# Patient Record
Sex: Female | Born: 1955 | Race: White | Hispanic: No | Marital: Married | Smoking: Never smoker
Health system: Southern US, Community
[De-identification: ages and names within clinical notes are randomized; demographics above are authoritative.]

## PROBLEM LIST (undated history)

## (undated) DIAGNOSIS — E785 Hyperlipidemia, unspecified: Secondary | ICD-10-CM

## (undated) DIAGNOSIS — R51 Headache: Secondary | ICD-10-CM

## (undated) DIAGNOSIS — K649 Unspecified hemorrhoids: Secondary | ICD-10-CM

## (undated) DIAGNOSIS — A770 Spotted fever due to Rickettsia rickettsii: Secondary | ICD-10-CM

## (undated) DIAGNOSIS — R519 Headache, unspecified: Secondary | ICD-10-CM

## (undated) DIAGNOSIS — G4700 Insomnia, unspecified: Secondary | ICD-10-CM

## (undated) DIAGNOSIS — R7309 Other abnormal glucose: Secondary | ICD-10-CM

## (undated) DIAGNOSIS — I10 Essential (primary) hypertension: Secondary | ICD-10-CM

## (undated) DIAGNOSIS — T7840XA Allergy, unspecified, initial encounter: Secondary | ICD-10-CM

## (undated) DIAGNOSIS — I472 Ventricular tachycardia: Secondary | ICD-10-CM

## (undated) HISTORY — DX: Hyperlipidemia, unspecified: E78.5

## (undated) HISTORY — DX: Essential (primary) hypertension: I10

## (undated) HISTORY — DX: Headache, unspecified: R51.9

## (undated) HISTORY — DX: Insomnia, unspecified: G47.00

## (undated) HISTORY — DX: Allergy, unspecified, initial encounter: T78.40XA

## (undated) HISTORY — DX: Headache: R51

## (undated) HISTORY — DX: Ventricular tachycardia: I47.2

## (undated) HISTORY — DX: Other abnormal glucose: R73.09

## (undated) HISTORY — DX: Unspecified hemorrhoids: K64.9

## (undated) HISTORY — DX: Spotted fever due to Rickettsia rickettsii: A77.0

## (undated) HISTORY — PX: COLONOSCOPY: SHX174

---

## 1985-05-08 HISTORY — PX: TUBAL LIGATION: SHX77

## 2004-10-27 ENCOUNTER — Ambulatory Visit: Payer: Self-pay

## 2005-10-19 ENCOUNTER — Ambulatory Visit: Payer: Self-pay | Admitting: Family Medicine

## 2006-04-26 ENCOUNTER — Ambulatory Visit: Payer: Self-pay

## 2006-05-08 LAB — HM COLONOSCOPY: HM Colonoscopy: NORMAL

## 2006-10-29 DIAGNOSIS — J309 Allergic rhinitis, unspecified: Secondary | ICD-10-CM | POA: Insufficient documentation

## 2006-10-29 DIAGNOSIS — I10 Essential (primary) hypertension: Secondary | ICD-10-CM | POA: Insufficient documentation

## 2006-10-29 DIAGNOSIS — E785 Hyperlipidemia, unspecified: Secondary | ICD-10-CM | POA: Insufficient documentation

## 2006-11-06 ENCOUNTER — Ambulatory Visit: Payer: Self-pay | Admitting: Family Medicine

## 2006-11-27 ENCOUNTER — Ambulatory Visit: Payer: Self-pay | Admitting: Gastroenterology

## 2009-03-22 ENCOUNTER — Ambulatory Visit: Payer: Self-pay | Admitting: Family Medicine

## 2011-11-06 ENCOUNTER — Ambulatory Visit: Payer: Self-pay | Admitting: Family Medicine

## 2011-11-07 LAB — HM MAMMOGRAPHY: HM Mammogram: NORMAL

## 2011-11-15 LAB — HM PAP SMEAR: HM Pap smear: NORMAL

## 2013-07-03 LAB — LIPID PANEL
Cholesterol: 200 mg/dL (ref 0–200)
HDL: 58 mg/dL (ref 35–70)
LDL Cholesterol: 124 mg/dL
Triglycerides: 89 mg/dL (ref 40–160)

## 2013-09-24 LAB — HEMOGLOBIN A1C: Hgb A1c MFr Bld: 5.8 % (ref 4.0–6.0)

## 2014-03-16 ENCOUNTER — Encounter: Payer: Self-pay | Admitting: *Deleted

## 2014-03-24 ENCOUNTER — Encounter: Payer: Self-pay | Admitting: General Surgery

## 2014-03-24 ENCOUNTER — Ambulatory Visit (INDEPENDENT_AMBULATORY_CARE_PROVIDER_SITE_OTHER): Payer: BC Managed Care – PPO | Admitting: General Surgery

## 2014-03-24 VITALS — BP 168/86 | HR 68 | Resp 12 | Ht 62.0 in | Wt 132.0 lb

## 2014-03-24 DIAGNOSIS — R599 Enlarged lymph nodes, unspecified: Secondary | ICD-10-CM

## 2014-03-24 DIAGNOSIS — R591 Generalized enlarged lymph nodes: Secondary | ICD-10-CM

## 2014-03-24 NOTE — Patient Instructions (Signed)
Patient to return in 6 weeks for follow up. The patient is aware to call back for any questions or concerns.  

## 2014-03-24 NOTE — Progress Notes (Signed)
Patient ID: Jodi Fernandez, female   DOB: 1955/12/15, 58 y.o.   MRN: [id number]  Chief Complaint  Patient presents with  . Other    lymphadenopathy axillary     HPI Jodi KaufmannJoan W Fernandez is a 58 y.o. female who presents for an evaluation of bilateral axillary lymphadenopathy. The patient states she noticed a hard knot in the left axilla approximately the middle of October. She states she went to her PCP the following week and was put on Antibiotics (Amoxicillin). She has completed the course of antibiotics and noticed a right axillary knot. The right knot was noticed on 03/15/14. She states the area will start to hurt and that's when she would notice the area. The left knot has decreased in size some.   HPI  Past Medical History  Diagnosis Date  . Hyperlipidemia   . Hypertension   . Hemorrhoids     Past Surgical History  Procedure Laterality Date  . Tubal ligation  1987    Family History  Problem Relation Age of Onset  . Heart disease Mother   . Diabetes Mother   . Alcohol abuse Father   . Heart disease Father   . Heart disease Sister     Social History History  Substance Use Topics  . Smoking status: Never Smoker   . Smokeless tobacco: Never Used  . Alcohol Use: No    No Known Allergies  Current Outpatient Prescriptions  Medication Sig Dispense Refill  . aspirin 325 MG tablet Take 325 mg by mouth daily.    . Coenzyme Q10 (CO Q 10 PO) Take 300 mg by mouth daily.    Marland Kitchen. ibuprofen (ADVIL,MOTRIN) 200 MG tablet Take 200 mg by mouth every 6 (six) hours as needed.    . Multiple Vitamins-Minerals (AIRBORNE PO) Take by mouth.    . Nutritional Supplements (JUICE PLUS FIBRE PO) Take by mouth daily.    . rosuvastatin (CRESTOR) 20 MG tablet 1/2 tab by mouth 3 times weekly    . telmisartan-hydrochlorothiazide (MICARDIS HCT) 40-12.5 MG per tablet Take 1 tablet by mouth daily.     No current facility-administered medications for this visit.    Review of Systems Review of Systems   Constitutional: Negative.   Respiratory: Negative.   Cardiovascular: Negative.     Blood pressure 168/86, pulse 68, resp. rate 12, height 5\' 2"  (1.575 m), weight 132 lb (59.875 kg).  Physical Exam Physical Exam  Constitutional: She is oriented to person, place, and time. She appears well-developed and well-nourished.  Neck: Neck supple. No thyromegaly present.  Cardiovascular: Normal rate, regular rhythm and normal heart sounds.   No murmur heard. Pulmonary/Chest: Effort normal and breath sounds normal.  Abdominal: Soft. Normal appearance and bowel sounds are normal. There is no hepatosplenomegaly. There is no tenderness. No hernia.  Lymphadenopathy:    She has no cervical adenopathy.    She has axillary adenopathy (right axillary node 1.5 to 2 cm).  Neurological: She is alert and oriented to person, place, and time.  Skin: Skin is warm and dry.    Data Reviewed    Assessment    Likely inflammatory nodes.      Plan    Recheck in 4-6 weeks. Pt to call if there is any redness, increased swelling , more pain.       Charmin Aguiniga G 03/25/2014, 6:48 AM

## 2014-03-25 ENCOUNTER — Encounter: Payer: Self-pay | Admitting: General Surgery

## 2014-03-30 ENCOUNTER — Telehealth: Payer: Self-pay

## 2014-03-30 NOTE — Telephone Encounter (Signed)
Patient called concerned about another lymph node swelling in her left axillary region. She says that this started on Friday evening. She states that she can still feel the one in her right side although it has reduced in size. She wants to know if she needs to be seen sooner than January or if she may need some blood work prior to being seen. She is asking for a call back about this.

## 2014-03-30 NOTE — Telephone Encounter (Signed)
Need to see her. Please schedule appt for Wed 11/25

## 2014-04-01 ENCOUNTER — Encounter: Payer: Self-pay | Admitting: General Surgery

## 2014-04-01 ENCOUNTER — Ambulatory Visit (INDEPENDENT_AMBULATORY_CARE_PROVIDER_SITE_OTHER): Payer: BC Managed Care – PPO | Admitting: General Surgery

## 2014-04-01 VITALS — BP 138/84 | HR 72 | Resp 12 | Ht 62.0 in | Wt 131.0 lb

## 2014-04-01 DIAGNOSIS — L729 Follicular cyst of the skin and subcutaneous tissue, unspecified: Secondary | ICD-10-CM

## 2014-04-01 NOTE — Patient Instructions (Addendum)
Continue self breast exams. Call office for any new breast issues or concerns. The patient is aware to call back for any questions or concerns. Redness, painful or swelling of the axillary areas.

## 2014-04-01 NOTE — Progress Notes (Signed)
Patient ID: Jodi KaufmannJoan W Elting, female   DOB: February 09, 1956, 58 y.o.   MRN: [id number]  Chief Complaint  Patient presents with  . Follow-up    left axillary area swollen    HPI Jodi KaufmannJoan W Gable is a 58 y.o. female who presents for an evaluation of left axillary lymphadenopathy. Patient states she still feels another knot near the other one that she just had in that area.    HPI  Past Medical History  Diagnosis Date  . Hyperlipidemia   . Hypertension   . Hemorrhoids     Past Surgical History  Procedure Laterality Date  . Tubal ligation  1987  . Colonoscopy      Family History  Problem Relation Age of Onset  . Heart disease Mother   . Diabetes Mother   . Alcohol abuse Father   . Heart disease Father   . Heart disease Sister     Social History History  Substance Use Topics  . Smoking status: Never Smoker   . Smokeless tobacco: Never Used  . Alcohol Use: No    No Known Allergies  Current Outpatient Prescriptions  Medication Sig Dispense Refill  . aspirin 325 MG tablet Take 325 mg by mouth daily.    . Coenzyme Q10 (CO Q 10 PO) Take 300 mg by mouth daily.    Marland Kitchen. ibuprofen (ADVIL,MOTRIN) 200 MG tablet Take 200 mg by mouth every 6 (six) hours as needed.    . Multiple Vitamins-Minerals (AIRBORNE PO) Take by mouth.    . Nutritional Supplements (JUICE PLUS FIBRE PO) Take by mouth daily.    . rosuvastatin (CRESTOR) 20 MG tablet 1/2 tab by mouth 3 times weekly    . telmisartan-hydrochlorothiazide (MICARDIS HCT) 40-12.5 MG per tablet Take 1 tablet by mouth daily.     No current facility-administered medications for this visit.    Review of Systems Review of Systems  Constitutional: Negative.   Respiratory: Negative.   Cardiovascular: Negative.     Blood pressure 138/84, pulse 72, resp. rate 12, height 5\' 2"  (1.575 m), weight 131 lb (59.421 kg).  Physical Exam Physical Exam  Constitutional: She is oriented to person, place, and time. She appears well-developed and  well-nourished.  Lymphadenopathy:    She has no axillary adenopathy.  Neurological: She is alert and oriented to person, place, and time.  Skin: Skin is warm, dry and intact.  6 mm skin cyst in left axillary crease. Tiny skin cyst right axilla.   No palpable nodes in neck or axilla today Data Reviewed none  Assessment    Bilateral axillary skin cyst. Pt advised on this. Reassured that there are no palpable nodes.    Plan    Follow up in January as scheduled. Advised to call if redness, pain or swelling develops at the skin cyst area in axilla       PCP:  Charlott HollerSowles, Krichna   Shalah Estelle G 04/01/2014, 9:45 AM

## 2014-04-11 DIAGNOSIS — Z8619 Personal history of other infectious and parasitic diseases: Secondary | ICD-10-CM | POA: Insufficient documentation

## 2014-05-14 ENCOUNTER — Ambulatory Visit: Payer: BC Managed Care – PPO | Admitting: General Surgery

## 2014-05-14 ENCOUNTER — Encounter: Payer: Self-pay | Admitting: *Deleted

## 2014-11-11 ENCOUNTER — Encounter: Payer: Self-pay | Admitting: Family Medicine

## 2014-11-11 DIAGNOSIS — G4700 Insomnia, unspecified: Secondary | ICD-10-CM | POA: Insufficient documentation

## 2014-11-11 DIAGNOSIS — IMO0002 Reserved for concepts with insufficient information to code with codable children: Secondary | ICD-10-CM | POA: Insufficient documentation

## 2014-11-11 DIAGNOSIS — F411 Generalized anxiety disorder: Secondary | ICD-10-CM | POA: Insufficient documentation

## 2014-11-11 DIAGNOSIS — M7060 Trochanteric bursitis, unspecified hip: Secondary | ICD-10-CM | POA: Insufficient documentation

## 2014-11-11 DIAGNOSIS — R51 Headache: Secondary | ICD-10-CM

## 2014-11-11 DIAGNOSIS — R590 Localized enlarged lymph nodes: Secondary | ICD-10-CM | POA: Insufficient documentation

## 2014-11-11 DIAGNOSIS — R519 Headache, unspecified: Secondary | ICD-10-CM | POA: Insufficient documentation

## 2014-11-12 ENCOUNTER — Encounter: Payer: Self-pay | Admitting: Family Medicine

## 2014-11-12 ENCOUNTER — Other Ambulatory Visit: Payer: Self-pay | Admitting: Family Medicine

## 2014-11-12 ENCOUNTER — Ambulatory Visit (INDEPENDENT_AMBULATORY_CARE_PROVIDER_SITE_OTHER): Payer: BC Managed Care – PPO | Admitting: Family Medicine

## 2014-11-12 VITALS — BP 138/84 | HR 86 | Temp 98.0°F | Resp 14 | Ht 62.0 in | Wt 137.4 lb

## 2014-11-12 DIAGNOSIS — I10 Essential (primary) hypertension: Secondary | ICD-10-CM

## 2014-11-12 DIAGNOSIS — Z1239 Encounter for other screening for malignant neoplasm of breast: Secondary | ICD-10-CM

## 2014-11-12 DIAGNOSIS — Z114 Encounter for screening for human immunodeficiency virus [HIV]: Secondary | ICD-10-CM | POA: Diagnosis not present

## 2014-11-12 DIAGNOSIS — E785 Hyperlipidemia, unspecified: Secondary | ICD-10-CM

## 2014-11-12 DIAGNOSIS — F419 Anxiety disorder, unspecified: Secondary | ICD-10-CM

## 2014-11-12 DIAGNOSIS — R739 Hyperglycemia, unspecified: Secondary | ICD-10-CM

## 2014-11-12 DIAGNOSIS — G4700 Insomnia, unspecified: Secondary | ICD-10-CM | POA: Diagnosis not present

## 2014-11-12 MED ORDER — TELMISARTAN 20 MG PO TABS: 20.0000 mg | ORAL_TABLET | Freq: Every day | ORAL | Status: DC

## 2014-11-12 MED ORDER — ASPIRIN EC 81 MG PO TBEC: 81.0000 mg | DELAYED_RELEASE_TABLET | Freq: Every day | ORAL | Status: DC

## 2014-11-12 MED ORDER — ROSUVASTATIN CALCIUM 5 MG PO TABS: 5.0000 mg | ORAL_TABLET | Freq: Every day | ORAL | Status: DC

## 2014-11-12 NOTE — Progress Notes (Signed)
Name: Jodi Fernandez   MRN: [id number]    DOB: June 10, 1955   Date:11/12/2014       Progress Note  Subjective  Chief Complaint  Chief Complaint  Patient presents with  . Hypertension    headaches  . Hyperlipidemia    cramps in leg  . Tick Removal    but no syptoms    HPI  HTN: taking half dose of Micardis/HCTZ and bp has been at goal, sometimes dropping to below 100/70 and gets dizzy when walking. BP usually below 125/85. She denies chest pain, or palpitation. She did not take medication this am, she is fasting for labs.   Hyperlipidemia: she has been taking 10mg  Crestor a few days weekly, and CoQ 10 supplements. She denies myalgias. She noticed some calf cramps at night this Summer. Advised to stretch before bed and stay hydrated.  Tick exposure: she found three ticks on her on April 27th, no symptoms, feeling well.   Insomnia: she retired in January 2016, she is taking care of two grandchildren a 472 and 61 year old. She has been sleeping better, sometimes she wakes up at night, but mostly because she feels hot or because her husband asks for assistance.  Anxiety: she still struggles with anxiety, she worries about her grandchildren, afraid they will get hurt, also does not like being a passenger in a car.   Patient Active Problem List   Diagnosis Date Noted  . Anxiety 11/11/2014  . Insomnia, persistent 11/11/2014  . Dyspareunia 11/11/2014  . Generalized headache 11/11/2014  . Bursitis, trochanteric 11/11/2014  . History of Rocky [place] spotted fever 04/11/2014  . Dyslipidemia 10/29/2006  . Benign hypertension 10/29/2006  . Allergic rhinitis 10/29/2006    Past Surgical History  Procedure Laterality Date  . Tubal ligation  1987  . Colonoscopy      Family History  Problem Relation Age of Onset  . Heart disease Mother   . Diabetes Mother   . Alcohol abuse Father   . Heart disease Father   . Heart disease Sister   . Hypertension Brother   . Hypertension Brother   .  Hypertension Brother   . Hypertension Brother     History   Social History  . Marital Status: Married    Spouse Name: N/A  . Number of Children: N/A  . Years of Education: N/A   Occupational History  . Not on file.   Social History Main Topics  . Smoking status: Never Smoker   . Smokeless tobacco: Never Used  . Alcohol Use: No  . Drug Use: No  . Sexual Activity:    Partners: Female   Other Topics Concern  . Not on file   Social History Narrative     Current outpatient prescriptions:  .  Coenzyme Q10 (CO Q 10 PO), Take 300 mg by mouth daily., Disp: , Rfl:  .  ibuprofen (ADVIL,MOTRIN) 200 MG tablet, Take 200 mg by mouth every 6 (six) hours as needed., Disp: , Rfl:  .  Multiple Vitamins-Minerals (AIRBORNE PO), Take by mouth., Disp: , Rfl:  .  Nutritional Supplements (JUICE PLUS FIBRE PO), Take by mouth daily., Disp: , Rfl:  .  aspirin EC 81 MG tablet, Take 1 tablet (81 mg total) by mouth daily., Disp: 30 tablet, Rfl: 5 .  rosuvastatin (CRESTOR) 5 MG tablet, Take 1 tablet (5 mg total) by mouth daily., Disp: 30 tablet, Rfl: 5 .  telmisartan (MICARDIS) 20 MG tablet, Take 1 tablet (20 mg total)  by mouth daily., Disp: 30 tablet, Rfl: 5  No Known Allergies   ROS  Constitutional: Negative for fever or weight change.  Respiratory: Negative for cough and shortness of breath.   Cardiovascular: Negative for chest pain or palpitations.  Gastrointestinal: Negative for abdominal pain, no bowel changes.  Musculoskeletal: Negative for gait problem or joint swelling.  Skin: Negative for rash.  Neurological: Negative for dizziness or headache.  No other specific complaints in a complete review of systems (except as listed in HPI above).  Objective  Filed Vitals:   11/12/14 0814  BP: 138/84  Pulse: 86  Temp: 98 F (36.7 C)  TempSrc: Oral  Resp: 14  Height:  (1.575 m)  Weight: 137 lb 6.4 oz (62.324 kg)  SpO2: 96%    Body mass index is 25.12 kg/(m^2).  Physical  Exam  Constitutional: Patient appears well-developed and well-nourished. No distress.  Eyes:  No scleral icterus. PERL Neck: Normal range of motion. Neck supple. Cardiovascular: Normal rate, regular rhythm and normal heart sounds.  No murmur heard. No BLE edema. Pulmonary/Chest: Effort normal and breath sounds normal. No respiratory distress. Abdominal: Soft.  There is no tenderness. Psychiatric: Patient has a normal mood and affect. behavior is normal. Judgment and thought content normal.    PHQ2/9: Depression screen PHQ 2/9 11/12/2014  Decreased Interest 0  Down, Depressed, Hopeless 0  PHQ - 2 Score 0     Fall Risk: Fall Risk  11/12/2014  Falls in the past year? No     Assessment & Plan  1. Benign hypertension   Because of low blood pressure readings at home, we will change dose of medication and continue to monitor at home - telmisartan (MICARDIS) 20 MG tablet; Take 1 tablet (20 mg total) by mouth daily.  Dispense: 30 tablet; Refill: 5 - Comprehensive Metabolic Panel (CMET)  2. Dyslipidemia  Recheck labs, change to  daily instead of half three times weekly  - rosuvastatin (CRESTOR) 5 MG tablet; Take 1 tablet (5 mg total) by mouth daily.  Dispense: 30 tablet; Refill: 5 - Lipid Profile  3. Insomnia, persistent Stable, discussed Melatonin   4. Anxiety Stable, she does not want medications at this time  5. Encounter for screening for HIV  - HIV antibody (with reflex)  7. Breast cancer screening  - MM Digital Screening; Future  7. Hyperglycemia Glucose at home, with husband machine was 120 fasting, we will check hgbA1C  -hgbA1C

## 2014-11-13 LAB — COMPREHENSIVE METABOLIC PANEL
ALT: 9 IU/L (ref 0–32)
AST: 21 IU/L (ref 0–40)
Albumin/Globulin Ratio: 1.9 (ref 1.1–2.5)
Albumin: 4.8 g/dL (ref 3.6–4.8)
Alkaline Phosphatase: 90 IU/L (ref 39–117)
BUN/Creatinine Ratio: 17 (ref 11–26)
BUN: 12 mg/dL (ref 8–27)
Bilirubin Total: 0.4 mg/dL (ref 0.0–1.2)
CO2: 25 mmol/L (ref 18–29)
Calcium: 9.6 mg/dL (ref 8.7–10.3)
Chloride: 102 mmol/L (ref 97–108)
Creatinine, Ser: 0.7 mg/dL (ref 0.57–1.00)
GFR calc Af Amer: 109 mL/min/{1.73_m2} (ref >59)
GFR calc non Af Amer: 94 mL/min/{1.73_m2} (ref >59)
Globulin, Total: 2.5 g/dL (ref 1.5–4.5)
Glucose: 117 mg/dL — ABNORMAL HIGH (ref 65–99)
Potassium: 4.1 mmol/L (ref 3.5–5.2)
Sodium: 142 mmol/L (ref 134–144)
Total Protein: 7.3 g/dL (ref 6.0–8.5)

## 2014-11-13 LAB — HEMOGLOBIN A1C
Est. average glucose Bld gHb Est-mCnc: 120 mg/dL
Hgb A1c MFr Bld: 5.8 % — ABNORMAL HIGH (ref 4.8–5.6)

## 2014-11-13 LAB — LIPID PANEL
Chol/HDL Ratio: 4.3 ratio units (ref 0.0–4.4)
Cholesterol, Total: 250 mg/dL — ABNORMAL HIGH (ref 100–199)
HDL: 58 mg/dL (ref >39)
LDL Calculated: 167 mg/dL — ABNORMAL HIGH (ref 0–99)
Triglycerides: 124 mg/dL (ref 0–149)
VLDL Cholesterol Cal: 25 mg/dL (ref 5–40)

## 2014-11-13 LAB — HIV ANTIBODY (ROUTINE TESTING W REFLEX): HIV Screen 4th Generation wRfx: NONREACTIVE

## 2015-02-15 ENCOUNTER — Ambulatory Visit (INDEPENDENT_AMBULATORY_CARE_PROVIDER_SITE_OTHER): Payer: BC Managed Care – PPO | Admitting: Family Medicine

## 2015-02-15 ENCOUNTER — Encounter: Payer: Self-pay | Admitting: Family Medicine

## 2015-02-15 VITALS — BP 132/82 | HR 84 | Temp 98.1°F | Resp 14 | Ht 62.0 in | Wt 139.3 lb

## 2015-02-15 DIAGNOSIS — Z719 Counseling, unspecified: Secondary | ICD-10-CM

## 2015-02-15 DIAGNOSIS — Z7189 Other specified counseling: Secondary | ICD-10-CM | POA: Diagnosis not present

## 2015-02-15 DIAGNOSIS — Z23 Encounter for immunization: Secondary | ICD-10-CM | POA: Diagnosis not present

## 2015-02-15 DIAGNOSIS — Z0000 Encounter for general adult medical examination without abnormal findings: Secondary | ICD-10-CM | POA: Diagnosis not present

## 2015-02-15 DIAGNOSIS — Z1239 Encounter for other screening for malignant neoplasm of breast: Secondary | ICD-10-CM

## 2015-02-15 DIAGNOSIS — Z01419 Encounter for gynecological examination (general) (routine) without abnormal findings: Secondary | ICD-10-CM

## 2015-02-15 DIAGNOSIS — Z124 Encounter for screening for malignant neoplasm of cervix: Secondary | ICD-10-CM | POA: Diagnosis not present

## 2015-02-15 NOTE — Progress Notes (Addendum)
Name: Jodi Fernandez   MRN: [id number]    DOB: April 07, 1956   Date:02/15/2015       Progress Note  Subjective  Chief Complaint  Chief Complaint  Patient presents with  . Annual Exam    HPI  Well Woman Exam: patient is feeling well, hot flashes are mild and intermittent. She has vaginal dryness and some dyspareunia. She also has occasional urinary stress incontinence - but only with severe coughing spells. She is not working on Kegel exercises.   Patient Active Problem List   Diagnosis Date Noted  . Anxiety 11/11/2014  . Insomnia, persistent 11/11/2014  . Dyspareunia 11/11/2014  . Generalized headache 11/11/2014  . Bursitis, trochanteric 11/11/2014  . History of Rocky Mountain spotted fever 04/11/2014  . Dyslipidemia 10/29/2006  . Benign hypertension 10/29/2006  . Allergic rhinitis 10/29/2006    Past Surgical History  Procedure Laterality Date  . Tubal ligation  1987  . Colonoscopy      Family History  Problem Relation Age of Onset  . Heart disease Mother   . Diabetes Mother   . Alcohol abuse Father   . Heart disease Father   . Heart disease Sister   . Hypertension Brother   . Hypertension Brother   . Hypertension Brother   . Hypertension Brother     Social History   Social History  . Marital Status: Married    Spouse Name: N/A  . Number of Children: N/A  . Years of Education: N/A   Occupational History  . Not on file.   Social History Main Topics  . Smoking status: Never Smoker   . Smokeless tobacco: Never Used  . Alcohol Use: No  . Drug Use: No  . Sexual Activity:    Partners: Female   Other Topics Concern  . Not on file   Social History Narrative     Current outpatient prescriptions:  .  aspirin EC 81 MG tablet, Take 1 tablet (81 mg total) by mouth daily., Disp: 30 tablet, Rfl: 5 .  Coenzyme Q10 (CO Q 10 PO), Take 300 mg by mouth daily., Disp: , Rfl:  .  ibuprofen (ADVIL,MOTRIN) 200 MG tablet, Take 200 mg by mouth every 6 (six) hours as  needed., Disp: , Rfl:  .  Multiple Vitamins-Minerals (AIRBORNE PO), Take by mouth., Disp: , Rfl:  .  Nutritional Supplements (JUICE PLUS FIBRE PO), Take by mouth daily., Disp: , Rfl:  .  rosuvastatin (CRESTOR) 5 MG tablet, Take 1 tablet (5 mg total) by mouth daily., Disp: 30 tablet, Rfl: 5 .  telmisartan (MICARDIS) 20 MG tablet, Take 1 tablet (20 mg total) by mouth daily., Disp: 30 tablet, Rfl: 5  No Known Allergies   ROS  Constitutional: Negative for fever or weight change.  Respiratory: Negative for cough and shortness of breath.   Cardiovascular: Negative for chest pain or palpitations.  Gastrointestinal: Negative for abdominal pain, no bowel changes.  Musculoskeletal: Negative for gait problem or joint swelling.  Skin: Negative for rash.  Neurological: Negative for dizziness or headache.  No other specific complaints in a complete review of systems (except as listed in HPI above).  Objective  Filed Vitals:   02/15/15 0857  BP: 132/82  Pulse: 84  Temp: 98.1 F (36.7 C)  TempSrc: Oral  Resp: 14  Height:  (1.575 m)  Weight: 139 lb 4.8 oz (63.186 kg)  SpO2: 96%    Body mass index is 25.47 kg/(m^2).  Physical Exam  Constitutional: Patient appears well-developed  and well-nourished. No distress.  HENT: Head: Normocephalic and atraumatic. Ears: B TMs ok, no erythema or effusion; Nose: Nose normal. Mouth/Throat: Oropharynx is clear and moist. No oropharyngeal exudate.  Eyes: Conjunctivae and EOM are normal. Pupils are equal, round, and reactive to light. No scleral icterus.  Neck: Normal range of motion. Neck supple. No JVD present. No thyromegaly present.  Cardiovascular: Normal rate, regular rhythm and normal heart sounds.  No murmur heard. No BLE edema. Pulmonary/Chest: Effort normal and breath sounds normal. No respiratory distress. Abdominal: Soft. Bowel sounds are normal, no distension. There is no tenderness. no masses Breast: no lumps or masses, no nipple  discharge or rashes FEMALE GENITALIA:  External genitalia normal External urethra normal Vaginal atrophy Cervix normal without discharge or lesions Bimanual exam normal without masses RECTAL: not done Musculoskeletal: Normal range of motion, no joint effusions. No gross deformities Neurological: he is alert and oriented to person, place, and time. No cranial nerve deficit. Coordination, balance, strength, speech and gait are normal.  Skin: Skin is warm and dry. No rash noted. No erythema.  Psychiatric: Patient has a normal mood and affect. behavior is normal. Judgment and thought content normal.  PHQ2/9: Depression screen [facility] 2/9 02/15/2015 11/12/2014  Decreased Interest 0 0  Down, Depressed, Hopeless 1 0  PHQ - 2 Score 1 0    Fall Risk: Fall Risk  02/15/2015 11/12/2014  Falls in the past year? No No    Functional Status Survey: Is the patient deaf or have difficulty hearing?: No Does the patient have difficulty seeing, even when wearing glasses/contacts?: Yes (glases reading) Does the patient have difficulty concentrating, remembering, or making decisions?: No Does the patient have difficulty walking or climbing stairs?: No Does the patient have difficulty dressing or bathing?: No Does the patient have difficulty doing errands alone such as visiting a doctor's office or shopping?: No    Assessment & Plan  1. Well woman exam   2. Needs flu shot  - Flu Vaccine QUAD 36+ mos PF IM (Fluarix & Fluzone Quad PF)  3. Health counseling  Discussed importance of 150 minutes of physical activity weekly, eat two servings of fish weekly, eat one serving of tree nuts ( cashews, pistachios, pecans, almonds.Marland Kitchen) every other day, eat 6 servings of fruit/vegetables daily and drink plenty of water and avoid sweet beverages.   4. Need for shingles vaccine  - Varicella-zoster vaccine subcutaneous  5. Breast cancer screening  She will stop at [facility] to schedule mammogram today  6.  Cervical cancer screening  - PapLb, HPV, rfx16/18

## 2015-02-15 NOTE — Addendum Note (Signed)
Addended by: Alba Cory F on: 02/15/2015 10:03 AM   Modules accepted: Orders, SmartSet

## 2015-02-18 ENCOUNTER — Ambulatory Visit
Admission: RE | Admit: 2015-02-18 | Discharge: 2015-02-18 | Disposition: A | Discharge status: Home / Self Care | Payer: BC Managed Care – PPO | Source: Ambulatory Visit | Attending: Family Medicine | Admitting: Family Medicine

## 2015-02-18 DIAGNOSIS — Z1239 Encounter for other screening for malignant neoplasm of breast: Secondary | ICD-10-CM

## 2015-02-18 DIAGNOSIS — Z1231 Encounter for screening mammogram for malignant neoplasm of breast: Secondary | ICD-10-CM | POA: Diagnosis not present

## 2015-02-18 LAB — PAPLB, HPV, RFX16/18: PAP Smear Comment: 0

## 2015-05-17 ENCOUNTER — Ambulatory Visit: Payer: BC Managed Care – PPO | Admitting: Family Medicine

## 2015-05-20 ENCOUNTER — Encounter: Payer: Self-pay | Admitting: Family Medicine

## 2015-05-20 ENCOUNTER — Ambulatory Visit (INDEPENDENT_AMBULATORY_CARE_PROVIDER_SITE_OTHER): Payer: BC Managed Care – PPO | Admitting: Family Medicine

## 2015-05-20 VITALS — BP 146/84 | HR 85 | Temp 98.7°F | Resp 16 | Wt 141.0 lb

## 2015-05-20 DIAGNOSIS — R7303 Prediabetes: Secondary | ICD-10-CM

## 2015-05-20 DIAGNOSIS — G4700 Insomnia, unspecified: Secondary | ICD-10-CM | POA: Diagnosis not present

## 2015-05-20 DIAGNOSIS — I10 Essential (primary) hypertension: Secondary | ICD-10-CM | POA: Diagnosis not present

## 2015-05-20 DIAGNOSIS — E785 Hyperlipidemia, unspecified: Secondary | ICD-10-CM

## 2015-05-20 DIAGNOSIS — F411 Generalized anxiety disorder: Secondary | ICD-10-CM | POA: Diagnosis not present

## 2015-05-20 MED ORDER — ROSUVASTATIN CALCIUM 5 MG PO TABS: 5.0000 mg | ORAL_TABLET | Freq: Every day | ORAL | Status: DC

## 2015-05-20 MED ORDER — TELMISARTAN-HCTZ 40-12.5 MG PO TABS: 1.0000 | ORAL_TABLET | Freq: Every day | ORAL | Status: DC

## 2015-05-20 NOTE — Progress Notes (Signed)
Name: Jodi Fernandez   MRN: [id number]    DOB: 02-02-1956   Date:05/20/2015       Progress Note  Subjective  Chief Complaint  Chief Complaint  Patient presents with  . Hypertension    Patient has been checking her BP daily and brought her log. She stated that it has been running high.   . Hyperlipidemia  . Medication Management    Patient is unsure if her BP meds need to be changed again.     HPI  HTN: Micardis was changed to a lower dose in July 2016 because she was having hypotension, however over the past few months bp has been very high, 121-158 ( average 130's )/ 81-103 average 90's/. She has changed her diet lately - not as healthy and has gained weight.  She denies chest pain, no palpitation, HR can go down to 60's.   Hyperlipidemia: she has been taking 5mg  Crestor , and CoQ 10 supplements. She denies myalgias. No cramps.    Insomnia: she retired in January 2016, she is taking care of two grandchildren a 62 and 33 year old. She still has problems sleeping, she wakes up at night, but she can fall back asleep. Explained likely from anxiety, but she does not want medication   Anxiety: she still struggles with anxiety, she worries about her grandchildren, afraid they will get hurt, also does not like being a passenger in a car. She also gets nervous when driving a different. Gets annoyed with husband because he works from home at times and is not very organized.   Patient Active Problem List   Diagnosis Date Noted  . GAD (generalized anxiety disorder) 11/11/2014  . Insomnia, persistent 11/11/2014  . Dyspareunia 11/11/2014  . Generalized headache 11/11/2014  . Bursitis, trochanteric 11/11/2014  . History of Rocky [place] spotted fever 04/11/2014  . Dyslipidemia 10/29/2006  . Benign hypertension 10/29/2006  . Allergic rhinitis 10/29/2006    Past Surgical History  Procedure Laterality Date  . Tubal ligation  1987  . Colonoscopy      Family History  Problem Relation Age of  Onset  . Heart disease Mother   . Diabetes Mother   . Alcohol abuse Father   . Heart disease Father   . Heart disease Sister   . Hypertension Brother   . Hypertension Brother   . Hypertension Brother   . Hypertension Brother   . Breast cancer Paternal Aunt 60    Social History   Social History  . Marital Status: Married    Spouse Name: N/A  . Number of Children: N/A  . Years of Education: N/A   Occupational History  . Not on file.   Social History Main Topics  . Smoking status: Never Smoker   . Smokeless tobacco: Never Used  . Alcohol Use: No  . Drug Use: No  . Sexual Activity:    Partners: Female   Other Topics Concern  . Not on file   Social History Narrative     Current outpatient prescriptions:  .  aspirin EC 81 MG tablet, Take 1 tablet (81 mg total) by mouth daily., Disp: 30 tablet, Rfl: 5 .  Coenzyme Q10 (CO Q 10 PO), Take 300 mg by mouth daily., Disp: , Rfl:  .  Multiple Vitamins-Minerals (AIRBORNE PO), Take by mouth., Disp: , Rfl:  .  Nutritional Supplements (JUICE PLUS FIBRE PO), Take by mouth daily., Disp: , Rfl:  .  rosuvastatin (CRESTOR) 5 MG tablet, Take 1 tablet (  5 mg total) by mouth daily., Disp: 30 tablet, Rfl: 5 .  telmisartan (MICARDIS) 20 MG tablet, Take 1 tablet (20 mg total) by mouth daily., Disp: 30 tablet, Rfl: 5 .  ibuprofen (ADVIL,MOTRIN) 200 MG tablet, Take 200 mg by mouth every 6 (six) hours as needed. Reported on 05/20/2015, Disp: , Rfl:   No Known Allergies   ROS  Constitutional: Negative for fever or weight change.  Respiratory: Negative for cough and shortness of breath.   Cardiovascular: Negative for chest pain or palpitations.  Gastrointestinal: Negative for abdominal pain, no bowel changes.  Musculoskeletal: Negative for gait problem or joint swelling.  Skin: Negative for rash.  Neurological: Negative for dizziness or headache.  No other specific complaints in a complete review of systems (except as listed in HPI  above).  Objective  Filed Vitals:   05/20/15 0938  BP: 148/82  Pulse: 85  Temp: 98.7 F (37.1 C)  TempSrc: Oral  Resp: 16  Weight: 141 lb (63.957 kg)  SpO2: 98%    Body mass index is 25.78 kg/(m^2).  Physical Exam  Constitutional: Patient appears well-developed and well-nourished. Obese  No distress.  HEENT: head atraumatic, normocephalic, pupils equal and reactive to light, neck supple, throat within normal limits Cardiovascular: Normal rate, regular rhythm and normal heart sounds.  No murmur heard. No BLE edema. Pulmonary/Chest: Effort normal and breath sounds normal. No respiratory distress. Abdominal: Soft.  There is no tenderness. Psychiatric: Patient has a normal mood and affect. behavior is normal. Judgment and thought content normal.  PHQ2/9: Depression screen [facility] 2/9 05/20/2015 02/15/2015 11/12/2014  Decreased Interest 0 0 0  Down, Depressed, Hopeless 1 1 0  PHQ - 2 Score 1 1 0    Fall Risk: Fall Risk  05/20/2015 02/15/2015 11/12/2014  Falls in the past year? No No No    Functional Status Survey: Is the patient deaf or have difficulty hearing?: No Does the patient have difficulty seeing, even when wearing glasses/contacts?: No Does the patient have difficulty concentrating, remembering, or making decisions?: No Does the patient have difficulty walking or climbing stairs?: No Does the patient have difficulty dressing or bathing?: No Does the patient have difficulty doing errands alone such as visiting a doctor's office or shopping?: No  GAD 7 : Generalized Anxiety Score 05/20/2015  Nervous, Anxious, on Edge 3  Control/stop worrying 2  Worry too much - different things 3  Trouble relaxing 1  Restless 0  Easily annoyed or irritable 3  Afraid - awful might happen 3  Total GAD 7 Score 15  Anxiety Difficulty Somewhat difficult     Assessment & Plan   1. Benign hypertension  Resume higher dose of Micardis/HCTZ, she will send me a log of her bp readings, she  will start exercising again and resume a healthy diet, she will also try taking 40 mg of Micardis for the next 2 weeks followed by one of Micardis HCTZ after that and let me know what works best for her. Return sooner if needed.  - telmisartan-hydrochlorothiazide (MICARDIS HCT) 40-12.5 MG tablet; Take 1 tablet by mouth daily.  Dispense: 30 tablet; Refill: 0  2. Dyslipidemia  - rosuvastatin (CRESTOR) 5 MG tablet; Take 1 tablet (5 mg total) by mouth daily.  Dispense: 30 tablet; Refill: 5  3. Insomnia, persistent  She is a light sleeper and wakes up during the night, but does not want to take medication    4. Prediabetes  Discussed dietary changes and exercise, last hgbA1C 5.8%  5. GAD (generalized anxiety disorder)  She refuses to take medication for anxiety, discussed counseling

## 2015-05-20 NOTE — Patient Instructions (Signed)
Generalized Anxiety Disorder Generalized anxiety disorder (GAD) is a mental disorder. It interferes with life functions, including relationships, work, and school. GAD is different from normal anxiety, which everyone experiences at some point in their lives in response to specific life events and activities. Normal anxiety actually helps us prepare for and get through these life events and activities. Normal anxiety goes away after the event or activity is over.  GAD causes anxiety that is not necessarily related to specific events or activities. It also causes excess anxiety in proportion to specific events or activities. The anxiety associated with GAD is also difficult to control. GAD can vary from mild to severe. People with severe GAD can have intense waves of anxiety with physical symptoms (panic attacks).  SYMPTOMS The anxiety and worry associated with GAD are difficult to control. This anxiety and worry are related to many life events and activities and also occur more days than not for 6 months or longer. People with GAD also have three or more of the following symptoms (one or more in children):  Restlessness.   Fatigue.  Difficulty concentrating.   Irritability.  Muscle tension.  Difficulty sleeping or unsatisfying sleep. DIAGNOSIS GAD is diagnosed through an assessment by your health care provider. Your health care provider will ask you questions aboutyour mood,physical symptoms, and events in your life. Your health care provider may ask you about your medical history and use of alcohol or drugs, including prescription medicines. Your health care provider may also do a physical exam and blood tests. Certain medical conditions and the use of certain substances can cause symptoms similar to those associated with GAD. Your health care provider may refer you to a mental health specialist for further evaluation. TREATMENT The following therapies are usually used to treat GAD:    Medication. Antidepressant medication usually is prescribed for long-term daily control. Antianxiety medicines may be added in severe cases, especially when panic attacks occur.   Talk therapy (psychotherapy). Certain types of talk therapy can be helpful in treating GAD by providing support, education, and guidance. A form of talk therapy called cognitive behavioral therapy can teach you healthy ways to think about and react to daily life events and activities.  Stress managementtechniques. These include yoga, meditation, and exercise and can be very helpful when they are practiced regularly. A mental health specialist can help determine which treatment is best for you. Some people see improvement with one therapy. However, other people require a combination of therapies.   This information is not intended to replace advice given to you by your health care provider. Make sure you discuss any questions you have with your health care provider.   Document Released: 08/19/2012 Document Revised: 05/15/2014 Document Reviewed: 08/19/2012 Elsevier Interactive Patient Education 2016 [employer] Inc.  

## 2015-06-25 ENCOUNTER — Telehealth: Payer: Self-pay

## 2015-06-25 DIAGNOSIS — I10 Essential (primary) hypertension: Secondary | ICD-10-CM

## 2015-06-25 MED ORDER — TELMISARTAN-HCTZ 40-12.5 MG PO TABS: 1.0000 | ORAL_TABLET | Freq: Every day | ORAL | Status: DC

## 2015-06-25 NOTE — Telephone Encounter (Signed)
done

## 2015-06-25 NOTE — Telephone Encounter (Signed)
Patient wanted to call and informed you the increased in the Telmisartan-HCTZ is working well for the patient. Her BP reading have been in the averages of: 120-115/84-75 and you informed her to call and let you know how it was working for her and you would refill it. Patient states her BP is running well for the patient and she would like the refill of this medication and same dosage. Patient has a follow up schedule with Dr. Carlynn Purl in July 2017.

## 2015-10-27 ENCOUNTER — Other Ambulatory Visit: Payer: Self-pay | Admitting: Family Medicine

## 2015-11-18 ENCOUNTER — Encounter: Payer: Self-pay | Admitting: Family Medicine

## 2015-11-18 ENCOUNTER — Encounter (INDEPENDENT_AMBULATORY_CARE_PROVIDER_SITE_OTHER): Payer: Self-pay

## 2015-11-18 ENCOUNTER — Ambulatory Visit (INDEPENDENT_AMBULATORY_CARE_PROVIDER_SITE_OTHER): Payer: BC Managed Care – PPO | Admitting: Family Medicine

## 2015-11-18 VITALS — BP 128/82 | HR 92 | Temp 98.4°F | Resp 16 | Wt 145.3 lb

## 2015-11-18 DIAGNOSIS — G4700 Insomnia, unspecified: Secondary | ICD-10-CM | POA: Diagnosis not present

## 2015-11-18 DIAGNOSIS — R7303 Prediabetes: Secondary | ICD-10-CM

## 2015-11-18 DIAGNOSIS — I10 Essential (primary) hypertension: Secondary | ICD-10-CM

## 2015-11-18 DIAGNOSIS — F411 Generalized anxiety disorder: Secondary | ICD-10-CM | POA: Diagnosis not present

## 2015-11-18 DIAGNOSIS — E785 Hyperlipidemia, unspecified: Secondary | ICD-10-CM | POA: Diagnosis not present

## 2015-11-18 MED ORDER — TELMISARTAN-HCTZ 40-12.5 MG PO TABS: 1.0000 | ORAL_TABLET | Freq: Every day | ORAL | Status: DC

## 2015-11-18 MED ORDER — DULOXETINE HCL 30 MG PO CPEP: 30.0000 mg | ORAL_CAPSULE | Freq: Every day | ORAL | Status: DC

## 2015-11-18 MED ORDER — ROSUVASTATIN CALCIUM 5 MG PO TABS: 5.0000 mg | ORAL_TABLET | Freq: Every day | ORAL | Status: DC

## 2015-11-18 NOTE — Progress Notes (Signed)
Name: Jodi Fernandez   MRN: [id number]    DOB: 1955-10-25   Date:11/18/2015       Progress Note  Subjective  Chief Complaint  Chief Complaint  Patient presents with  . Medication Refill  . Hypertension    patient has not had any neg sx  . Dyslipidemia  . Insomnia    patient stated that she thinks it has gotten better  . prediabetes  . GAD    patient stated that her anxiety level has increased while riding in a car and with the safety of her grandsons.  . Hoarse    patient stated that sometimes she could be talking and her voice changes. she is not sure if it is allergy-related or a side effect to a medication    HPI  HTN: She is taking Micardis/HCTZ 40/12.5 mg and bp has been under control, she denies side effects of medication.  She denies chest pain, no palpitation. She has gained weight since she retired in 05/2014.   Hyperlipidemia: she has been taking 5mg  Crestor , and CoQ 10 supplements. She denies myalgias. No cramps.   Insomnia: she retired in January 2016, she is taking care of two grandchildren a 343 and 62 year old. She still has problems sleeping, she wakes up at night, but she can fall back asleep. Explained likely from anxiety.  Anxiety: she still struggles with anxiety, she worries about her grandchildren, afraid they will get hurt, also does not like being a passenger in a car - husband told her she needs to chill when he is driving. She also gets nervous when driving a different route.   Pre-diabetes: she has been gaining weight, she denies polyphagia, polydipsia or polyuria.   Hoarse: she has episodes that voice goes in an out. No rhinorrhea or nasal congestion, explained that if constant needs to see ENT   Patient Active Problem List   Diagnosis Date Noted  . GAD (generalized anxiety disorder) 11/11/2014  . Insomnia, persistent 11/11/2014  . Dyspareunia 11/11/2014  . Generalized headache 11/11/2014  . Bursitis, trochanteric 11/11/2014  . History of [place]  [place] spotted fever 04/11/2014  . Dyslipidemia 10/29/2006  . Benign hypertension 10/29/2006  . Allergic rhinitis 10/29/2006    Past Surgical History  Procedure Laterality Date  . Tubal ligation  1987  . Colonoscopy      Family History  Problem Relation Age of Onset  . Heart disease Mother   . Diabetes Mother   . Alcohol abuse Father   . Heart disease Father   . Heart disease Sister   . Hypertension Brother   . Hypertension Brother   . Hypertension Brother   . Hypertension Brother   . Breast cancer Paternal Aunt [id number]    Social History   Social History  . Marital Status: Married    Spouse Name: N/A  . Number of Children: N/A  . Years of Education: N/A   Occupational History  . Not on file.   Social History Main Topics  . Smoking status: Never Smoker   . Smokeless tobacco: Never Used  . Alcohol Use: No  . Drug Use: No  . Sexual Activity:    Partners: Female   Other Topics Concern  . Not on file   Social History Narrative     Current outpatient prescriptions:  .  aspirin EC 81 MG tablet, Take 1 tablet (81 mg total) by mouth daily., Disp: 30 tablet, Rfl: 5 .  Coenzyme Q10 (CO Q 10 PO),  Take 300 mg by mouth daily., Disp: , Rfl:  .  Nutritional Supplements (JUICE PLUS FIBRE PO), Take by mouth daily., Disp: , Rfl:  .  rosuvastatin (CRESTOR) 5 MG tablet, Take 1 tablet (5 mg total) by mouth daily., Disp: 30 tablet, Rfl: 5 .  telmisartan-hydrochlorothiazide (MICARDIS HCT) 40-12.5 MG tablet, Take 1 tablet by mouth daily., Disp: 30 tablet, Rfl: 5 .  ibuprofen (ADVIL,MOTRIN) 200 MG tablet, Take 200 mg by mouth every 6 (six) hours as needed. Reported on 11/18/2015, Disp: , Rfl:  .  Multiple Vitamins-Minerals (AIRBORNE PO), Take by mouth. Reported on 11/18/2015, Disp: , Rfl:   No Known Allergies   ROS  Constitutional: Negative for fever, positive  weight change.  Respiratory: Negative for cough and shortness of breath.   Cardiovascular: Negative for chest pain or  palpitations.  Gastrointestinal: Negative for abdominal pain, no bowel changes.  Musculoskeletal: Negative for gait problem or joint swelling.  Skin: Negative for rash.  Neurological: Negative for dizziness or headache.  No other specific complaints in a complete review of systems (except as listed in HPI above).  Objective  Filed Vitals:   11/18/15 0922  BP: 128/82  Pulse: 92  Temp: 98.4 F (36.9 C)  TempSrc: Oral  Resp: 16  Weight: 145 lb 4.8 oz (65.908 kg)  SpO2: 97%    Body mass index is 26.57 kg/(m^2).  Physical Exam  Constitutional: Patient appears well-developed and well-nourished. Obese  No distress.  HEENT: head atraumatic, normocephalic, pupils equal and reactive to light,neck supple, throat within normal limits Cardiovascular: Normal rate, regular rhythm and normal heart sounds.  No murmur heard. No BLE edema. Pulmonary/Chest: Effort normal and breath sounds normal. No respiratory distress. Abdominal: Soft.  There is no tenderness. Psychiatric: Patient has a normal mood and affect. behavior is normal. Judgment and thought content normal.   PHQ2/9: Depression screen [facility] 2/9 11/18/2015 05/20/2015 02/15/2015 11/12/2014  Decreased Interest 0 0 0 0  Down, Depressed, Hopeless 0 1 1 0  PHQ - 2 Score 0 1 1 0    Fall Risk: Fall Risk  11/18/2015 05/20/2015 02/15/2015 11/12/2014  Falls in the past year? No No No No     Functional Status Survey: Is the patient deaf or have difficulty hearing?: Yes (patient stated that her hearing is not what it used to be) Does the patient have difficulty seeing, even when wearing glasses/contacts?: No Does the patient have difficulty concentrating, remembering, or making decisions?: Yes (patient stated at times she has trouble making a decision) Does the patient have difficulty walking or climbing stairs?: No Does the patient have difficulty dressing or bathing?: No Does the patient have difficulty doing errands alone such as visiting a  doctor's office or shopping?: No    GAD 7 : Generalized Anxiety Score 11/18/2015 05/20/2015  Nervous, Anxious, on Edge 3 3  Control/stop worrying 3 2  Worry too much - different things 3 3  Trouble relaxing 0 1  Restless 0 0  Easily annoyed or irritable 3 3  Afraid - awful might happen 3 3  Total GAD 7 Score 15 15  Anxiety Difficulty Somewhat difficult Somewhat difficult      Assessment & Plan  1. Benign hypertension  - telmisartan-hydrochlorothiazide (MICARDIS HCT) 40-12.5 MG tablet; Take 1 tablet by mouth daily.  Dispense: 30 tablet; Refill: 5 - COMPLETE METABOLIC PANEL WITH GFR  2. Dyslipidemia  - rosuvastatin (CRESTOR) 5 MG tablet; Take 1 tablet (5 mg total) by mouth daily.  Dispense: 30 tablet;  Refill: 5 - Lipid panel  3. Insomnia, persistent  She does not want to take medications at this time  4. Prediabetes  - Hemoglobin A1c  5. GAD (generalized anxiety disorder)  Discussed possible side effects, titrate up slowly  - DULoxetine (CYMBALTA) 30 MG capsule; Take 1-2 capsules (30-60 mg total) by mouth daily.  Dispense: 60 capsule; Refill: 0

## 2016-01-03 ENCOUNTER — Ambulatory Visit: Payer: BC Managed Care – PPO | Admitting: Family Medicine

## 2016-01-11 ENCOUNTER — Other Ambulatory Visit: Payer: Self-pay | Admitting: Family Medicine

## 2016-01-11 LAB — COMPLETE METABOLIC PANEL WITH GFR
ALT: 8 U/L (ref 6–29)
AST: 19 U/L (ref 10–35)
Albumin: 4.7 g/dL (ref 3.6–5.1)
Alkaline Phosphatase: 90 U/L (ref 33–130)
BUN: 12 mg/dL (ref 7–25)
CO2: 27 mmol/L (ref 20–31)
Calcium: 9.4 mg/dL (ref 8.6–10.4)
Chloride: 105 mmol/L (ref 98–110)
Creat: 0.79 mg/dL (ref 0.50–0.99)
GFR, Est African American: >89 mL/min (ref >=60)
GFR, Est Non African American: 80 mL/min (ref >=60)
Glucose, Bld: 109 mg/dL — ABNORMAL HIGH (ref 65–99)
Potassium: 4.4 mmol/L (ref 3.5–5.3)
Sodium: 141 mmol/L (ref 135–146)
Total Bilirubin: 0.5 mg/dL (ref 0.2–1.2)
Total Protein: 7.1 g/dL (ref 6.1–8.1)

## 2016-01-11 LAB — LIPID PANEL
Cholesterol: 220 mg/dL — ABNORMAL HIGH (ref 125–200)
HDL: 51 mg/dL (ref >=46)
LDL Cholesterol: 138 mg/dL — ABNORMAL HIGH (ref <130)
Total CHOL/HDL Ratio: 4.3 Ratio (ref <=5.0)
Triglycerides: 156 mg/dL — ABNORMAL HIGH (ref <150)
VLDL: 31 mg/dL — ABNORMAL HIGH (ref <30)

## 2016-01-12 ENCOUNTER — Telehealth: Payer: Self-pay

## 2016-01-12 LAB — HEMOGLOBIN A1C
Hgb A1c MFr Bld: 5.5 % (ref <5.7)
Mean Plasma Glucose: 111 mg/dL

## 2016-01-12 NOTE — Telephone Encounter (Signed)
Left message for patient to return my call regarding lab results.

## 2016-01-12 NOTE — Telephone Encounter (Signed)
-----   Message from Alba CoryKrichna Sowles, MD sent at 01/12/2016  1:23 PM EDT ----- hgbA1C has improved ( back to normal ) Glucose is slightly elevated for fasting level, normal kidney and liver function test Lipid panel has shown improvement of LDL but still elevated, triglycerides is a little up, and needs to follow a low sugar and low fat diet and take statin therapy daily

## 2016-01-13 ENCOUNTER — Telehealth: Payer: Self-pay | Admitting: Family Medicine

## 2016-01-13 NOTE — Telephone Encounter (Signed)
Left voicemail of lab results on cell phone per patient.

## 2016-05-22 ENCOUNTER — Ambulatory Visit (INDEPENDENT_AMBULATORY_CARE_PROVIDER_SITE_OTHER): Payer: BC Managed Care – PPO | Admitting: Family Medicine

## 2016-05-22 ENCOUNTER — Encounter: Payer: Self-pay | Admitting: Family Medicine

## 2016-05-22 VITALS — BP 140/78 | HR 87 | Temp 98.6°F | Resp 16 | Ht 62.0 in | Wt 146.2 lb

## 2016-05-22 DIAGNOSIS — F411 Generalized anxiety disorder: Secondary | ICD-10-CM

## 2016-05-22 DIAGNOSIS — Z1211 Encounter for screening for malignant neoplasm of colon: Secondary | ICD-10-CM

## 2016-05-22 DIAGNOSIS — I10 Essential (primary) hypertension: Secondary | ICD-10-CM

## 2016-05-22 DIAGNOSIS — E785 Hyperlipidemia, unspecified: Secondary | ICD-10-CM | POA: Diagnosis not present

## 2016-05-22 DIAGNOSIS — R7303 Prediabetes: Secondary | ICD-10-CM | POA: Diagnosis not present

## 2016-05-22 DIAGNOSIS — Z23 Encounter for immunization: Secondary | ICD-10-CM

## 2016-05-22 DIAGNOSIS — G4700 Insomnia, unspecified: Secondary | ICD-10-CM | POA: Diagnosis not present

## 2016-05-22 MED ORDER — ROSUVASTATIN CALCIUM 5 MG PO TABS: 5.0000 mg | ORAL_TABLET | Freq: Every day | ORAL | 5 refills | Status: DC

## 2016-05-22 MED ORDER — TELMISARTAN-HCTZ 40-12.5 MG PO TABS: 1.0000 | ORAL_TABLET | Freq: Every day | ORAL | 5 refills | Status: DC

## 2016-05-22 NOTE — Progress Notes (Signed)
Name: Jodi Fernandez   MRN: [id number]    DOB: 1956-01-13   Date:05/22/2016       Progress Note  Subjective  Chief Complaint  Chief Complaint  Patient presents with  . Hypertension  . Flu Vaccine    HPI  HTN: She is taking Micardis/HCTZ 40/12.5 mg and bp has been under control, she denies side effects of medication.  She denies chest pain, no palpitation. Her weight has been stable since last visit.   Hyperlipidemia: she has been taking 5mg  Crestor , and CoQ 10 supplements. She denies myalgias. No cramps.   Insomnia: she retired in January 2016, she is taking care of two grandchildren a 473 and 525 year old, part time. She still has problems staying asleep. Explained likely from anxiety. She states she is doing better.   Anxiety: she still struggles with anxiety, she worries about her grandchildren, afraid they will get hurt, also does not like being a passenger in a car - husband told her she needs to chill when he is driving. She also gets nervous when driving a different route. She never started Cymbalta. She states she is taking deep breaths, and trying not to worry about everything anymore.    Pre-diabetes: she has been gaining weight, she denies polyphagia, polydipsia or polyuria.    Patient Active Problem List   Diagnosis Date Noted  . GAD (generalized anxiety disorder) 11/11/2014  . Insomnia, persistent 11/11/2014  . Dyspareunia 11/11/2014  . Generalized headache 11/11/2014  . Bursitis, trochanteric 11/11/2014  . History of [place] spotted fever 04/11/2014  . Dyslipidemia 10/29/2006  . Benign hypertension 10/29/2006  . Allergic rhinitis 10/29/2006    Past Surgical History:  Procedure Laterality Date  . COLONOSCOPY    . TUBAL LIGATION  1987    Family History  Problem Relation Age of Onset  . Heart disease Mother   . Diabetes Mother   . Alcohol abuse Father   . Heart disease Father   . Heart disease Sister   . Hypertension Brother   . Hypertension  Brother   . Hypertension Brother   . Hypertension Brother   . Breast cancer Paternal Aunt [id number]    Social History   Social History  . Marital status: Married    Spouse name: N/A  . Number of children: N/A  . Years of education: N/A   Occupational History  . Not on file.   Social History Main Topics  . Smoking status: Never Smoker  . Smokeless tobacco: Never Used  . Alcohol use No  . Drug use: No  . Sexual activity: Yes    Partners: Female   Other Topics Concern  . 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 She watches them after school about twice a week     Current Outpatient Prescriptions:  .  aspirin EC 81 MG tablet, Take 1 tablet (81 mg total) by mouth daily., Disp: 30 tablet, Rfl: 5 .  Coenzyme Q10 (CO Q 10 PO), Take 300 mg by mouth daily., Disp: , Rfl:  .  ibuprofen (ADVIL,MOTRIN) 200 MG tablet, Take 200 mg by mouth every 6 (six) hours as needed. Reported on 11/18/2015, Disp: , Rfl:  .  Multiple Vitamins-Minerals (AIRBORNE PO), Take by mouth. Reported on 11/18/2015, Disp: , Rfl:  .  Nutritional Supplements (JUICE PLUS FIBRE PO), Take by mouth  daily., Disp: , Rfl:  .  rosuvastatin (CRESTOR) 5 MG tablet, Take 1 tablet (5 mg total) by mouth daily., Disp: 30 tablet, Rfl: 5 .  telmisartan-hydrochlorothiazide (MICARDIS HCT) 40-12.5 MG tablet, Take 1 tablet by mouth daily., Disp: 30 tablet, Rfl: 5  No Known Allergies   ROS  Constitutional: Negative for fever or weight change.  Respiratory: Negative for cough and shortness of breath.   Cardiovascular: Negative for chest pain or palpitations.  Gastrointestinal: Negative for abdominal pain, no bowel changes.  Musculoskeletal: Negative for gait problem or joint swelling.  Skin: Negative for rash.  Neurological: Negative for dizziness or headache.  No other specific complaints in a complete review of systems  (except as listed in HPI above).  Objective  Vitals:   05/22/16 0914  BP: 140/78  Pulse: 87  Resp: 16  Temp: 98.6 F (37 C)  SpO2: 97%  Weight: 146 lb 4 oz (66.3 kg)  Height: 5\' 2"  (1.575 m)    Body mass index is 26.75 kg/m.  Physical Exam  Constitutional: Patient appears well-developed and well-nourished. Overweight. No distress.  HEENT: head atraumatic, normocephalic, pupils equal and reactive to light, neck supple, throat within normal limits Cardiovascular: Normal rate, regular rhythm and normal heart sounds.  No murmur heard. No BLE edema. Pulmonary/Chest: Effort normal and breath sounds normal. No respiratory distress. Abdominal: Soft.  There is no tenderness. Psychiatric: Patient has a normal mood and affect. behavior is normal. Judgment and thought content normal.  PHQ2/9: Depression screen [facility] 2/9 05/22/2016 11/18/2015 05/20/2015 02/15/2015 11/12/2014  Decreased Interest 0 0 0 0 0  Down, Depressed, Hopeless 0 0 1 1 0  PHQ - 2 Score 0 0 1 1 0     Fall Risk: Fall Risk  05/22/2016 11/18/2015 05/20/2015 02/15/2015 11/12/2014  Falls in the past year? No No No No No     Functional Status Survey: Is the patient deaf or have difficulty hearing?: No Does the patient have difficulty seeing, even when wearing glasses/contacts?: No Does the patient have difficulty concentrating, remembering, or making decisions?: No Does the patient have difficulty walking or climbing stairs?: No Does the patient have difficulty dressing or bathing?: No Does the patient have difficulty doing errands alone such as visiting a doctor's office or shopping?: No    Assessment & Plan  1. Benign hypertension  - telmisartan-hydrochlorothiazide (MICARDIS HCT) 40-12.5 MG tablet; Take 1 tablet by mouth daily.  Dispense: 30 tablet; Refill: 5  2. Dyslipidemia  - rosuvastatin (CRESTOR) 5 MG tablet; Take 1 tablet (5 mg total) by mouth daily.  Dispense: 30 tablet; Refill: 5  3. Insomnia,  persistent  Doing better, she goes to bed later, but staying asleep at least 7 hours. She is using oils ( in the infusion machine )  4. Prediabetes  On life style modification  5. GAD (generalized anxiety disorder)  She never started Cymbalta  6. Colon cancer screening  - Ambulatory referral to Gastroenterology

## 2016-05-22 NOTE — Addendum Note (Signed)
Addended by: Cynda FamiliaJOHNSON, Shaunna Rosetti L on: 05/22/2016 10:17 AM   Modules accepted: Orders

## 2016-05-22 NOTE — Addendum Note (Signed)
Addended by: Cynda FamiliaJOHNSON, Zacchary Pompei L on: 05/22/2016 10:36 AM   Modules accepted: Orders

## 2016-05-29 ENCOUNTER — Telehealth: Payer: Self-pay | Admitting: General Surgery

## 2016-05-29 NOTE — Telephone Encounter (Signed)
05-29-16 l/m on cell # to return call to make an appointment with Dr Evette CristalSankar

## 2016-06-05 ENCOUNTER — Encounter: Payer: Self-pay | Admitting: *Deleted

## 2016-06-12 ENCOUNTER — Telehealth: Payer: Self-pay | Admitting: Family Medicine

## 2016-06-12 ENCOUNTER — Other Ambulatory Visit: Payer: Self-pay | Admitting: Family Medicine

## 2016-06-12 DIAGNOSIS — Z1231 Encounter for screening mammogram for malignant neoplasm of breast: Secondary | ICD-10-CM

## 2016-06-12 NOTE — Telephone Encounter (Signed)
Per the patient's request, referral was sent to Dr. Lonell Faceaniel Wild with [facility].   P: [phone] F: [phone]: [phone].  They will contact this patient with appt date and time.

## 2016-06-12 NOTE — Telephone Encounter (Signed)
Pt was referred to Dr Evette CristalSankar for Colonoscopy, however patient would not like to go there. She would like the appointment with Dr Helyn Appaniel M. Wild at [facility]. [address] [place] [id number] [phone]) [phone] Pt states that he is in her network. If you have any questions please give pt a call [phone] if she does not answer it is okay to leave detailed message.

## 2016-07-06 ENCOUNTER — Ambulatory Visit
Admission: RE | Admit: 2016-07-06 | Discharge: 2016-07-06 | Disposition: A | Discharge status: Home / Self Care | Payer: BC Managed Care – PPO | Source: Ambulatory Visit | Attending: Family Medicine | Admitting: Family Medicine

## 2016-07-06 DIAGNOSIS — Z1231 Encounter for screening mammogram for malignant neoplasm of breast: Secondary | ICD-10-CM | POA: Diagnosis present

## 2016-07-18 ENCOUNTER — Telehealth: Payer: Self-pay

## 2016-07-18 ENCOUNTER — Other Ambulatory Visit: Payer: Self-pay | Admitting: Family Medicine

## 2016-07-18 DIAGNOSIS — R002 Palpitations: Secondary | ICD-10-CM

## 2016-07-18 NOTE — Telephone Encounter (Signed)
Patient sister has had heart conditions and with it running in the family very concerned about A-Fib. Should she come in due to concerns with BP and having a colonoscopy coming up? Please advise.

## 2016-07-18 NOTE — Telephone Encounter (Signed)
She can go to have colonoscopy She needs to return for follow up if she would like to see cardiologist

## 2016-07-18 NOTE — Telephone Encounter (Signed)
Patient called and states last Saturday 07/15/16 her BP was up to 145/123 with her HR 136 with also experiencing sweating episodes, dizziness and her heart racing. Patient states the one pill of Micardis was making her lightheaded so due to her activity level and some days will only take 1/2 a pill if she is very active that day. But on inactive days she will take a whole pill. This episode she took the other half of the pill and her BP went down to 123/82 HR 82. Also patient stopped Aspirin in February due to colonoscopy coming up. Patient is wondering if her essential oils are affecting her BP and spoke with Dr. Carlynn PurlSowles about this due to the rosemary and doterra vitamins in the oils. So patient has stopped using the oil and wondered if she could continue with her colonoscopy. Patient discuss symptoms with daughter and sister since they are RNs, and they are concern heart conditions run in their family. They are worried about possible A-Fib. Please advise. Since stopping her oils and doterra her BP has been fine with BP running 129/88 HR 66. 06/21/16 she had not taken her medication yet this day and that morning her head was feeling funny and her BP 148/101- HR 66. Cell number [phone]

## 2016-07-25 DIAGNOSIS — I479 Paroxysmal tachycardia, unspecified: Secondary | ICD-10-CM | POA: Insufficient documentation

## 2016-07-28 ENCOUNTER — Encounter: Payer: Self-pay | Admitting: Family Medicine

## 2016-07-28 ENCOUNTER — Ambulatory Visit (INDEPENDENT_AMBULATORY_CARE_PROVIDER_SITE_OTHER): Payer: BC Managed Care – PPO | Admitting: Family Medicine

## 2016-07-28 VITALS — BP 126/84 | HR 90 | Temp 98.0°F | Resp 16 | Wt 147.7 lb

## 2016-07-28 DIAGNOSIS — J029 Acute pharyngitis, unspecified: Secondary | ICD-10-CM | POA: Diagnosis not present

## 2016-07-28 DIAGNOSIS — J069 Acute upper respiratory infection, unspecified: Secondary | ICD-10-CM

## 2016-07-28 DIAGNOSIS — B9789 Other viral agents as the cause of diseases classified elsewhere: Secondary | ICD-10-CM

## 2016-07-28 LAB — POCT RAPID STREP A (OFFICE): Rapid Strep A Screen: NEGATIVE

## 2016-07-28 NOTE — Patient Instructions (Addendum)
You are contagious so be cautious  Pharyngitis Pharyngitis is redness, pain, and swelling (inflammation) of your pharynx. What are the causes? Pharyngitis is usually caused by infection. Most of the time, these infections are from viruses (viral) and are part of a cold. However, sometimes pharyngitis is caused by bacteria (bacterial). Pharyngitis can also be caused by allergies. Viral pharyngitis may be spread from person to person by coughing, sneezing, and personal items or utensils (cups, forks, spoons, toothbrushes). Bacterial pharyngitis may be spread from person to person by more intimate contact, such as kissing. What are the signs or symptoms? Symptoms of pharyngitis include:  Sore throat.  Tiredness (fatigue).  Low-grade fever.  Headache.  Joint pain and muscle aches.  Skin rashes.  Swollen lymph nodes.  Plaque-like film on throat or tonsils (often seen with bacterial pharyngitis). How is this diagnosed? Your health care provider will ask you questions about your illness and your symptoms. Your medical history, along with a physical exam, is often all that is needed to diagnose pharyngitis. Sometimes, a rapid strep test is done. Other lab tests may also be done, depending on the suspected cause. How is this treated? Viral pharyngitis will usually get better in 3-4 days without the use of medicine. Bacterial pharyngitis is treated with medicines that kill germs (antibiotics). Follow these instructions at home:  Drink enough water and fluids to keep your urine clear or pale yellow.  Only take over-the-counter or prescription medicines as directed by your health care provider:  If you are prescribed antibiotics, make sure you finish them even if you start to feel better.  Do not take aspirin.  Get lots of rest.  Gargle with 8 oz of salt water ( tsp of salt per 1 qt of water) as often as every 1-2 hours to soothe your throat.  Throat lozenges (if you are not at risk  for choking) or sprays may be used to soothe your throat. Contact a health care provider if:  You have large, tender lumps in your neck.  You have a rash.  You cough up green, yellow-brown, or bloody spit. Get help right away if:  Your neck becomes stiff.  You drool or are unable to swallow liquids.  You vomit or are unable to keep medicines or liquids down.  You have severe pain that does not go away with the use of recommended medicines.  You have trouble breathing (not caused by a stuffy nose). This information is not intended to replace advice given to you by your health care provider. Make sure you discuss any questions you have with your health care provider. Document Released: 04/24/2005 Document Revised: 09/30/2015 Document Reviewed: 12/30/2012 Elsevier Interactive Patient Education  2017 [employer]

## 2016-07-28 NOTE — Progress Notes (Signed)
BP 126/84   Pulse 90   Temp 98 F (36.7 C) (Oral)   Resp 16   Wt 147 lb 11.2 oz (67 kg)   SpO2 92%   BMI 27.01 kg/m    Subjective:    Patient ID: Jodi Fernandez, female    DOB: 1956/04/08, 61 y.o.   MRN: [id number]  HPI: Jodi Fernandez is a 61 y.o. female  Chief Complaint  Patient presents with  . URI    mlucos green/yeallow cough and sore throat   She is here for an acute visit She started with a sore throat Was hoarse Throat got worse Cough started Nose was running No fever No body aches, nothing out of the ordinary [place], no other travel No visits to nursing home or hospital; church has a lot of elderly people with flu Has not had flu shot yet She has tried airborne and ibuprofen, chloraseptic, done some essential oils Starts to feel better in the day, worse in the evenings Coughing her head off at night  Dep[facility] 07/28/2016 05/22/2016 11/18/2015 05/20/2015 02/15/2015  Decreased Interest 0 0 0 0 0  Down, Depressed, Hopeless 1 0 0 1 1  PHQ - 2 Score 1 0 0 1 1    GAD 7 : Generalized Anxiety Score 11/18/2015 05/20/2015  Nervous, Anxious, on Edge 3 3  Control/stop worrying 3 2  Worry too much - different things 3 3  Trouble relaxing 0 1  Restless 0 0  Easily annoyed or irritable 3 3  Afraid - awful might happen 3 3  Total GAD 7 Score 15 15  Anxiety Difficulty Somewhat difficult Somewhat difficult    Relevant past medical, surgical, family and social history reviewed Past Medical History:  Diagnosis Date  . Abnormal glucose   . Allergy   . Generalized headache   . Hemorrhoids   . Hyperlipidemia   . Hypertension   . Insomnia   . Rocky Mountain spotted fever    Past Surgical History:  Procedure Laterality Date  . COLONOSCOPY    . TUBAL LIGATION  1987   Family History  Problem Relation Age of Onset  . Heart disease Mother   . Diabetes Mother   . Alcohol abuse Father   . Heart disease Father   . Heart disease  Sister   . Hypertension Brother   . Hypertension Brother   . Hypertension Brother   . Hypertension Brother   . Breast cancer Paternal Aunt [id number]  . Breast cancer Cousin    Social History  Substance Use Topics  . Smoking status: Never Smoker  . Smokeless tobacco: Never Used  . Alcohol use No    Interim medical history since last visit reviewed. Allergies and medications reviewed  Review of Systems Per HPI unless specifically indicated above     Objective:    BP 126/84   Pulse 90   Temp 98 F (36.7 C) (Oral)   Resp 16   Wt 147 lb 11.2 oz (67 kg)   SpO2 92%   BMI 27.01 kg/m   Wt Readings from Last 3 Encounters:  07/28/16 147 lb 11.2 oz (67 kg)  05/22/16 146 lb 4 oz (66.3 kg)  11/18/15 145 lb 4.8 oz (65.9 kg)    Physical Exam  Constitutional: She appears well-developed and well-nourished.  HENT:  Right Ear: Tympanic membrane and ear canal normal.  Left Ear: Tympanic membrane and ear canal normal.  Nose: Rhinorrhea present.  Mouth/Throat: Mucous membranes are normal. Posterior oropharyngeal erythema (mild injection) present. No oropharyngeal exudate or posterior oropharyngeal edema.  Eyes: EOM are normal. No scleral icterus.  Cardiovascular: Normal rate and regular rhythm.   Pulmonary/Chest: Effort normal and breath sounds normal.  Lymphadenopathy:    She has no cervical adenopathy.  Skin: She is not diaphoretic. No pallor.  Psychiatric: She has a normal mood and affect. Her behavior is normal.      Assessment & Plan:   Problem List Items Addressed This Visit    None    Visit Diagnoses    Sore throat    -  Primary   rapid strep negative; culture pending; suspect viral etiology; no antibiotics indicated at this time; see AVS   Relevant Orders   POCT rapid strep A (Completed)   Culture, Group A Strep (Completed)   Viral upper respiratory infection       explained this is likely viral; rest, hydration; she is contagious; reasons to call reviewed; conservative  management       Follow up plan: No Follow-up on file.  An after-visit summary was printed and given to the patient at check-out.  Please see the patient instructions which may contain other information and recommendations beyond what is mentioned above in the assessment and plan.  Meds ordered this encounter  Medications  . acetaminophen (TYLENOL) 650 MG CR tablet    Sig: Take 650 mg by mouth as needed for pain.  . Multiple Vitamins-Minerals (AIRBORNE PO)    Sig: Take by mouth.    Orders Placed This Encounter  Procedures  . Culture, Group A Strep  . POCT rapid strep A

## 2016-07-30 LAB — CULTURE, GROUP A STREP

## 2016-08-11 DIAGNOSIS — I472 Ventricular tachycardia, unspecified: Secondary | ICD-10-CM

## 2016-08-11 HISTORY — DX: Ventricular tachycardia, unspecified: I47.20

## 2016-08-11 HISTORY — DX: Ventricular tachycardia: I47.2

## 2016-08-11 LAB — HM COLONOSCOPY

## 2016-08-14 ENCOUNTER — Encounter: Payer: Self-pay | Admitting: Family Medicine

## 2016-08-16 ENCOUNTER — Encounter: Payer: Self-pay | Admitting: Family Medicine

## 2016-08-16 ENCOUNTER — Ambulatory Visit (INDEPENDENT_AMBULATORY_CARE_PROVIDER_SITE_OTHER): Payer: BC Managed Care – PPO | Admitting: Family Medicine

## 2016-08-16 VITALS — BP 132/84 | HR 100 | Temp 98.3°F | Resp 16 | Ht 62.0 in | Wt 145.4 lb

## 2016-08-16 DIAGNOSIS — N3091 Cystitis, unspecified with hematuria: Secondary | ICD-10-CM

## 2016-08-16 DIAGNOSIS — R310 Gross hematuria: Secondary | ICD-10-CM | POA: Diagnosis not present

## 2016-08-16 LAB — POCT URINALYSIS DIPSTICK
Bilirubin, UA: NEGATIVE
Glucose, UA: NEGATIVE
Nitrite, UA: NEGATIVE
Spec Grav, UA: 1.015 (ref 1.010–1.025)
Urobilinogen, UA: NEGATIVE E.U./dL — AB
pH, UA: 5 (ref 5.0–8.0)

## 2016-08-16 MED ORDER — CIPROFLOXACIN HCL 250 MG PO TABS: 250.0000 mg | ORAL_TABLET | Freq: Two times a day (BID) | ORAL | Status: DC

## 2016-08-16 NOTE — Patient Instructions (Addendum)

## 2016-08-16 NOTE — Progress Notes (Addendum)
Name: Jodi Fernandez   MRN: [id number]    DOB: 12/05/1955   Date:08/16/2016       Progress Note  Subjective  Chief Complaint  Chief Complaint  Patient presents with  . Vaginal Bleeding    started this morning    HPI  Pt presents with new onset bleeding with urination that began today. She had a colonoscopy performed 08/11/2016 with hemorrhoids noted; had small amnt of rectal bleeding that day only. This morning around 0830 she had a bowel movement and urinated and noticed bright red blood and nickel sized clots in the toilet.  She returned to urinate later this morning and noticed more dark clots and bright red blood.  Pt has been post-menopausal since her late 85's.  She denies abnormal vaginal discharge, trauma to area, recent intercourse, or high risk sexual behaviors; no NVD or constipation  Patient Active Problem List   Diagnosis Date Noted  . GAD (generalized anxiety disorder) 11/11/2014  . Insomnia, persistent 11/11/2014  . Dyspareunia 11/11/2014  . Generalized headache 11/11/2014  . Bursitis, trochanteric 11/11/2014  . History of Rocky Mountain spotted fever 04/11/2014  . Dyslipidemia 10/29/2006  . Benign hypertension 10/29/2006  . Allergic rhinitis 10/29/2006    Social History  Substance Use Topics  . Smoking status: Never Smoker  . Smokeless tobacco: Never Used  . Alcohol use No    Current Outpatient Prescriptions:  .  acetaminophen (TYLENOL) 650 MG CR tablet, Take 650 mg by mouth as needed for pain., Disp: , Rfl:  .  aspirin EC 81 MG tablet, Take 1 tablet (81 mg total) by mouth daily., Disp: 30 tablet, Rfl: 5 .  Coenzyme Q10 (CO Q 10 PO), Take 300 mg by mouth daily., Disp: , Rfl:  .  ibuprofen (ADVIL,MOTRIN) 200 MG tablet, Take 200 mg by mouth every 6 (six) hours as needed. Reported on 11/18/2015, Disp: , Rfl:  .  Multiple Vitamins-Minerals (AIRBORNE PO), Take by mouth., Disp: , Rfl:  .  rosuvastatin (CRESTOR) 5 MG tablet, Take 1 tablet (5 mg total) by mouth daily.,  Disp: 30 tablet, Rfl: 5 .  telmisartan-hydrochlorothiazide (MICARDIS HCT) 40-12.5 MG tablet, Take 1 tablet by mouth daily., Disp: 30 tablet, Rfl: 5  No Known Allergies  ROS  Constitutional: Negative for fever or weight change.  Respiratory: Negative for cough and shortness of breath.   Cardiovascular: Negative for chest pain. Reports wearing Holter monitor recently for palpitations and is followed by Dr. Vita Erm with [facility] for this; follow up is scheduled for 08/28/2016. Gastrointestinal: Negative for abdominal pain, no bowel changes.  Genitourinary: Positive for hematuria. Negative for use of estrogen products, vaginal discharge, high risk sexual behavior or recent intercourse. Musculoskeletal: Negative for gait problem or joint swelling. Positive for bilateral hip and low back pain relieved with Ibuprofen PRN. Skin: Negative for rash.  Neurological: Negative for dizziness or headache.  No other specific complaints in a complete review of systems (except as listed in HPI above).  Objective  Nursing notes and vital signs reviewed.  Vitals:   08/16/16 1428  BP: 132/84  Pulse: 100  Resp: 16  Temp: 98.3 F (36.8 C)  TempSrc: Oral  SpO2: 94%  Weight: 145 lb 6.4 oz (66 kg)  Height:  (1.575 m)    Body mass index is 26.59 kg/m.  Physical Exam  Constitutional: Patient appears well-developed and well-nourished. No distress.  HEENT: head atraumatic, normocephalic, neck supple, throat within normal limits Cardiovascular: Normal rate, regular rhythm and normal  heart sounds.  No murmur heard. No BLE edema. Pulmonary/Chest: Effort normal and breath sounds clear. No respiratory distress. Abdominal: Soft.  There is no tenderness, bowel sounds present x4 quad. Psychiatric: Patient has a normal mood and affect. behavior is normal. Judgment and thought content normal.  Female Genitourinary:  External Genitalia Vulva:Characteristics- Normal. Introitus:- No  Discharge. Labia Majora:Characteristics- Bilateral- Normal. Labia Minora:Characteristics- Bilateral- Normal. Urethra:Discharge- None.  Speculum & Bimanual Vagina:Vaginal Lesions- None. Vaginal Mucosa- Normal. Cervix:Discharge- None. Bleeding- None Uterus:Characteristics- Normal. Adnexa:Characteristics- Bilateral- Normal. Bladder- Normal.  Rectovaginal Exam No frank blood noted on inspection  Recent Results (from the past 2160 hour(s))  POCT rapid strep A     Status: Normal   Collection Time: 07/28/16 11:56 AM  Result Value Ref Range   Rapid Strep A Screen Negative Negative  Culture, Group A Strep     Status: None   Collection Time: 07/28/16 12:05 PM  Result Value Ref Range   Organism ID, Bacteria NO GROUP A STREP (S. PYOGENES) ISOLATED   HM COLONOSCOPY     Status: None   Collection Time: 08/11/16 12:00 AM  Result Value Ref Range   HM Colonoscopy See Report (in chart) See Report (in chart), Patient Reported    Comment: Normal other small-mouthed diverticula sigmoidcolon, Dr.Wild  POCT urinalysis dipstick     Status: Abnormal   Collection Time: 08/16/16  3:12 PM  Result Value Ref Range   Color, UA brown    Clarity, UA cloudy    Glucose, UA negative    Bilirubin, UA negative    Ketones, UA nebgative    Spec Grav, UA 1.015 1.010 - 1.025   Blood, UA moderate    pH, UA 5.0 5.0 - 8.0   Protein, UA trace    Urobilinogen, UA negative (A) 0.2 or 1.0 E.U./dL   Nitrite, UA negative    Leukocytes, UA Moderate (2+) (A) Negative    Assessment & Plan  1. Hemorrhagic cystitis - Ciprofloxacin  BID x3 days - Red flags discussed with patient including no improvement with antibiotic, new/worsening symptoms, abdominal/back/flank pain, vomiting, diarrhea, blood in stool, or vaginal bleeding.  - Follow up - PRN; patient also has has well woman examination scheduled for 08/21/2016  2. Gross hematuria - POCT urinalysis dipstick - CULTURE, URINE  COMPREHENSIVE  Reviewed note done by Maurice Small NP, and agree with assessment and plan.  We will recheck urine dip on her next follow up We will refer her Urologist if urine culture negative

## 2016-08-17 MED ORDER — CIPROFLOXACIN HCL 250 MG PO TABS: 250.0000 mg | ORAL_TABLET | Freq: Two times a day (BID) | ORAL | 0 refills | Status: DC

## 2016-08-18 ENCOUNTER — Encounter: Payer: Self-pay | Admitting: Family Medicine

## 2016-08-18 LAB — CULTURE, URINE COMPREHENSIVE

## 2016-08-18 NOTE — Progress Notes (Signed)
No need to call again. Thank you for following up with this patient!

## 2016-08-21 ENCOUNTER — Ambulatory Visit (INDEPENDENT_AMBULATORY_CARE_PROVIDER_SITE_OTHER): Payer: BC Managed Care – PPO | Admitting: Family Medicine

## 2016-08-21 ENCOUNTER — Encounter: Payer: Self-pay | Admitting: Family Medicine

## 2016-08-21 VITALS — BP 138/86 | Temp 98.3°F | Resp 16 | Ht 61.5 in | Wt 146.5 lb

## 2016-08-21 DIAGNOSIS — Z1231 Encounter for screening mammogram for malignant neoplasm of breast: Secondary | ICD-10-CM | POA: Diagnosis not present

## 2016-08-21 DIAGNOSIS — N3091 Cystitis, unspecified with hematuria: Secondary | ICD-10-CM | POA: Diagnosis not present

## 2016-08-21 DIAGNOSIS — Z01419 Encounter for gynecological examination (general) (routine) without abnormal findings: Secondary | ICD-10-CM

## 2016-08-21 DIAGNOSIS — Z124 Encounter for screening for malignant neoplasm of cervix: Secondary | ICD-10-CM | POA: Diagnosis not present

## 2016-08-21 DIAGNOSIS — Z1239 Encounter for other screening for malignant neoplasm of breast: Secondary | ICD-10-CM

## 2016-08-21 NOTE — Patient Instructions (Signed)
Preventive Care 40-64 Years, Female Preventive care refers to lifestyle choices and visits with your health care provider that can promote health and wellness. What does preventive care include?  A yearly physical exam. This is also called an annual well check.  Dental exams once or twice a year.  Routine eye exams. Ask your health care provider how often you should have your eyes checked.  Personal lifestyle choices, including:  Daily care of your teeth and gums.  Regular physical activity.  Eating a healthy diet.  Avoiding tobacco and drug use.  Limiting alcohol use.  Practicing safe sex.  Taking low-dose aspirin daily starting at age 63.  Taking vitamin and mineral supplements as recommended by your health care provider. What happens during an annual well check? The services and screenings done by your health care provider during your annual well check will depend on your age, overall health, lifestyle risk factors, and family history of disease. Counseling  Your health care provider may ask you questions about your:  Alcohol use.  Tobacco use.  Drug use.  Emotional well-being.  Home and relationship well-being.  Sexual activity.  Eating habits.  Work and work environment.  Method of birth control.  Menstrual cycle.  Pregnancy history. Screening  You may have the following tests or measurements:  Height, weight, and BMI.  Blood pressure.  Lipid and cholesterol levels. These may be checked every 5 years, or more frequently if you are over 63 years old.  Skin check.  Lung cancer screening. You may have this screening every year starting at age 63 if you have a 30-pack-year history of smoking and currently smoke or have quit within the past 15 years.  Fecal occult blood test (FOBT) of the stool. You may have this test every year starting at age 63.  Flexible sigmoidoscopy or colonoscopy. You may have a sigmoidoscopy every 5 years or a colonoscopy  every 10 years starting at age 63.  Hepatitis C blood test.  Hepatitis B blood test.  Sexually transmitted disease (STD) testing.  Diabetes screening. This is done by checking your blood sugar (glucose) after you have not eaten for a while (fasting). You may have this done every 1-3 years.  Mammogram. This may be done every 1-2 years. Talk to your health care provider about when you should start having regular mammograms. This may depend on whether you have a family history of breast cancer.  BRCA-related cancer screening. This may be done if you have a family history of breast, ovarian, tubal, or peritoneal cancers.  Pelvic exam and Pap test. This may be done every 3 years starting at age 63. Starting at age 63, this may be done every 5 years if you have a Pap test in combination with an HPV test.  Bone density scan. This is done to screen for osteoporosis. You may have this scan if you are at high risk for osteoporosis. Discuss your test results, treatment options, and if necessary, the need for more tests with your health care provider. Vaccines  Your health care provider may recommend certain vaccines, such as:  Influenza vaccine. This is recommended every year.  Tetanus, diphtheria, and acellular pertussis (Tdap, Td) vaccine. You may need a Td booster every 10 years.  Varicella vaccine. You may need this if you have not been vaccinated.  Zoster vaccine. You may need this after age 63.  Measles, mumps, and rubella (MMR) vaccine. You may need at least one dose of MMR if you were born   in 1957 or later. You may also need a second dose.  Pneumococcal 13-valent conjugate (PCV13) vaccine. You may need this if you have certain conditions and were not previously vaccinated.  Pneumococcal polysaccharide (PPSV23) vaccine. You may need one or two doses if you smoke cigarettes or if you have certain conditions.  Meningococcal vaccine. You may need this if you have certain  conditions.  Hepatitis A vaccine. You may need this if you have certain conditions or if you travel or work in places where you may be exposed to hepatitis A.  Hepatitis B vaccine. You may need this if you have certain conditions or if you travel or work in places where you may be exposed to hepatitis B.  Haemophilus influenzae type b (Hib) vaccine. You may need this if you have certain conditions. Talk to your health care provider about which screenings and vaccines you need and how often you need them. This information is not intended to replace advice given to you by your health care provider. Make sure you discuss any questions you have with your health care provider. Document Released: 05/21/2015 Document Revised: 01/12/2016 Document Reviewed: 02/23/2015 Elsevier Interactive Patient Education  2017 [employer].

## 2016-08-21 NOTE — Progress Notes (Signed)
Name: Jodi Fernandez   MRN: [id number]    DOB: November 23, 1955   Date:08/21/2016       Progress Note  Subjective  Chief Complaint  Chief Complaint  Patient presents with  . Annual Exam    HPI  Well Woman: she recently had hemorrhagic cystitis, but no longer having symptoms. No change in bowel movements. She denies vaginal discharge, not currently sexually active - husband has health issues, in the past she had discomfort with intercourse. Mammogram is up to date. Colonoscopy is also up to date.    Patient Active Problem List   Diagnosis Date Noted  . Tachycardia, paroxysmal (HCC) 07/25/2016  . GAD (generalized anxiety disorder) 11/11/2014  . Insomnia, persistent 11/11/2014  . Dyspareunia 11/11/2014  . Generalized headache 11/11/2014  . Bursitis, trochanteric 11/11/2014  . History of Rocky [place] spotted fever 04/11/2014  . Dyslipidemia 10/29/2006  . Benign hypertension 10/29/2006  . Allergic rhinitis 10/29/2006    Past Surgical History:  Procedure Laterality Date  . COLONOSCOPY    . TUBAL LIGATION  1987    Family History  Problem Relation Age of Onset  . Heart disease Mother   . Diabetes Mother   . Alcohol abuse Father   . Heart disease Father   . Heart disease Sister   . Hypertension Brother   . Hypertension Brother   . Hypertension Brother   . Hypertension Brother   . Breast cancer Paternal Aunt 12  . Breast cancer Cousin     Social History   Social History  . Marital status: Married    Spouse name: N/A  . Number of children: N/A  . Years of education: N/A   Occupational History  . Not on file.   Social History Main Topics  . Smoking status: Never Smoker  . Smokeless tobacco: Never Used  . Alcohol use No  . Drug use: No  . Sexual activity: Yes    Partners: Female   Other Topics Concern  . 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  She watches them after school about twice a week   Husband is a Chartered loss adjuster, works on cars, but does not seem to care about their house     Current Outpatient Prescriptions:  .  acetaminophen (TYLENOL) 650 MG CR tablet, Take 650 mg by mouth as needed for pain., Disp: , Rfl:  .  aspirin EC 81 MG tablet, Take 1 tablet (81 mg total) by mouth daily., Disp: 30 tablet, Rfl: 5 .  ciprofloxacin (CIPRO) 250 MG tablet, Take 1 tablet (250 mg total) by mouth 2 (two) times daily., Disp: 6 tablet, Rfl: 0 .  Coenzyme Q10 (CO Q 10 PO), Take 300 mg by mouth daily., Disp: , Rfl:  .  ibuprofen (ADVIL,MOTRIN) 200 MG tablet, Take 200 mg by mouth every 6 (six) hours as needed. Reported on 11/18/2015, Disp: , Rfl:  .  Multiple Vitamins-Minerals (AIRBORNE PO), Take by mouth., Disp: , Rfl:  .  rosuvastatin (CRESTOR) 5 MG tablet, Take 1 tablet (5 mg total) by mouth daily., Disp: 30 tablet, Rfl: 5 .  telmisartan-hydrochlorothiazide (MICARDIS HCT) 40-12.5 MG tablet, Take 1 tablet by mouth daily., Disp: 30 tablet, Rfl: 5  No Known Allergies   ROS  Constitutional: Negative for fever or weight change.  Respiratory: Negative for cough and shortness of breath.   Cardiovascular: Negative  for chest pain or palpitations.  Gastrointestinal: Negative for abdominal pain, no bowel changes.  Musculoskeletal: Negative for gait problem or joint swelling.  Skin: Negative for rash.  Neurological: Negative for dizziness or headache.  No other specific complaints in a complete review of systems (except as listed in HPI above).  Objective  Vitals:   08/21/16 0843  BP: 138/86  Resp: 16  Temp: 98.3 F (36.8 C)  SpO2: 98%  Weight: 146 lb 8 oz (66.5 kg)  Height: 5' 1.5" (1.562 m)    Body mass index is 27.23 kg/m.  Physical Exam  Constitutional: Patient appears well-developed and overweight. No distress.  HENT: Head: Normocephalic and atraumatic. Ears: B TMs ok, no erythema or effusion; Nose: Nose normal. Mouth/Throat:  Oropharynx is clear and moist. No oropharyngeal exudate.  Eyes: Conjunctivae and EOM are normal. Pupils are equal, round, and reactive to light. No scleral icterus.  Neck: Normal range of motion. Neck supple. No JVD present. No thyromegaly present.  Cardiovascular: Normal rate, regular rhythm and normal heart sounds.  No murmur heard. No BLE edema. Pulmonary/Chest: Effort normal and breath sounds normal. No respiratory distress. Abdominal: Soft. Bowel sounds are normal, no distension. There is no tenderness. no masses Breast: no lumps or masses, no nipple discharge or rashes FEMALE GENITALIA:  External genitalia normal External urethra normal Not done RECTAL: not done Musculoskeletal: Normal range of motion, no joint effusions. No gross deformities Neurological: he is alert and oriented to person, place, and time. No cranial nerve deficit. Coordination, balance, strength, speech and gait are normal.  Skin: Skin is warm and dry. No rash noted. No erythema.  Psychiatric: Patient has a normal mood and affect. behavior is normal. Judgment and thought content normal.  Recent Results (from the past 2160 hour(s))  POCT rapid strep A     Status: Normal   Collection Time: 07/28/16 11:56 AM  Result Value Ref Range   Rapid Strep A Screen Negative Negative  Culture, Group A Strep     Status: None   Collection Time: 07/28/16 12:05 PM  Result Value Ref Range   Organism ID, Bacteria NO GROUP A STREP (S. PYOGENES) ISOLATED   HM COLONOSCOPY     Status: None   Collection Time: 08/11/16 12:00 AM  Result Value Ref Range   HM Colonoscopy See Report (in chart) See Report (in chart), Patient Reported    Comment: Normal other small-mouthed diverticula sigmoidcolon, Dr.Wild  POCT urinalysis dipstick     Status: Abnormal   Collection Time: 08/16/16  3:12 PM  Result Value Ref Range   Color, UA brown    Clarity, UA cloudy    Glucose, UA negative    Bilirubin, UA negative    Ketones, UA nebgative    Spec  Grav, UA 1.015 1.010 - 1.025   Blood, UA moderate    pH, UA 5.0 5.0 - 8.0   Protein, UA trace    Urobilinogen, UA negative (A) 0.2 or 1.0 E.U./dL   Nitrite, UA negative    Leukocytes, UA Moderate (2+) (A) Negative  CULTURE, URINE COMPREHENSIVE     Status: None   Collection Time: 08/16/16  3:21 PM  Result Value Ref Range   Culture ESCHERICHIA COLI    Colony Count Greater than 100,000 CFU/mL    Organism ID, Bacteria ESCHERICHIA COLI       Susceptibility   Escherichia coli -  (no method available)    AMPICILLIN 8 Sensitive     AMOX/CLAVULANIC <=2 Sensitive  AMPICILLIN/SULBACTAM <=2 Sensitive     PIP/TAZO <=4 Sensitive     IMIPENEM <=0.25 Sensitive     CEFAZOLIN <=4 Not Reportable     CEFTRIAXONE <=1 Sensitive     CEFTAZIDIME <=1 Sensitive     CEFEPIME <=1 Sensitive     GENTAMICIN <=1 Sensitive     TOBRAMYCIN <=1 Sensitive     CIPROFLOXACIN <=0.25 Sensitive     LEVOFLOXACIN <=0.12 Sensitive     NITROFURANTOIN <=16 Sensitive     TRIMETH/SULFA* <=20 Sensitive      * NR=NOT REPORTABLE,SEE COMMENTORAL therapy:A cefazolin MIC of <32 predicts susceptibility to the oral agents cefaclor,cefdinir,cefpodoxime,cefprozil,cefuroxime,cephalexin,and loracarbef when used for therapy of uncomplicated UTIs due to E.coli,K.pneumomiae,and P.mirabilis. PARENTERAL therapy: A cefazolinMIC of >8 indicates resistance to parenteralcefazolin. An alternate test method must beperformed to confirm susceptibility to parenteralcefazolin.     PHQ2/9: Depression screen [facility] 2/9 08/21/2016 07/28/2016 05/22/2016 11/18/2015 05/20/2015  Decreased Interest 0 0 0 0 0  Down, Depressed, Hopeless 1 1 0 0 1  PHQ - 2 Score 1 1 0 0 1   She is getting under more stress since she retired, husband works from home and she is getting frustrated with him  Fall Risk: Fall Risk  08/21/2016 07/28/2016 05/22/2016 11/18/2015 05/20/2015  Falls in the past year? No No No No No     Functional Status Survey: Is the patient deaf or have  difficulty hearing?: No Does the patient have difficulty seeing, even when wearing glasses/contacts?: No Does the patient have difficulty concentrating, remembering, or making decisions?: No Does the patient have difficulty walking or climbing stairs?: No Does the patient have difficulty dressing or bathing?: No Does the patient have difficulty doing errands alone such as visiting a doctor's office or shopping?: No    Assessment & Plan  1. Well woman exam  Discussed importance of 150 minutes of physical activity weekly, eat two servings of fish weekly, eat one serving of tree nuts ( cashews, pistachios, pecans, almonds.Marland Kitchen) every other day, eat 6 servings of fruit/vegetables daily and drink plenty of water and avoid sweet beverages.  Discussed topical estrogen, but she would like to hold off for now.  2. Hemorrhagic cystitis  Treated 03/13, no longer bleeding, we will recheck labs  - Urine culture - Urinalysis, Routine w reflex microscopic  3. Breast cancer screening  Up to date  4. Cervical cancer screening  Up to date

## 2016-08-22 LAB — URINALYSIS, ROUTINE W REFLEX MICROSCOPIC
Bilirubin Urine: NEGATIVE
Glucose, UA: NEGATIVE
Hgb urine dipstick: NEGATIVE
Ketones, ur: NEGATIVE
Leukocytes, UA: NEGATIVE
Nitrite: NEGATIVE
Protein, ur: NEGATIVE
Specific Gravity, Urine: 1.008 (ref 1.001–1.035)
pH: 5.5 (ref 5.0–8.0)

## 2016-11-20 ENCOUNTER — Encounter: Payer: Self-pay | Admitting: Family Medicine

## 2016-11-20 ENCOUNTER — Ambulatory Visit (INDEPENDENT_AMBULATORY_CARE_PROVIDER_SITE_OTHER): Payer: BC Managed Care – PPO | Admitting: Family Medicine

## 2016-11-20 VITALS — BP 130/68 | HR 83 | Temp 98.1°F | Resp 16 | Ht 62.0 in | Wt 145.8 lb

## 2016-11-20 DIAGNOSIS — R7303 Prediabetes: Secondary | ICD-10-CM | POA: Diagnosis not present

## 2016-11-20 DIAGNOSIS — I10 Essential (primary) hypertension: Secondary | ICD-10-CM

## 2016-11-20 DIAGNOSIS — F411 Generalized anxiety disorder: Secondary | ICD-10-CM

## 2016-11-20 DIAGNOSIS — G4700 Insomnia, unspecified: Secondary | ICD-10-CM | POA: Diagnosis not present

## 2016-11-20 DIAGNOSIS — E785 Hyperlipidemia, unspecified: Secondary | ICD-10-CM

## 2016-11-20 LAB — CBC WITH DIFFERENTIAL/PLATELET
Basophils Absolute: 0 cells/uL (ref 0–200)
Basophils Relative: 0 %
Eosinophils Absolute: 216 cells/uL (ref 15–500)
Eosinophils Relative: 3 %
HCT: 42.3 % (ref 35.0–45.0)
Hemoglobin: 14.3 g/dL (ref 11.7–15.5)
Lymphocytes Relative: 24 %
Lymphs Abs: 1728 cells/uL (ref 850–3900)
MCH: 32 pg (ref 27.0–33.0)
MCHC: 33.8 g/dL (ref 32.0–36.0)
MCV: 94.6 fL (ref 80.0–100.0)
MPV: 9.2 fL (ref 7.5–12.5)
Monocytes Absolute: 360 cells/uL (ref 200–950)
Monocytes Relative: 5 %
Neutro Abs: 4896 cells/uL (ref 1500–7800)
Neutrophils Relative %: 68 %
Platelets: 351 10*3/uL (ref 140–400)
RBC: 4.47 MIL/uL (ref 3.80–5.10)
RDW: 12.5 % (ref 11.0–15.0)
WBC: 7.2 10*3/uL (ref 3.8–10.8)

## 2016-11-20 MED ORDER — ROSUVASTATIN CALCIUM 5 MG PO TABS: 5.0000 mg | ORAL_TABLET | Freq: Every day | ORAL | 5 refills | Status: DC

## 2016-11-20 MED ORDER — TELMISARTAN-HCTZ 40-12.5 MG PO TABS
1.0000 | ORAL_TABLET | Freq: Every day | ORAL | 5 refills | Status: DC
Start: 2016-11-20 — End: 2017-05-21

## 2016-11-20 NOTE — Progress Notes (Signed)
Name: Jodi Fernandez   MRN: [id number]    DOB: 04-Nov-1955   Date:11/20/2016       Progress Note  Subjective  Chief Complaint  Chief Complaint  Patient presents with  . Medication Refill    6 month F/U  . Hypertension    Cardiologist added Amlodipine to her regimen and heart flutters is improving. Cardiologist performed stress test and monitor all came back normal.  . Hyperlipidemia    Muscle cramps in calves periodically    HPI  HTN: She is taking Micardis/HCTZ 40/12.5 mg also Norvasc 5 mg,  and bp has been under control, she occasionally gets dizzy with new combination, but advised her to stay hydrated and get up slowly.She denies chest pain, no recent palpitation, seen Cardiologist Dr. Sol Blazing and had repeat EKG, stress test was normal and also holter   Hyperlipidemia: she has been taking 5mg  Crestor , and CoQ 10 supplements. She denies myalgias.  She is due for labs.   Insomnia: she retired in January 2016, she is taking care of two grandchildren a 110 and 33 year old, part time. She still has problems staying asleep. Explained likely from anxiety. She states she is doing better, not on any supplementation   Anxiety: she still struggles with anxiety, she worries about her grandchildren, afraid they will get hurt, also does not like being a passenger in a car - husband told her she needs to chill when he is driving. She also gets nervous when driving a different route. She never started Cymbalta. She states she is taking deep breaths, and trying not to worry about everything anymore. Discussed mindfulness.   Pre-diabetes: weight is stable,  she denies polyphagia, polydipsia or polyuria. She is due for labs   Patient Active Problem List   Diagnosis Date Noted  . Tachycardia, paroxysmal (HCC) 07/25/2016  . GAD (generalized anxiety disorder) 11/11/2014  . Insomnia, persistent 11/11/2014  . Dyspareunia 11/11/2014  . Generalized headache 11/11/2014  . Bursitis, trochanteric 11/11/2014   . History of Rocky Mountain spotted fever 04/11/2014  . Dyslipidemia 10/29/2006  . Benign hypertension 10/29/2006  . Allergic rhinitis 10/29/2006    Past Surgical History:  Procedure Laterality Date  . COLONOSCOPY    . TUBAL LIGATION  1987    Family History  Problem Relation Age of Onset  . Heart disease Mother   . Diabetes Mother   . Alcohol abuse Father   . Heart disease Father   . Heart disease Sister   . Hypertension Brother   . Hypertension Brother   . Hypertension Brother   . Hypertension Brother   . Breast cancer Paternal Aunt 20  . Breast cancer Cousin     Social History   Social History  . Marital status: Married    Spouse name: N/A  . Number of children: N/A  . Years of education: N/A   Occupational History  . Not on file.   Social History Main Topics  . Smoking status: Never Smoker  . Smokeless tobacco: Never Used  . Alcohol use No  . Drug use: No  . Sexual activity: Yes    Partners: Female   Other Topics Concern  . 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 She watches them after school about twice a week   Husband is a Chartered loss adjuster, works on cars, but does not  seem to care about their house     Current Outpatient Prescriptions:  .  acetaminophen (TYLENOL) 650 MG CR tablet, Take 650 mg by mouth as needed for pain., Disp: , Rfl:  .  amLODipine (NORVASC) 2.5 MG tablet, Take 1 tablet by mouth daily., Disp: , Rfl:  .  aspirin EC 81 MG tablet, Take 1 tablet (81 mg total) by mouth daily., Disp: 30 tablet, Rfl: 5 .  Coenzyme Q10 (CO Q 10 PO), Take 300 mg by mouth daily., Disp: , Rfl:  .  ibuprofen (ADVIL,MOTRIN) 200 MG tablet, Take 200 mg by mouth every 6 (six) hours as needed. Reported on 11/18/2015, Disp: , Rfl:  .  Multiple Vitamins-Minerals (AIRBORNE PO), Take 1 tablet by mouth as needed. , Disp: , Rfl:  .  rosuvastatin (CRESTOR) 5 MG  tablet, Take 1 tablet (5 mg total) by mouth daily., Disp: 30 tablet, Rfl: 5 .  telmisartan-hydrochlorothiazide (MICARDIS HCT) 40-12.5 MG tablet, Take 1 tablet by mouth daily., Disp: 30 tablet, Rfl: 5  No Known Allergies   ROS  Constitutional: Negative for fever or weight change.  Respiratory: Negative for cough and shortness of breath.   Cardiovascular: Negative for chest pain , no recent episodes of  palpitations.  Gastrointestinal: Negative for abdominal pain, no bowel changes.  Musculoskeletal: Negative for gait problem or joint swelling.  Skin: Negative for rash.  Neurological: Negative for dizziness or headache.  No other specific complaints in a complete review of systems (except as listed in HPI above).  Objective  Vitals:   11/20/16 0921  BP: 130/68  Pulse: 83  Resp: 16  Temp: 98.1 F (36.7 C)  TempSrc: Oral  SpO2: 99%  Weight: 145 lb 12.8 oz (66.1 kg)  Height: 5\' 2"  (1.575 m)    Body mass index is 26.67 kg/m.  Physical Exam  Constitutional: Patient appears well-developed and well-nourished.  No distress.  HEENT: head atraumatic, normocephalic, pupils equal and reactive to light,  neck supple, throat within normal limits Cardiovascular: Normal rate, regular rhythm and normal heart sounds.  No murmur heard. No BLE edema. Pulmonary/Chest: Effort normal and breath sounds normal. No respiratory distress. Abdominal: Soft.  There is no tenderness. Psychiatric: Patient has a normal mood and affect. behavior is normal. Judgment and thought content normal.  PHQ2/9: Depression screen [facility] 2/9 11/20/2016 08/21/2016 07/28/2016 05/22/2016 11/18/2015  Decreased Interest 0 0 0 0 0  Down, Depressed, Hopeless 0 1 1 0 0  PHQ - 2 Score 0 1 1 0 0     Fall Risk: Fall Risk  11/20/2016 08/21/2016 07/28/2016 05/22/2016 11/18/2015  Falls in the past year? No No No No No     Functional Status Survey: Is the patient deaf or have difficulty hearing?: No Does the patient have difficulty  seeing, even when wearing glasses/contacts?: No Does the patient have difficulty concentrating, remembering, or making decisions?: No Does the patient have difficulty walking or climbing stairs?: No Does the patient have difficulty dressing or bathing?: No Does the patient have difficulty doing errands alone such as visiting a doctor's office or shopping?: No    Assessment & Plan  1. Benign hypertension  Seen by Dr. Sol Blazing and Norvasc low dose and is doing well with combination, occasionally gets dizzy, advised to try taking Norvasc at night and to stay hydrated, bp is at goal today - telmisartan-hydrochlorothiazide (MICARDIS HCT) 40-12.5 MG tablet; Take 1 tablet by mouth daily.  Dispense: 30 tablet; Refill: 5 - Thyroid Panel With TSH - COMPLETE  METABOLIC PANEL WITH GFR - CBC with Differential/Platelet  2. Dyslipidemia  - rosuvastatin (CRESTOR) 5 MG tablet; Take 1 tablet (5 mg total) by mouth daily.  Dispense: 30 tablet; Refill: 5 - Lipid panel  3. Insomnia, persistent  Improving   4. Prediabetes  - Hemoglobin A1c - Insulin, fasting  5. GAD (generalized anxiety disorder)  She refuses to take medications, she states she is adjusting better - worse is when riding in the car

## 2016-11-20 NOTE — Patient Instructions (Signed)
[facility]:   Check out Mindfulness

## 2016-11-21 LAB — COMPLETE METABOLIC PANEL WITH GFR
ALT: 9 U/L (ref 6–29)
AST: 20 U/L (ref 10–35)
Albumin: 4.6 g/dL (ref 3.6–5.1)
Alkaline Phosphatase: 101 U/L (ref 33–130)
BUN: 9 mg/dL (ref 7–25)
CO2: 27 mmol/L (ref 20–31)
Calcium: 9.3 mg/dL (ref 8.6–10.4)
Chloride: 103 mmol/L (ref 98–110)
Creat: 0.78 mg/dL (ref 0.50–0.99)
GFR, Est African American: >89 mL/min (ref >=60)
GFR, Est Non African American: 82 mL/min (ref >=60)
Glucose, Bld: 108 mg/dL — ABNORMAL HIGH (ref 65–99)
Potassium: 3.9 mmol/L (ref 3.5–5.3)
Sodium: 140 mmol/L (ref 135–146)
Total Bilirubin: 0.2 mg/dL (ref 0.2–1.2)
Total Protein: 7.4 g/dL (ref 6.1–8.1)

## 2016-11-21 LAB — THYROID PANEL WITH TSH
Free Thyroxine Index: 2.2 (ref 1.4–3.8)
T3 Uptake: 27 % (ref 22–35)
T4, Total: 8.3 ug/dL (ref 4.5–12.0)
TSH: 1.9 mIU/L

## 2016-11-21 LAB — LIPID PANEL
Cholesterol: 184 mg/dL (ref <200)
HDL: 58 mg/dL (ref >50)
LDL Cholesterol: 108 mg/dL — ABNORMAL HIGH (ref <100)
Total CHOL/HDL Ratio: 3.2 Ratio (ref <5.0)
Triglycerides: 91 mg/dL (ref <150)
VLDL: 18 mg/dL (ref <30)

## 2016-11-21 LAB — HEMOGLOBIN A1C
Hgb A1c MFr Bld: 5.9 % — ABNORMAL HIGH (ref <5.7)
Mean Plasma Glucose: 123 mg/dL

## 2016-11-21 LAB — INSULIN, FASTING: Insulin fasting, serum: 17.7 u[IU]/mL (ref 2.0–19.6)

## 2017-02-20 ENCOUNTER — Encounter: Payer: Self-pay | Admitting: Family Medicine

## 2017-02-20 ENCOUNTER — Ambulatory Visit (INDEPENDENT_AMBULATORY_CARE_PROVIDER_SITE_OTHER): Payer: BC Managed Care – PPO | Admitting: Family Medicine

## 2017-02-20 VITALS — BP 106/68 | HR 72 | Temp 97.9°F | Resp 16 | Ht 62.0 in | Wt 146.3 lb

## 2017-02-20 DIAGNOSIS — Z23 Encounter for immunization: Secondary | ICD-10-CM | POA: Diagnosis not present

## 2017-02-20 DIAGNOSIS — I10 Essential (primary) hypertension: Secondary | ICD-10-CM | POA: Diagnosis not present

## 2017-02-20 DIAGNOSIS — I479 Paroxysmal tachycardia, unspecified: Secondary | ICD-10-CM | POA: Diagnosis not present

## 2017-02-20 DIAGNOSIS — R7303 Prediabetes: Secondary | ICD-10-CM

## 2017-02-20 DIAGNOSIS — E785 Hyperlipidemia, unspecified: Secondary | ICD-10-CM | POA: Diagnosis not present

## 2017-02-20 DIAGNOSIS — F411 Generalized anxiety disorder: Secondary | ICD-10-CM

## 2017-02-20 DIAGNOSIS — G4700 Insomnia, unspecified: Secondary | ICD-10-CM

## 2017-02-20 NOTE — Progress Notes (Signed)
Name: Jodi Fernandez   MRN: [id number]    DOB: 1955-12-14   Date:02/20/2017       Progress Note  Subjective  Chief Complaint  Chief Complaint  Patient presents with  . Medication Refill  . Hypertension    Doing well with new medication and taking them at different times. BP is running around 110's/70's at home  . Hyperlipidemia    Muscle cramps in leg occasionally    HPI  HTN: She is taking Micardis/HCTZ 40/12.5 mg also Norvasc 5 mg,  and bp has been low at home, highest of 119/80, but as low as 99/66. She has been feeling tired and has to take half pill of Micardis HCTZ when she knows she will be more active during the day. She denies chest pain, no recent palpitation, seen Cardiologist Dr. Sol Blazing and had multiple tests done back 08/2016. We will stop Norvasc and monitor, continue full dose of Telmisartan/HCTZ  Hyperlipidemia: she has been taking  Crestor , and CoQ 10 supplements. She has occasional muscle cramping when more active than usual. However tolerating well. Advised to stretch and stay hydrated.     Insomnia: she retired in January 2016, she is taking care of two grandchildren a 71 and 74 year old, part time. She still has problems staying asleep. Explained likely from anxiety. She states she is doing better, not on any supplementation   Anxiety: she still struggles with anxiety, she worries about her grandchildren, afraid they will get hurt, also does not like being a passenger in a car - husband told her she needs to chill when he is driving. She also gets nervous when driving a different route. She never started Cymbalta. She states she is taking deep breaths, and trying not to worry about everything anymore. Discussed mindfulness. She states the oldest granchildren diagnosed with asthma and is making her more stressed lately  Pre-diabetes: weight is stable,  she denies polyphagia, polydipsia or polyuria. She does not want to start Metformin  Patient Active Problem List    Diagnosis Date Noted  . Tachycardia, paroxysmal (HCC) 07/25/2016  . GAD (generalized anxiety disorder) 11/11/2014  . Insomnia, persistent 11/11/2014  . Dyspareunia 11/11/2014  . Generalized headache 11/11/2014  . Bursitis, trochanteric 11/11/2014  . History of Rocky Mountain spotted fever 04/11/2014  . Dyslipidemia 10/29/2006  . Benign hypertension 10/29/2006  . Allergic rhinitis 10/29/2006    Past Surgical History:  Procedure Laterality Date  . COLONOSCOPY    . TUBAL LIGATION  1987    Family History  Problem Relation Age of Onset  . Heart disease Mother   . Diabetes Mother   . Alcohol abuse Father   . Heart disease Father   . Heart disease Sister   . Hypertension Brother   . Hypertension Brother   . Hypertension Brother   . Hypertension Brother   . Breast cancer Paternal Aunt 33  . Breast cancer Cousin     Social History   Social History  . Marital status: Married    Spouse name: N/A  . Number of children: N/A  . Years of education: N/A   Occupational History  . Not on file.   Social History Main Topics  . Smoking status: Never Smoker  . Smokeless tobacco: Never Used  . Alcohol use No  . Drug use: No  . Sexual activity: Yes    Partners: Female   Other Topics Concern  . 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 She watches them after school about twice a week   Husband is a Chartered loss adjuster, works on cars, but does not seem to care about their house     Current Outpatient Prescriptions:  .  acetaminophen (TYLENOL) 650 MG CR tablet, Take 650 mg by mouth as needed for pain., Disp: , Rfl:  .  amLODipine (NORVASC) 2.5 MG tablet, Take 1 tablet by mouth daily., Disp: , Rfl:  .  aspirin EC 81 MG tablet, Take 1 tablet (81 mg total) by mouth daily., Disp: 30 tablet, Rfl: 5 .  Coenzyme Q10 (CO Q 10 PO), Take 300 mg by mouth daily., Disp: , Rfl:  .   ibuprofen (ADVIL,MOTRIN) 200 MG tablet, Take 200 mg by mouth every 6 (six) hours as needed. Reported on 11/18/2015, Disp: , Rfl:  .  Multiple Vitamins-Minerals (AIRBORNE PO), Take 1 tablet by mouth as needed. , Disp: , Rfl:  .  rosuvastatin (CRESTOR) 5 MG tablet, Take 1 tablet (5 mg total) by mouth daily., Disp: 30 tablet, Rfl: 5 .  telmisartan-hydrochlorothiazide (MICARDIS HCT) 40-12.5 MG tablet, Take 1 tablet by mouth daily., Disp: 30 tablet, Rfl: 5  No Known Allergies   ROS  Constitutional: Negative for fever or weight change.  Respiratory: Negative for cough and shortness of breath.   Cardiovascular: Negative for chest pain or palpitations.  Gastrointestinal: Negative for abdominal pain, no bowel changes.  Musculoskeletal: Negative for gait problem or joint swelling.  Skin: Negative for rash.  Neurological: Negative for dizziness or headache. She notices that she feels drained when taking full dose of bp medication  No other specific complaints in a complete review of systems (except as listed in HPI above).  Objective  Vitals:   02/20/17 0945  BP: 106/68  Pulse: 72  Resp: 16  Temp: 97.9 F (36.6 C)  TempSrc: Oral  SpO2: 97%  Weight: 146 lb 4.8 oz (66.4 kg)  Height:  (1.575 m)    Body mass index is 26.76 kg/m.  Physical Exam   Constitutional: Patient appears well-developed and well-nourished. Overweight.  No distress.  HEENT: head atraumatic, normocephalic, pupils equal and reactive to light, neck supple, throat within normal limits Cardiovascular: Normal rate, regular rhythm and normal heart sounds.  No murmur heard. No BLE edema. Pulmonary/Chest: Effort normal and breath sounds normal. No respiratory distress. Abdominal: Soft.  There is no tenderness. Psychiatric: Patient has a normal mood and affect. behavior is normal. Judgment and thought content normal.  PHQ2/9: Depression screen [facility] 2/9 02/20/2017 11/20/2016 08/21/2016 07/28/2016 05/22/2016  Decreased  Interest 0 0 0 0 0  Down, Depressed, Hopeless 0 0 1 1 0  PHQ - 2 Score 0 0 1 1 0     Fall Risk: Fall Risk  02/20/2017 11/20/2016 08/21/2016 07/28/2016 05/22/2016  Falls in the past year? No No No No No     Functional Status Survey: Is the patient deaf or have difficulty hearing?: No Does the patient have difficulty seeing, even when wearing glasses/contacts?: No Does the patient have difficulty concentrating, remembering, or making decisions?: No Does the patient have difficulty walking or climbing stairs?: No Does the patient have difficulty dressing or bathing?: No Does the patient have difficulty doing errands alone such as visiting a doctor's office or shopping?: No   Assessment & Plan  1. Tachycardia, paroxysmal (HCC)  Doing well, not on beta-blockers, seen by cardiologist and  normal stress test, Holter.  2. Need for immunization against influenza  - Flu Vaccine QUAD 6+ mos PF IM (Fluarix Quad PF)  3. Benign hypertension  bp is low here and also at home, advised to wean self off of Norvasc to get bp around 130/70  4. Dyslipidemia  Continue statin therapy, last level was much better  5. Insomnia, persistent  Stable and does not want medication  6. Prediabetes  Continue life style modification, discussed Metformin, but she wants to hold off for now  7. GAD (generalized anxiety disorder)  stable

## 2017-05-21 ENCOUNTER — Other Ambulatory Visit: Payer: Self-pay

## 2017-05-21 DIAGNOSIS — E785 Hyperlipidemia, unspecified: Secondary | ICD-10-CM

## 2017-05-21 DIAGNOSIS — I10 Essential (primary) hypertension: Secondary | ICD-10-CM

## 2017-05-21 MED ORDER — ROSUVASTATIN CALCIUM 5 MG PO TABS: 5.0000 mg | ORAL_TABLET | Freq: Every day | ORAL | 2 refills | Status: DC

## 2017-05-21 MED ORDER — AMLODIPINE BESYLATE 2.5 MG PO TABS: 2.5000 mg | ORAL_TABLET | Freq: Every day | ORAL | 2 refills | Status: DC

## 2017-05-21 MED ORDER — TELMISARTAN-HCTZ 40-12.5 MG PO TABS: 1.0000 | ORAL_TABLET | Freq: Every day | ORAL | 2 refills | Status: DC

## 2017-05-21 NOTE — Telephone Encounter (Signed)
Patient is doing well. Patient denies chest pain, palpitations, shortness of breath or visual disturbances. She checked her bp this morning before she took her medications and it was 117/77. She would like to skip the appointment she has for Wednesday. She said you and her discussed skipping appointment if everything was within nml.   Refill request for Hypertension medication:  Amlodipine, Crestor, Micardis HCT  Last office visit pertaining to hypertension: 02/20/2017  Follow up visit: 08/14/2017  BP Readings from Last 3 Encounters:  02/20/17 106/68  11/20/16 130/68  08/21/16 138/86     Lab Results  Component Value Date   CREATININE 0.78 11/20/2016   BUN 9 11/20/2016   NA 140 11/20/2016   K 3.9 11/20/2016   CL 103 11/20/2016   CO2 27 11/20/2016

## 2017-05-23 ENCOUNTER — Ambulatory Visit: Payer: BC Managed Care – PPO | Admitting: Family Medicine

## 2017-08-14 ENCOUNTER — Encounter: Payer: Self-pay | Admitting: Family Medicine

## 2017-08-14 ENCOUNTER — Ambulatory Visit: Payer: BC Managed Care – PPO | Admitting: Family Medicine

## 2017-08-14 VITALS — BP 140/100 | HR 64 | Resp 16 | Ht 62.0 in | Wt 143.3 lb

## 2017-08-14 DIAGNOSIS — H9313 Tinnitus, bilateral: Secondary | ICD-10-CM | POA: Diagnosis not present

## 2017-08-14 DIAGNOSIS — E785 Hyperlipidemia, unspecified: Secondary | ICD-10-CM | POA: Diagnosis not present

## 2017-08-14 DIAGNOSIS — J302 Other seasonal allergic rhinitis: Secondary | ICD-10-CM | POA: Diagnosis not present

## 2017-08-14 DIAGNOSIS — I479 Paroxysmal tachycardia, unspecified: Secondary | ICD-10-CM | POA: Diagnosis not present

## 2017-08-14 DIAGNOSIS — R7303 Prediabetes: Secondary | ICD-10-CM

## 2017-08-14 DIAGNOSIS — F411 Generalized anxiety disorder: Secondary | ICD-10-CM

## 2017-08-14 DIAGNOSIS — I10 Essential (primary) hypertension: Secondary | ICD-10-CM | POA: Diagnosis not present

## 2017-08-14 MED ORDER — ROSUVASTATIN CALCIUM 5 MG PO TABS: 5.0000 mg | ORAL_TABLET | Freq: Every day | ORAL | 2 refills | Status: DC

## 2017-08-14 MED ORDER — AMLODIPINE BESYLATE 2.5 MG PO TABS
2.5000 mg | ORAL_TABLET | Freq: Every day | ORAL | 2 refills | Status: DC
Start: 2017-08-14 — End: 2017-08-14

## 2017-08-14 MED ORDER — TELMISARTAN-HCTZ 40-12.5 MG PO TABS: 1.0000 | ORAL_TABLET | Freq: Every day | ORAL | 2 refills | Status: DC

## 2017-08-14 MED ORDER — FLUTICASONE PROPIONATE 50 MCG/ACT NA SUSP: 2.0000 | Freq: Every day | NASAL | 6 refills | Status: DC

## 2017-08-14 MED ORDER — LORATADINE 10 MG PO TABS: 10.0000 mg | ORAL_TABLET | Freq: Every day | ORAL | 0 refills | Status: DC

## 2017-08-14 MED ORDER — ACETAMINOPHEN ER 650 MG PO TBCR: 650.0000 mg | EXTENDED_RELEASE_TABLET | ORAL | 0 refills | Status: DC | PRN

## 2017-08-14 MED ORDER — AMLODIPINE BESYLATE 2.5 MG PO TABS: 2.5000 mg | ORAL_TABLET | Freq: Every day | ORAL | 2 refills | Status: DC

## 2017-08-14 NOTE — Progress Notes (Signed)
Name: Jodi Fernandez   MRN: [id number]    DOB: Oct 11, 1955   Date:08/14/2017       Progress Note  Subjective  Chief Complaint  Chief Complaint  Patient presents with  . Hypertension    HPI  HTN: She is taking Micardis/HCTZ 40/12.5 mg but stop Norvasc 6 months ago because bp was low. She states that until recently her bp was below 120/80, however over the past few weeks it spiked at home, and it was very high today. It may be secondary to stress, husband purchased a new puppy and grandson is sick.  She denies chest pain, no recent palpitation, seen Cardiologist Dr. Sol Blazing and had multiple tests done back 08/2016 and no symptoms of paroxysmal tachycardia. Advised to monitor bp at home and return in one week to recheck bp next week - nurse visit only.   Tinnitus both ears: going on for the past month, she also states hearing not the same. Two brothers wears hearing aids  AR: she has noticed nose congestion and post-nasal drainage with a mild cough in the mornings, advised to resume nasal steroid and also loratadine for now  Hyperlipidemia: she has been taking 5mg  Crestor , and CoQ 10 supplements. She has occasional muscle cramping when more active than usual. However tolerating well. Advised to stretch and stay hydrated.     Insomnia: she retired in January 2016, she is taking care of two grandchildren part time. She still has problems staying asleep. Explained likely from anxiety. She states she is doing better, not on any supplementation   Anxiety: she still struggles with anxiety, she worries about her grandchildren, afraid they will get hurt, also does not like being a passenger in a car - husband told her she needs to chill when he is driving, she is also upset because husband got a puppy one week ago and she is feeling overwhelmed.  She never started Cymbalta. She states she is taking deep breaths, and trying not to worry about everything anymore. Discussed mindfulness again with patient. .     Pre-diabetes: weight is stable, she denies polyphagia, polydipsia or polyuria. Last hgbA1C 5.8%, we will recheck yearly   Patient Active Problem List   Diagnosis Date Noted  . Tachycardia, paroxysmal (HCC) 07/25/2016  . GAD (generalized anxiety disorder) 11/11/2014  . Insomnia, persistent 11/11/2014  . Dyspareunia 11/11/2014  . Generalized headache 11/11/2014  . Bursitis, trochanteric 11/11/2014  . History of Rocky Mountain spotted fever 04/11/2014  . Dyslipidemia 10/29/2006  . Benign hypertension 10/29/2006  . Allergic rhinitis 10/29/2006    Past Surgical History:  Procedure Laterality Date  . COLONOSCOPY    . TUBAL LIGATION  1987    Family History  Problem Relation Age of Onset  . Heart disease Mother   . Diabetes Mother   . Alcohol abuse Father   . Heart disease Father   . Heart disease Sister   . Hypertension Brother   . Hypertension Brother   . Hypertension Brother   . Hypertension Brother   . Breast cancer Paternal Aunt 67  . Breast cancer Cousin     Social History   Socioeconomic History  . Marital status: Married    Spouse name: Not on file  . Number of children: Not on file  . Years of education: Not on file  . Highest education level: Not on file  Occupational History  . Not on file  Social Needs  . Financial resource strain: Not on file  . Food  insecurity:    Worry: Not on file    Inability: Not on file  . Transportation needs:    Medical: Not on file    Non-medical: Not on file  Tobacco Use  . Smoking status: Never Smoker  . Smokeless tobacco: Never Used  Substance and Sexual Activity  . Alcohol use: No    Alcohol/week: 0.0 oz  . Drug use: No  . Sexual activity: Yes    Partners: Female  Lifestyle  . Physical activity:    Days per week: Not on file    Minutes per session: Not on file  . Stress: Not on file  Relationships  . Social connections:    Talks on phone: Not on file    Gets together: Not on file    Attends religious  service: Not on file    Active member of club or organization: Not on file    Attends meetings of clubs or organizations: Not on file    Relationship status: Not on file  . Intimate partner violence:    Fear of current or ex partner: Not on file    Emotionally abused: Not on file    Physically abused: Not on file    Forced sexual activity: Not on file  Other Topics Concern  . 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 She watches them after school about twice a week   Husband is a Chartered loss adjuster, works on cars, but does not seem to care about their house     Current Outpatient Medications:  .  acetaminophen (TYLENOL) 650 MG CR tablet, Take 1 tablet (650 mg total) by mouth as needed for pain., Disp: 30 tablet, Rfl: 0 .  Coenzyme Q10 (CO Q 10 PO), Take 300 mg by mouth daily., Disp: , Rfl:  .  ibuprofen (ADVIL,MOTRIN) 200 MG tablet, Take 200 mg by mouth every 6 (six) hours as needed. Reported on 11/18/2015, Disp: , Rfl:  .  Multiple Vitamins-Minerals (AIRBORNE PO), Take 1 tablet by mouth as needed. , Disp: , Rfl:  .  rosuvastatin (CRESTOR) 5 MG tablet, Take 1 tablet (5 mg total) by mouth daily., Disp: 30 tablet, Rfl: 2 .  telmisartan-hydrochlorothiazide (MICARDIS HCT) 40-12.5 MG tablet, Take 1 tablet by mouth daily., Disp: 30 tablet, Rfl: 2 .  amLODipine (NORVASC) 2.5 MG tablet, Take 1 tablet (2.5 mg total) by mouth daily., Disp: 30 tablet, Rfl: 2 .  fluticasone (FLONASE) 50 MCG/ACT nasal spray, Place 2 sprays into both nostrils daily., Disp: 16 g, Rfl: 6 .  loratadine (CLARITIN) 10 MG tablet, Take 1 tablet (10 mg total) by mouth daily., Disp: 30 tablet, Rfl: 0  No Known Allergies   ROS  Constitutional: Negative for fever or weight change.  Respiratory: Positive for cough but no  shortness of breath.   Cardiovascular: Negative for chest pain or palpitations.  Gastrointestinal:  Negative for abdominal pain, no bowel changes.  Musculoskeletal: Negative for gait problem or joint swelling.  Skin: Negative for rash.  Neurological: Negative for dizziness or headache.  No other specific complaints in a complete review of systems (except as listed in HPI above).  Objective  Vitals:   08/14/17 0943 08/14/17 1017  BP: (!) 180/100 (!) 140/100  Pulse: 64   Resp: 16   SpO2: 95%   Weight: 143 lb 4.8 oz (65 kg)   Height: 5'  2" (1.575 m)     Body mass index is 26.21 kg/m.  Physical Exam  Constitutional: Patient appears well-developed and well-nourished. Overweight. No distress.  HEENT: head atraumatic, normocephalic, pupils equal and reactive to light, ears : normal T, mild was on both ear canals,  neck supple, throat within normal limits Cardiovascular: Normal rate, regular rhythm and normal heart sounds.  No murmur heard. No BLE edema. Pulmonary/Chest: Effort normal and breath sounds normal. No respiratory distress. Abdominal: Soft.  There is no tenderness. Psychiatric: Patient has a normal mood and affect. behavior is normal. Judgment and thought content normal.  PHQ2/9: [facility] 02/20/2017 11/20/2016 08/21/2016 07/28/2016 05/22/2016  Decreased Interest 0 0 0 0 0  Down, Depressed, Hopeless 0 0 1 1 0  PHQ - 2 Score 0 0 1 1 0     Fall Risk: Fall Risk  02/20/2017 11/20/2016 08/21/2016 07/28/2016 05/22/2016  Falls in the past year? No No No No No     Functional Status Survey: Is the patient deaf or have difficulty hearing?: No Does the patient have difficulty seeing, even when wearing glasses/contacts?: No Does the patient have difficulty concentrating, remembering, or making decisions?: No Does the patient have difficulty walking or climbing stairs?: No Does the patient have difficulty dressing or bathing?: No Does the patient have difficulty doing errands alone such as visiting a doctor's office or shopping?: No    Assessment & Plan  1.  Uncontrolled hypertension  She has been off Norvasc because bp was at goal, bp is high today  - telmisartan-hydrochlorothiazide (MICARDIS HCT) 40-12.5 MG tablet; Take 1 tablet by mouth daily.  Dispense: 30 tablet; Refill: 2 - amLODipine (NORVASC) 2.5 MG tablet; Take 1 tablet (2.5 mg total) by mouth daily.  Dispense: 30 tablet; Refill: 2  2. Dyslipidemia  - rosuvastatin (CRESTOR) 5 MG tablet; Take 1 tablet (5 mg total) by mouth daily.  Dispense: 30 tablet; Refill: 2  3. Tachycardia, paroxysmal (HCC)  Seen by Cardiologist, no recent symptoms  4. GAD (generalized anxiety disorder)  Annoyed with husband, he got a puppy last week even though she said not to it.   5. Prediabetes  Recheck labs yearly  6. Seasonal allergic rhinitis, unspecified trigger  - fluticasone (FLONASE) 50 MCG/ACT nasal spray; Place 2 sprays into both nostrils daily.  Dispense: 16 g; Refill: 6 - loratadine (CLARITIN) 10 MG tablet; Take 1 tablet (10 mg total) by mouth daily.  Dispense: 30 tablet; Refill: 0  7. Bilateral tinnitus  - Ambulatory referral to ENT

## 2017-08-22 ENCOUNTER — Ambulatory Visit (INDEPENDENT_AMBULATORY_CARE_PROVIDER_SITE_OTHER): Payer: BC Managed Care – PPO

## 2017-08-22 VITALS — BP 130/90 | HR 86 | Ht 62.0 in | Wt 143.0 lb

## 2017-08-22 DIAGNOSIS — I10 Essential (primary) hypertension: Secondary | ICD-10-CM

## 2017-08-22 NOTE — Progress Notes (Signed)
Patient is here for a blood pressure check. Patient denies chest pain, palpitations, shortness of breath or visual disturbances. At previous visit blood pressure was 140/100 with a heart rate of 64. Today during nurse visit first check blood pressure was 140/80. After resting for 10 minutes it was 130//90 and heart rate was 86. She continues to take Micardis -HCTZ 40-12.5 mg as prescribed. She has been monitoring her BP intermittently and it has been in the range of 102/76-121/85 with a heart rate in the range of 63-100.

## 2017-08-28 ENCOUNTER — Other Ambulatory Visit: Payer: Self-pay | Admitting: Unknown Physician Specialty

## 2017-08-28 DIAGNOSIS — IMO0001 Reserved for inherently not codable concepts without codable children: Secondary | ICD-10-CM

## 2017-08-28 DIAGNOSIS — H9042 Sensorineural hearing loss, unilateral, left ear, with unrestricted hearing on the contralateral side: Secondary | ICD-10-CM

## 2017-08-31 ENCOUNTER — Other Ambulatory Visit: Payer: Self-pay | Admitting: Unknown Physician Specialty

## 2017-08-31 DIAGNOSIS — H9042 Sensorineural hearing loss, unilateral, left ear, with unrestricted hearing on the contralateral side: Secondary | ICD-10-CM

## 2017-08-31 DIAGNOSIS — IMO0001 Reserved for inherently not codable concepts without codable children: Secondary | ICD-10-CM

## 2017-09-07 ENCOUNTER — Ambulatory Visit
Admission: RE | Admit: 2017-09-07 | Discharge: 2017-09-07 | Disposition: A | Discharge status: Home / Self Care | Payer: BC Managed Care – PPO | Source: Ambulatory Visit | Attending: Unknown Physician Specialty | Admitting: Unknown Physician Specialty

## 2017-09-07 ENCOUNTER — Ambulatory Visit: Payer: BC Managed Care – PPO

## 2017-09-07 DIAGNOSIS — H9042 Sensorineural hearing loss, unilateral, left ear, with unrestricted hearing on the contralateral side: Secondary | ICD-10-CM

## 2017-09-07 DIAGNOSIS — IMO0001 Reserved for inherently not codable concepts without codable children: Secondary | ICD-10-CM

## 2017-09-07 MED ORDER — GADOBENATE DIMEGLUMINE 529 MG/ML IV SOLN
13.0000 mL | Freq: Once | INTRAVENOUS | Status: AC | PRN
  Administered 2017-09-07: 13 mL via INTRAVENOUS

## 2017-11-14 ENCOUNTER — Ambulatory Visit: Payer: BC Managed Care – PPO | Admitting: Family Medicine

## 2017-11-14 ENCOUNTER — Encounter: Payer: Self-pay | Admitting: Family Medicine

## 2017-11-14 VITALS — BP 142/88 | HR 90 | Temp 98.2°F | Resp 16 | Ht 62.0 in | Wt 146.5 lb

## 2017-11-14 DIAGNOSIS — R7303 Prediabetes: Secondary | ICD-10-CM

## 2017-11-14 DIAGNOSIS — I10 Essential (primary) hypertension: Secondary | ICD-10-CM | POA: Diagnosis not present

## 2017-11-14 DIAGNOSIS — G4700 Insomnia, unspecified: Secondary | ICD-10-CM | POA: Diagnosis not present

## 2017-11-14 DIAGNOSIS — I479 Paroxysmal tachycardia, unspecified: Secondary | ICD-10-CM | POA: Diagnosis not present

## 2017-11-14 DIAGNOSIS — H90A12 Conductive hearing loss, unilateral, left ear with restricted hearing on the contralateral side: Secondary | ICD-10-CM | POA: Insufficient documentation

## 2017-11-14 DIAGNOSIS — H9313 Tinnitus, bilateral: Secondary | ICD-10-CM | POA: Insufficient documentation

## 2017-11-14 DIAGNOSIS — J302 Other seasonal allergic rhinitis: Secondary | ICD-10-CM | POA: Diagnosis not present

## 2017-11-14 DIAGNOSIS — E785 Hyperlipidemia, unspecified: Secondary | ICD-10-CM

## 2017-11-14 DIAGNOSIS — F411 Generalized anxiety disorder: Secondary | ICD-10-CM

## 2017-11-14 MED ORDER — AMLODIPINE BESYLATE 2.5 MG PO TABS: 2.5000 mg | ORAL_TABLET | Freq: Every day | ORAL | 2 refills | Status: DC

## 2017-11-14 MED ORDER — TELMISARTAN-HCTZ 40-12.5 MG PO TABS: 1.0000 | ORAL_TABLET | Freq: Every day | ORAL | 2 refills | Status: DC

## 2017-11-14 MED ORDER — ROSUVASTATIN CALCIUM 5 MG PO TABS: 5.0000 mg | ORAL_TABLET | Freq: Every day | ORAL | 2 refills | Status: DC

## 2017-11-14 NOTE — Progress Notes (Signed)
Name: Jodi Fernandez   MRN: [id number]    DOB: 06/08/1955   Date:11/14/2017       Progress Note  Subjective  Chief Complaint  Chief Complaint  Patient presents with  . Medication Refill  . Hypertension    BP has been improving with Amlodipine-BP has been running 117/70. If she is working in the yard- she will take with half of the amlodipine.  . Allergic Rhinitis     Uses a Doterra oils and it helps with allergies  . Anxiety  . Insomnia    Trouble sleeping at night due to husband intrupting her sleep    HPI  HTN: She is taking Micardis/HCTZ 40/12.5 mg and also norvasc 2.5 mg  BP now has been well controlled at home average 115's/60'sShe denies chest pain, she has occasional palpitation and gets occasionally dizzy and bp below 100, trying to stay hydrated.She has  seen cardiologist Dr. Sol Blazing and had multiple tests done back 08/2016 and no symptoms of paroxysmal tachycardia.   Tinnitus both ears: going on for the 5  month, she also states hearing not the same. Two brothers wears hearing aids -they were in the [employer]. She was seen by Dr. Jenne Campus and had hearing test, diagnosed with sensorial neural left hearing loss, MRI brain negative. She was given reassurance.   AR: improved since last visit, she is does not like nasal spray, using Doterra oils.   Hyperlipidemia: she has been taking 5mg  Crestor about 4 times weekly  , and CoQ 10 supplements.She has occasional muscle cramping when more active than usual. However tolerating well. Advised to stretch and stay hydrated.   Insomnia: she retired in January 2016, she is taking care of two grandchildren part time. She still has problems staying asleep. She states usually wakes up when her husband wakes her up during the night, also when she drinks coffee in the evening. She states she is doing better but does not wake up feeling rested. Discussed sleep study to see if she has sleep apnea but she declines to have it done at this time.    Anxiety: she still struggles with anxiety, she worries about her grandchildren, afraid they will get hurt, also does not like being a passenger in a car - husband told her she needs to chill when he is driving, she is also upset because husband got a puppy one week ago and she is feeling overwhelmed.  She never started Cymbalta. She states she is okay, does not want medications    Pre-diabetes: weight is stable, she denies polyphagia, polydipsia or polyuria. Last hgbA1C 5.8%, recheck it today    Patient Active Problem List   Diagnosis Date Noted  . 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 Bilateral tinnitus 11/14/2017  . Tachycardia, paroxysmal (HCC) 07/25/2016  . GAD (generalized anxiety disorder) 11/11/2014  . Insomnia, persistent 11/11/2014  . Dyspareunia 11/11/2014  . Generalized headache 11/11/2014  . Bursitis, trochanteric 11/11/2014  . History of Rocky Mountain spotted fever 04/11/2014  . Dyslipidemia 10/29/2006  . Benign hypertension 10/29/2006  . Allergic rhinitis 10/29/2006    Past Surgical History:  Procedure Laterality Date  . COLONOSCOPY    . TUBAL LIGATION  1987    Family History  Problem Relation Age of Onset  . Heart disease Mother   . Diabetes Mother   . Alcohol abuse Father   . Heart disease Father   . Heart disease Sister   . Hypertension  Brother   . Hypertension Brother   . Hypertension Brother   . Hypertension Brother   . Breast cancer Paternal Aunt 99  . Breast cancer Cousin     Social History   Socioeconomic History  . Marital status: Married    Spouse name: Not on file  . Number of children: Not on file  . Years of education: Not on file  . Highest education level: Not on file  Occupational History  . Not on file  Social Needs  . Financial resource strain: Not on file  . Food insecurity:    Worry: Not on file    Inability: Not on file  . Transportation needs:    Medical: Not on file     Non-medical: Not on file  Tobacco Use  . Smoking status: Never Smoker  . Smokeless tobacco: Never Used  Substance and Sexual Activity  . Alcohol use: No    Alcohol/week: 0.0 oz  . Drug use: No  . Sexual activity: Yes    Partners: Female  Lifestyle  . Physical activity:    Days per week: Not on file    Minutes per session: Not on file  . Stress: Not on file  Relationships  . Social connections:    Talks on phone: Not on file    Gets together: Not on file    Attends religious service: Not on file    Active member of club or organization: Not on file    Attends meetings of clubs or organizations: Not on file    Relationship status: Not on file  . Intimate partner violence:    Fear of current or ex partner: Not on file    Emotionally abused: Not on file    Physically abused: Not on file    Forced sexual activity: Not on file  Other Topics Concern  . 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 She watches them after school about twice a week   Husband is a Chartered loss adjuster, works on cars, but does not seem to care about their house     Current Outpatient Medications:  .  amLODipine (NORVASC) 2.5 MG tablet, Take 1 tablet (2.5 mg total) by mouth daily., Disp: 30 tablet, Rfl: 2 .  Coenzyme Q10 (CO Q 10 PO), Take 300 mg by mouth daily., Disp: , Rfl:  .  ibuprofen (ADVIL,MOTRIN) 200 MG tablet, Take 200 mg by mouth every 6 (six) hours as needed. Reported on 11/18/2015, Disp: , Rfl:  .  Multiple Vitamins-Minerals (AIRBORNE PO), Take 1 tablet by mouth as needed. , Disp: , Rfl:  .  rosuvastatin (CRESTOR) 5 MG tablet, Take 1 tablet (5 mg total) by mouth daily., Disp: 30 tablet, Rfl: 2 .  telmisartan-hydrochlorothiazide (MICARDIS HCT) 40-12.5 MG tablet, Take 1 tablet by mouth daily., Disp: 30 tablet, Rfl: 2 .  acetaminophen (TYLENOL) 650 MG CR tablet, Take 1 tablet (650 mg total) by  mouth as needed for pain. (Patient not taking: Reported on 11/14/2017), Disp: 30 tablet, Rfl: 0 .  fluticasone (FLONASE) 50 MCG/ACT nasal spray, Place 2 sprays into both nostrils daily. (Patient not taking: Reported on 11/14/2017), Disp: 16 g, Rfl: 6 .  loratadine (CLARITIN) 10 MG tablet, Take 1 tablet (10 mg total) by mouth daily. (Patient not taking: Reported on 11/14/2017), Disp: 30 tablet, Rfl: 0  No Known Allergies  ROS  Constitutional: Negative for fever or weight change.  Respiratory: Negative for cough and shortness of breath.   Cardiovascular: Negative for chest pain or palpitations.  Gastrointestinal: Negative for abdominal pain, no bowel changes.  Musculoskeletal: Negative for gait problem or joint swelling.  Skin: Negative for rash.  Neurological: Negative for dizziness or headache.  No other specific complaints in a complete review of systems (except as listed in HPI above).  Objective  Vitals:   11/14/17 1346 11/14/17 1446  BP: (!) 146/90 (!) 142/88  Pulse: 90   Resp: 16   Temp: 98.2 F (36.8 C)   TempSrc: Oral   SpO2: 97%   Weight: 146 lb 8 oz (66.5 kg)   Height: 5\' 2"  (1.575 m)     Body mass index is 26.8 kg/m.  Physical Exam  Constitutional: Patient appears well-developed and well-nourished. Overweight.  No distress.  HEENT: head atraumatic, normocephalic, pupils equal and reactive to light, ears normal TM bilaterally, neck supple, throat within normal limits Cardiovascular: Normal rate, regular rhythm and normal heart sounds.  No murmur heard. No BLE edema. Pulmonary/Chest: Effort normal and breath sounds normal. No respiratory distress. Abdominal: Soft.  There is no tenderness. Psychiatric: Patient has a normal mood and affect. behavior is normal. Judgment and thought content normal.  PHQ2/9: Depression screen [facility] 11/14/2017 02/20/2017 11/20/2016 08/21/2016 07/28/2016  Decreased Interest 1 0 0 0 0  Down, Depressed, Hopeless 1 0 0 1 1  PHQ - 2 Score 2  0 0 1 1  Altered sleeping 1 - - - -  Tired, decreased energy 1 - - - -  Change in appetite 1 - - - -  Feeling bad or failure about yourself  1 - - - -  Trouble concentrating 0 - - - -  Moving slowly or fidgety/restless 0 - - - -  Suicidal thoughts 0 - - - -  PHQ-9 Score 6 - - - -  Difficult doing work/chores Somewhat difficult - - - -     Fall Risk: Fall Risk  11/14/2017 02/20/2017 11/20/2016 08/21/2016 07/28/2016  Falls in the past year? No No No No No     Functional Status Survey: Is the patient deaf or have difficulty hearing?: Yes([facility]-Dr. McQueen-Tinnitus) Does the patient have difficulty seeing, even when wearing glasses/contacts?: Yes(Driving and Reading Glasses) Does the patient have difficulty concentrating, remembering, or making decisions?: No Does the patient have difficulty walking or climbing stairs?: No Does the patient have difficulty dressing or bathing?: No Does the patient have difficulty doing errands alone such as visiting a doctor's office or shopping?: No   Assessment & Plan  1. Benign  hypertension  - amLODipine (NORVASC) 2.5 MG tablet; Take 1 tablet (2.5 mg total) by mouth daily.  Dispense: 30 tablet; Refill: 2 - telmisartan-hydrochlorothiazide (MICARDIS HCT) 40-12.5 MG tablet; Take 1 tablet by mouth daily.  Dispense: 30 tablet; Refill: 2  BP at home is towards low end of normal. She will bring her bp machine for a bp check with nurse next week.   - CBC with Differential/Platelet - COMPLETE METABOLIC PANEL WITH GFR  2. Dyslipidemia  - rosuvastatin (CRESTOR) 5 MG tablet; Take 1 tablet (5 mg total) by mouth daily.  Dispense: 30 tablet; Refill: 2  3. GAD (generalized anxiety disorder)  Worse prior to office visit  4. Tachycardia, paroxysmal (HCC)  - Lipid panel - TSH  5. Seasonal allergic rhinitis, unspecified trigger  stable  6. Prediabetes  - Hemoglobin A1c  7. Insomnia, persistent  She does not want any medications at this  time

## 2017-11-22 ENCOUNTER — Ambulatory Visit: Payer: BC Managed Care – PPO

## 2017-11-23 LAB — CBC WITH DIFFERENTIAL/PLATELET
Basophils Absolute: 43 cells/uL (ref 0–200)
Basophils Relative: 0.7 %
Eosinophils Absolute: 205 cells/uL (ref 15–500)
Eosinophils Relative: 3.3 %
HCT: 41.3 % (ref 35.0–45.0)
Hemoglobin: 14.1 g/dL (ref 11.7–15.5)
Lymphs Abs: 1947 cells/uL (ref 850–3900)
MCH: 32 pg (ref 27.0–33.0)
MCHC: 34.1 g/dL (ref 32.0–36.0)
MCV: 93.9 fL (ref 80.0–100.0)
MPV: 9.6 fL (ref 7.5–12.5)
Monocytes Relative: 6.7 %
Neutro Abs: 3590 cells/uL (ref 1500–7800)
Neutrophils Relative %: 57.9 %
Platelets: 353 10*3/uL (ref 140–400)
RBC: 4.4 10*6/uL (ref 3.80–5.10)
RDW: 12.1 % (ref 11.0–15.0)
Total Lymphocyte: 31.4 %
WBC mixed population: 415 cells/uL (ref 200–950)
WBC: 6.2 10*3/uL (ref 3.8–10.8)

## 2017-11-23 LAB — COMPLETE METABOLIC PANEL WITH GFR
AG Ratio: 2 (calc) (ref 1.0–2.5)
ALT: 8 U/L (ref 6–29)
AST: 17 U/L (ref 10–35)
Albumin: 4.9 g/dL (ref 3.6–5.1)
Alkaline phosphatase (APISO): 107 U/L (ref 33–130)
BUN: 12 mg/dL (ref 7–25)
CO2: 27 mmol/L (ref 20–32)
Calcium: 9.4 mg/dL (ref 8.6–10.4)
Chloride: 103 mmol/L (ref 98–110)
Creat: 0.7 mg/dL (ref 0.50–0.99)
GFR, Est African American: 107 mL/min/{1.73_m2} (ref >=60)
GFR, Est Non African American: 92 mL/min/{1.73_m2} (ref >=60)
Globulin: 2.4 g/dL (calc) (ref 1.9–3.7)
Glucose, Bld: 103 mg/dL — ABNORMAL HIGH (ref 65–99)
Potassium: 3.8 mmol/L (ref 3.5–5.3)
Sodium: 142 mmol/L (ref 135–146)
Total Bilirubin: 0.5 mg/dL (ref 0.2–1.2)
Total Protein: 7.3 g/dL (ref 6.1–8.1)

## 2017-11-23 LAB — LIPID PANEL
Cholesterol: 212 mg/dL — ABNORMAL HIGH (ref <200)
HDL: 57 mg/dL (ref >50)
LDL Cholesterol (Calc): 135 mg/dL (calc) — ABNORMAL HIGH
Non-HDL Cholesterol (Calc): 155 mg/dL (calc) — ABNORMAL HIGH (ref <130)
Total CHOL/HDL Ratio: 3.7 (calc) (ref <5.0)
Triglycerides: 101 mg/dL (ref <150)

## 2017-11-23 LAB — HEMOGLOBIN A1C
Hgb A1c MFr Bld: 5.8 % of total Hgb — ABNORMAL HIGH (ref <5.7)
Mean Plasma Glucose: 120 (calc)
eAG (mmol/L): 6.6 (calc)

## 2017-11-23 LAB — TSH: TSH: 2.88 mIU/L (ref 0.40–4.50)

## 2017-11-29 ENCOUNTER — Ambulatory Visit: Payer: BC Managed Care – PPO

## 2017-11-29 VITALS — BP 146/82 | HR 75

## 2017-11-29 DIAGNOSIS — I10 Essential (primary) hypertension: Secondary | ICD-10-CM

## 2017-11-29 NOTE — Progress Notes (Signed)
Patient is here for a blood pressure check. Patient denies chest pain, palpitations, shortness of breath or visual disturbances. At previous visit blood pressure was 142/88 with a heart rate of 90. Today during nurse visit first check blood pressure was 146/82 Heart Rate 75.   During her Nurse Visit: She brought her machine and it read 138/93 on her right arm,  Pulse 67. Dr. Carlynn PurlSowles informed the patient to keep taking medication as prescribed and it is her white coat syndrome causing her BP to be elevated.

## 2018-02-19 ENCOUNTER — Encounter: Payer: Self-pay | Admitting: Family Medicine

## 2018-02-19 ENCOUNTER — Ambulatory Visit: Payer: BC Managed Care – PPO | Admitting: Family Medicine

## 2018-02-19 VITALS — BP 116/76 | HR 90 | Temp 98.0°F | Resp 16 | Ht 62.0 in | Wt 144.2 lb

## 2018-02-19 DIAGNOSIS — I10 Essential (primary) hypertension: Secondary | ICD-10-CM

## 2018-02-19 DIAGNOSIS — Z23 Encounter for immunization: Secondary | ICD-10-CM | POA: Diagnosis not present

## 2018-02-19 DIAGNOSIS — F411 Generalized anxiety disorder: Secondary | ICD-10-CM

## 2018-02-19 DIAGNOSIS — R7303 Prediabetes: Secondary | ICD-10-CM | POA: Diagnosis not present

## 2018-02-19 DIAGNOSIS — I479 Paroxysmal tachycardia, unspecified: Secondary | ICD-10-CM

## 2018-02-19 DIAGNOSIS — E785 Hyperlipidemia, unspecified: Secondary | ICD-10-CM

## 2018-02-19 MED ORDER — TELMISARTAN-HCTZ 40-12.5 MG PO TABS: 1.0000 | ORAL_TABLET | Freq: Every day | ORAL | 5 refills | Status: DC

## 2018-02-19 MED ORDER — ROSUVASTATIN CALCIUM 5 MG PO TABS: 5.0000 mg | ORAL_TABLET | ORAL | 1 refills | Status: DC

## 2018-02-19 NOTE — Progress Notes (Signed)
Name: Jodi Fernandez   MRN: [id number]    DOB: 1955-05-20   Date:02/19/2018       Progress Note  Subjective  Chief Complaint  Chief Complaint  Patient presents with  . Hypertension  . Anxiety  . Tachycardia  . Allergic Rhinitis   . prediabetes  . Insomnia    HPI  HTN: She is taking Micardis/HCTZ 40/12.5 mgand also norvasc 2.5 mg  BP improved and she is off Norvasc, SBP at home 106-154/66-94 average 120's/80's. No chest pain and palpitation has resolved, she has a history of paroxysmal tachycardia.   Tinnitus both ears: going on for the 8  month, she also states hearing not the same. Two brothers wears hearing aids -they were in the [employer]. She was seen by Dr. Jenne Campus and had hearing test, diagnosed with sensorial neural left hearing loss, MRI brain negative. She was given reassurance.   AR: improved since last visit, she is does not like nasal spray, using Doterra oils. No currently any medication   Hyperlipidemia: she has been taking 5mg  Crestor about 3 times weekly  , and CoQ 10 supplements.She has occasional muscle cramping when more active than usual., states last leg cramp was on her hamstring, discussed stretching exercises. Last LDL was 135  Insomnia: she retired in January 2016, she is taking care of two grandchildren part time. She still has problems staying asleep. She states usually wakes up when her husband wakes her up during the night, also when she drinks coffee in the evening. She states she is doing better but does not wake up feeling rested. Discussed sleep study to see if she has sleep apnea, she still does not want to have study done   Anxiety: she still struggles with anxiety, she worries about her grandchildren, afraid they will get hurt, also does not like being a passenger in a car - husband told her she needs to chill when he is driving, she is doing better, does not want medication, never started Duloxetine given to her this past Summer.    Pre-diabetes: weight is stable, she denies polyphagia, polydipsia or polyuria. Last hgbA1C stable.    Patient Active Problem List   Diagnosis Date Noted  . 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 Bilateral tinnitus 11/14/2017  . Tachycardia, paroxysmal (HCC) 07/25/2016  . GAD (generalized anxiety disorder) 11/11/2014  . Insomnia, persistent 11/11/2014  . Dyspareunia 11/11/2014  . Generalized headache 11/11/2014  . Bursitis, trochanteric 11/11/2014  . History of [place] spotted fever 04/11/2014  . Dyslipidemia 10/29/2006  . Benign hypertension 10/29/2006  . Allergic rhinitis 10/29/2006    Past Surgical History:  Procedure Laterality Date  . COLONOSCOPY    . TUBAL LIGATION  1987    Family History  Problem Relation Age of Onset  . Heart disease Mother   . Diabetes Mother   . Alcohol abuse Father   . Heart disease Father   . Heart disease Sister   . Hypertension Brother   . Hypertension Brother   . Hypertension Brother   . Hypertension Brother   . Breast cancer Paternal Aunt 25  . Breast cancer Cousin     Social History   Socioeconomic History  . Marital status: Married    Spouse name: Brett Canales  . Number of children: 2  . Years of education: Not on file  . Highest education level: Associate degree: occupational, Scientist, product/process development, or vocational program  Occupational History  . Occupation: retired  Comment: Geologist, engineering  . Occupation: Lawyer  Social Needs  . Financial resource strain: Not very hard  . Food insecurity:    Worry: Never true    Inability: Never true  . Transportation needs:    Medical: No    Non-medical: No  Tobacco Use  . Smoking status: Never Smoker  . Smokeless tobacco: Never Used  Substance and Sexual Activity  . Alcohol use: No    Alcohol/week: 0.0 standard drinks  . Drug use: No  . Sexual activity: Yes    Partners: Female  Lifestyle  . Physical activity:    Days  per week: 0 days    Minutes per session: 0 min  . Stress: Only a little  Relationships  . Social connections:    Talks on phone: More than three times a week    Gets together: More than three times a week    Attends religious service: More than 4 times per year    Active member of club or organization: Yes    Attends meetings of clubs or organizations: More than 4 times per year    Relationship status: Married  . Intimate partner violence:    Fear of current or ex partner: No    Emotionally abused: No    Physically abused: No    Forced sexual activity: No  Other Topics Concern  . 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 She watches them after school about twice a week   Husband is a Chartered loss adjuster, works on cars, but does not seem to care about their house     Current Outpatient Medications:  .  [START ON 02/20/2018] rosuvastatin (CRESTOR) 5 MG tablet, Take 1 tablet (5 mg total) by mouth 3 (three) times a week., Disp: 36 tablet, Rfl: 1 .  telmisartan-hydrochlorothiazide (MICARDIS HCT) 40-12.5 MG tablet, Take 1 tablet by mouth daily., Disp: 30 tablet, Rfl: 5 .  acetaminophen (TYLENOL) 650 MG CR tablet, Take 1 tablet (650 mg total) by mouth as needed for pain. (Patient not taking: Reported on 11/14/2017), Disp: 30 tablet, Rfl: 0 .  Coenzyme Q10 (CO Q 10 PO), Take 300 mg by mouth daily., Disp: , Rfl:  .  fluticasone (FLONASE) 50 MCG/ACT nasal spray, Place 2 sprays into both nostrils daily. (Patient not taking: Reported on 11/14/2017), Disp: 16 g, Rfl: 6 .  ibuprofen (ADVIL,MOTRIN) 200 MG tablet, Take 200 mg by mouth every 6 (six) hours as needed. Reported on 11/18/2015, Disp: , Rfl:  .  loratadine (CLARITIN) 10 MG tablet, Take 1 tablet (10 mg total) by mouth daily. (Patient not taking: Reported  on 11/14/2017), Disp: 30 tablet, Rfl: 0 .  Multiple Vitamins-Minerals (AIRBORNE PO), Take 1 tablet by mouth as needed. , Disp: , Rfl:   No Known Allergies  I personally reviewed active problem list, medication list, allergies, family history, social history with the patient/caregiver today.   ROS  Constitutional: Negative for fever or weight change.  Respiratory: Negative for cough and shortness of breath.   Cardiovascular: Negative for chest pain or palpitations.  Gastrointestinal: Negative for abdominal pain, no bowel changes.  Musculoskeletal: Negative for gait problem or joint swelling.  Skin: Negative for rash.  Neurological: Negative for dizziness or headache.  No other specific complaints in  a complete review of systems (except as listed in HPI above).   Objective  Vitals:   02/19/18 0941 02/19/18 1006  BP: (!) 142/84 116/76  Pulse: 90   Resp: 16   Temp: 98 F (36.7 C)   TempSrc: Oral   SpO2: 96%   Weight: 144 lb 3.2 oz (65.4 kg)   Height: 5\' 2"  (1.575 m)     Body mass index is 26.37 kg/m.  Physical Exam  Constitutional: Patient appears well-developed and well-nourished. Overweight.  No distress.  HEENT: head atraumatic, normocephalic, pupils equal and reactive to light, neck supple, throat within normal limits Cardiovascular: Normal rate, regular rhythm and normal heart sounds.  No murmur heard. No BLE edema. Pulmonary/Chest: Effort normal and breath sounds normal. No respiratory distress. Abdominal: Soft.  There is no tenderness. Psychiatric: Patient has a normal mood and affect. behavior is normal. Judgment and thought content normal.  Recent Results (from the past 2160 hour(s))  CBC with Differential/Platelet     Status: None   Collection Time: 11/22/17 10:00 AM  Result Value Ref Range   WBC 6.2 3.8 - 10.8 Thousand/uL   RBC 4.40 3.80 - 5.10 Million/uL   Hemoglobin 14.1 11.7 - 15.5 g/dL   HCT 16.1 09.6 - 04.5 %   MCV 93.9 80.0 - 100.0 fL   MCH 32.0  27.0 - 33.0 pg   MCHC 34.1 32.0 - 36.0 g/dL   RDW 40.9 81.1 - 91.4 %   Platelets 353 140 - 400 Thousand/uL   MPV 9.6 7.5 - 12.5 fL   Neutro Abs 3,590 1,500 - 7,800 cells/uL   Lymphs Abs 1,947 850 - 3,900 cells/uL   WBC mixed population 415 200 - 950 cells/uL   Eosinophils Absolute 205 15 - 500 cells/uL   Basophils Absolute 43 0 - 200 cells/uL   Neutrophils Relative % 57.9 %   Total Lymphocyte 31.4 %   Monocytes Relative 6.7 %   Eosinophils Relative 3.3 %   Basophils Relative 0.7 %  COMPLETE METABOLIC PANEL WITH GFR     Status: Abnormal   Collection Time: 11/22/17 10:00 AM  Result Value Ref Range   Glucose, Bld 103 (H) 65 - 99 mg/dL    Comment: .            Fasting reference interval . For someone without known diabetes, a glucose value between 100 and 125 mg/dL is consistent with prediabetes and should be confirmed with a follow-up test. .    BUN 12 7 - 25 mg/dL   Creat 7.82 9.56 - 2.13 mg/dL    Comment: For patients >59 years of age, the reference limit for Creatinine is approximately 13% higher for people identified as African-American. .    GFR, Est Non African American 92 > OR = 60 mL/min/1.65m2   GFR, Est African American 107 > OR = 60 mL/min/1.76m2   BUN/Creatinine Ratio NOT APPLICABLE 6 - 22 (calc)   Sodium 142 135 - 146 mmol/L   Potassium 3.8 3.5 - 5.3 mmol/L   Chloride 103 98 - 110 mmol/L   CO2 27 20 - 32 mmol/L   Calcium 9.4 8.6 - 10.4 mg/dL   Total Protein 7.3 6.1 - 8.1 g/dL   Albumin 4.9 3.6 - 5.1 g/dL   Globulin 2.4 1.9 - 3.7 g/dL (calc)   AG Ratio 2.0 1.0 - 2.5 (calc)   Total Bilirubin 0.5 0.2 - 1.2 mg/dL   Alkaline phosphatase (APISO) 107 33 - 130 U/L   AST 17 10 -  35 U/L   ALT 8 6 - 29 U/L  Hemoglobin A1c     Status: Abnormal   Collection Time: 11/22/17 10:00 AM  Result Value Ref Range   Hgb A1c MFr Bld 5.8 (H) <5.7 % of total Hgb    Comment: For someone without known diabetes, a hemoglobin  A1c value between 5.7% and 6.4% is consistent  with prediabetes and should be confirmed with a  follow-up test. . For someone with known diabetes, a value <7% indicates that their diabetes is well controlled. A1c targets should be individualized based on duration of diabetes, age, comorbid conditions, and other considerations. . This assay result is consistent with an increased risk of diabetes. . Currently, no consensus exists regarding use of hemoglobin A1c for diagnosis of diabetes for children. .    Mean Plasma Glucose 120 (calc)   eAG (mmol/L) 6.6 (calc)  Lipid panel     Status: Abnormal   Collection Time: 11/22/17 10:00 AM  Result Value Ref Range   Cholesterol 212 (H) <200 mg/dL   HDL 57 >16 mg/dL   Triglycerides 109 <604 mg/dL   LDL Cholesterol (Calc) 135 (H) mg/dL (calc)    Comment: Reference range: <100 . Desirable range <100 mg/dL for primary prevention;   <70 mg/dL for patients with CHD or diabetic patients  with > or = 2 CHD risk factors. Marland Kitchen LDL-C is now calculated using the Martin-Hopkins  calculation, which is a validated novel method providing  better accuracy than the Friedewald equation in the  estimation of LDL-C.  Horald Pollen et al. Lenox Ahr. 5409;811(91): 2061-2068  (http://education.[facility].com/faq/FAQ164)    Total CHOL/HDL Ratio 3.7 <5.0 (calc)   Non-HDL Cholesterol (Calc) 155 (H) <130 mg/dL (calc)    Comment: For patients with diabetes plus 1 major ASCVD risk  factor, treating to a non-HDL-C goal of <100 mg/dL  (LDL-C of <47 mg/dL) is considered a therapeutic  option.   TSH     Status: None   Collection Time: 11/22/17 10:00 AM  Result Value Ref Range   TSH 2.88 0.40 - 4.50 mIU/L     PHQ2/9: Depression screen [facility] 2/9 11/14/2017 02/20/2017 11/20/2016 08/21/2016 07/28/2016  Decreased Interest 1 0 0 0 0  Down, Depressed, Hopeless 1 0 0 1 1  PHQ - 2 Score 2 0 0 1 1  Altered sleeping 1 - - - -  Tired, decreased energy 1 - - - -  Change in appetite 1 - - - -  Feeling bad or failure about  yourself  1 - - - -  Trouble concentrating 0 - - - -  Moving slowly or fidgety/restless 0 - - - -  Suicidal thoughts 0 - - - -  PHQ-9 Score 6 - - - -  Difficult doing work/chores Somewhat difficult - - - -     Fall Risk: Fall Risk  11/14/2017 02/20/2017 11/20/2016 08/21/2016 07/28/2016  Falls in the past year? No No No No No     Assessment & Plan  1. Dyslipidemia  - rosuvastatin (CRESTOR) 5 MG tablet; Take 1 tablet (5 mg total) by mouth 3 (three) times a week.  Dispense: 36 tablet; Refill: 1  2. Need for influenza vaccination  - Flu Vaccine QUAD 6+ mos PF IM (Fluarix Quad PF)  3. Benign hypertension  - telmisartan-hydrochlorothiazide (MICARDIS HCT) 40-12.5 MG tablet; Take 1 tablet by mouth daily.  Dispense: 30 tablet; Refill: 5  4. Prediabetes  Discussed life style modification   5. GAD (generalized anxiety disorder)  Improving   6. Tachycardia, paroxysmal (HCC)  No recent episodes

## 2018-06-06 ENCOUNTER — Encounter: Payer: Self-pay | Admitting: Family Medicine

## 2018-06-06 ENCOUNTER — Other Ambulatory Visit (HOSPITAL_COMMUNITY)
Admission: RE | Admit: 2018-06-06 | Discharge: 2018-06-06 | Disposition: A | Discharge status: Home / Self Care | Payer: BC Managed Care – PPO | Source: Ambulatory Visit | Attending: Family Medicine | Admitting: Family Medicine

## 2018-06-06 ENCOUNTER — Ambulatory Visit (INDEPENDENT_AMBULATORY_CARE_PROVIDER_SITE_OTHER): Payer: BC Managed Care – PPO | Admitting: Family Medicine

## 2018-06-06 VITALS — BP 160/100 | HR 83 | Temp 97.9°F | Resp 16 | Ht 62.0 in | Wt 143.9 lb

## 2018-06-06 DIAGNOSIS — M722 Plantar fascial fibromatosis: Secondary | ICD-10-CM

## 2018-06-06 DIAGNOSIS — Z23 Encounter for immunization: Secondary | ICD-10-CM

## 2018-06-06 DIAGNOSIS — Z1239 Encounter for other screening for malignant neoplasm of breast: Secondary | ICD-10-CM | POA: Diagnosis not present

## 2018-06-06 DIAGNOSIS — Z124 Encounter for screening for malignant neoplasm of cervix: Secondary | ICD-10-CM

## 2018-06-06 DIAGNOSIS — I10 Essential (primary) hypertension: Secondary | ICD-10-CM | POA: Diagnosis not present

## 2018-06-06 DIAGNOSIS — Z780 Asymptomatic menopausal state: Secondary | ICD-10-CM

## 2018-06-06 DIAGNOSIS — I479 Paroxysmal tachycardia, unspecified: Secondary | ICD-10-CM

## 2018-06-06 DIAGNOSIS — Z01419 Encounter for gynecological examination (general) (routine) without abnormal findings: Secondary | ICD-10-CM

## 2018-06-06 MED ORDER — HYDRALAZINE HCL 10 MG PO TABS: 10.0000 mg | ORAL_TABLET | Freq: Three times a day (TID) | ORAL | 0 refills | Status: DC

## 2018-06-06 NOTE — Patient Instructions (Addendum)
You can go online or Sports store and look for [employer] can also get otc heel cups for other shoes  Hydralazine it to take prn only for bp above 150/90  Plantar Fasciitis  Plantar fasciitis is a painful foot condition that affects the heel. It occurs when the band of tissue that connects the toes to the heel bone (plantar fascia) becomes irritated. This can happen as the result of exercising too much or doing other repetitive activities (overuse injury). The pain from plantar fasciitis can range from mild irritation to severe pain that makes it difficult to walk or move. The pain is usually worse in the morning after sleeping, or after sitting or lying down for a while. Pain may also be worse after long periods of walking or standing. What are the causes? This condition may be caused by:  Standing for long periods of time.  Wearing shoes that do not have good arch support.  Doing activities that put stress on joints (high-impact activities), including running, aerobics, and ballet.  Being overweight.  An abnormal way of walking (gait).  Tight muscles in the back of your lower leg (calf).  High arches in your feet.  Starting a new athletic activity. What are the signs or symptoms? The main symptom of this condition is heel pain. Pain may:  Be worse with first steps after a time of rest, especially in the morning after sleeping or after you have been sitting or lying down for a while.  Be worse after long periods of standing still.  Decrease after 30-45 minutes of activity, such as gentle walking. How is this diagnosed? This condition may be diagnosed based on your medical history and your symptoms. Your health care provider may ask questions about your activity level. Your health care provider will do a physical exam to check for:  A tender area on the bottom of your foot.  A high arch in your foot.  Pain when you move your foot.  Difficulty moving your foot. You may have  imaging tests to confirm the diagnosis, such as:  X-rays.  Ultrasound.  MRI. How is this treated? Treatment for plantar fasciitis depends on how severe your condition is. Treatment may include:  Rest, ice, applying pressure (compression), and raising the affected foot (elevation). This may be called RICE therapy. Your health care provider may recommend RICE therapy along with over-the-counter pain medicines to manage your pain.  Exercises to stretch your calves and your plantar fascia.  A splint that holds your foot in a stretched, upward position while you sleep (night splint).  Physical therapy to relieve symptoms and prevent problems in the future.  Injections of steroid medicine (cortisone) to relieve pain and inflammation.  Stimulating your plantar fascia with electrical impulses (extracorporeal shock wave therapy). This is usually the last treatment option before surgery.  Surgery, if other treatments have not worked after 12 months. Follow these instructions at home:  Managing pain, stiffness, and swelling  If directed, put ice on the painful area: ? Put ice in a plastic bag, or use a frozen bottle of water. ? Place a towel between your skin and the bag or bottle. ? Roll the bottom of your foot over the bag or bottle. ? Do this for 20 minutes, 2-3 times a day.  Wear athletic shoes that have air-sole or gel-sole cushions, or try wearing soft shoe inserts that are designed for plantar fasciitis.  Raise (elevate) your foot above the level of your heart while  you are sitting or lying down. Activity  Avoid activities that cause pain. Ask your health care provider what activities are safe for you.  Do physical therapy exercises and stretches as told by your health care provider.  Try activities and forms of exercise that are easier on your joints (low-impact). Examples include swimming, water aerobics, and biking. General instructions  Take over-the-counter and  prescription medicines only as told by your health care provider.  Wear a night splint while sleeping, if told by your health care provider. Loosen the splint if your toes tingle, become numb, or turn cold and blue.  Maintain a healthy weight, or work with your health care provider to lose weight as needed.  Keep all follow-up visits as told by your health care provider. This is important. Contact a health care provider if you:  Have symptoms that do not go away after caring for yourself at home.  Have pain that gets worse.  Have pain that affects your ability to move or do your daily activities. Summary  Plantar fasciitis is a painful foot condition that affects the heel. It occurs when the band of tissue that connects the toes to the heel bone (plantar fascia) becomes irritated.  The main symptom of this condition is heel pain that may be worse after exercising too much or standing still for a long time.  Treatment varies, but it usually starts with rest, ice, compression, and elevation (RICE therapy) and over-the-counter medicines to manage pain. This information is not intended to replace advice given to you by your health care provider. Make sure you discuss any questions you have with your health care provider. Document Released: 01/17/2001 Document Revised: 02/19/2017 Document Reviewed: 02/19/2017 Elsevier Interactive Patient Education  2019 [place] Years, Female Preventive care refers to lifestyle choices and visits with your health care provider that can promote health and wellness. What does preventive care include?   A yearly physical exam. This is also called an annual well check.  Dental exams once or twice a year.  Routine eye exams. Ask your health care provider how often you should have your eyes checked.  Personal lifestyle choices, including: ? Daily care of your teeth and gums. ? Regular physical activity. ? Eating a healthy  diet. ? Avoiding tobacco and drug use. ? Limiting alcohol use. ? Practicing safe sex. ? Taking low-dose aspirin daily starting at age 56. ? Taking vitamin and mineral supplements as recommended by your health care provider. What happens during an annual well check? The services and screenings done by your health care provider during your annual well check will depend on your age, overall health, lifestyle risk factors, and family history of disease. Counseling Your health care provider may ask you questions about your:  Alcohol use.  Tobacco use.  Drug use.  Emotional well-being.  Home and relationship well-being.  Sexual activity.  Eating habits.  Work and work Statistician.  Method of birth control.  Menstrual cycle.  Pregnancy history. Screening You may have the following tests or measurements:  Height, weight, and BMI.  Blood pressure.  Lipid and cholesterol levels. These may be checked every 5 years, or more frequently if you are over 71 years old.  Skin check.  Lung cancer screening. You may have this screening every year starting at age 51 if you have a 30-pack-year history of smoking and currently smoke or have quit within the past 15 years.  Colorectal cancer screening. All adults should have this  screening starting at age 21 and continuing until age 59. Your health care provider may recommend screening at age 49. You will have tests every 1-10 years, depending on your results and the type of screening test. People at increased risk should start screening at an earlier age. Screening tests may include: ? Guaiac-based fecal occult blood testing. ? Fecal immunochemical test (FIT). ? Stool DNA test. ? Virtual colonoscopy. ? Sigmoidoscopy. During this test, a flexible tube with a tiny camera (sigmoidoscope) is used to examine your rectum and lower colon. The sigmoidoscope is inserted through your anus into your rectum and lower colon. ? Colonoscopy. During this  test, a long, thin, flexible tube with a tiny camera (colonoscope) is used to examine your entire colon and rectum.  Hepatitis C blood test.  Hepatitis B blood test.  Sexually transmitted disease (STD) testing.  Diabetes screening. This is done by checking your blood sugar (glucose) after you have not eaten for a while (fasting). You may have this done every 1-3 years.  Mammogram. This may be done every 1-2 years. Talk to your health care provider about when you should start having regular mammograms. This may depend on whether you have a family history of breast cancer.  BRCA-related cancer screening. This may be done if you have a family history of breast, ovarian, tubal, or peritoneal cancers.  Pelvic exam and Pap test. This may be done every 3 years starting at age 22. Starting at age 25, this may be done every 5 years if you have a Pap test in combination with an HPV test.  Bone density scan. This is done to screen for osteoporosis. You may have this scan if you are at high risk for osteoporosis. Discuss your test results, treatment options, and if necessary, the need for more tests with your health care provider. Vaccines Your health care provider may recommend certain vaccines, such as:  Influenza vaccine. This is recommended every year.  Tetanus, diphtheria, and acellular pertussis (Tdap, Td) vaccine. You may need a Td booster every 10 years.  Varicella vaccine. You may need this if you have not been vaccinated.  Zoster vaccine. You may need this after age 59.  Measles, mumps, and rubella (MMR) vaccine. You may need at least one dose of MMR if you were born in 1957 or later. You may also need a second dose.  Pneumococcal 13-valent conjugate (PCV13) vaccine. You may need this if you have certain conditions and were not previously vaccinated.  Pneumococcal polysaccharide (PPSV23) vaccine. You may need one or two doses if you smoke cigarettes or if you have certain  conditions.  Meningococcal vaccine. You may need this if you have certain conditions.  Hepatitis A vaccine. You may need this if you have certain conditions or if you travel or work in places where you may be exposed to hepatitis A.  Hepatitis B vaccine. You may need this if you have certain conditions or if you travel or work in places where you may be exposed to hepatitis B.  Haemophilus influenzae type b (Hib) vaccine. You may need this if you have certain conditions. Talk to your health care provider about which screenings and vaccines you need and how often you need them. This information is not intended to replace advice given to you by your health care provider. Make sure you discuss any questions you have with your health care provider. Document Released: 05/21/2015 Document Revised: 06/14/2017 Document Reviewed: 02/23/2015 Elsevier Interactive Patient Education  2019 [employer].

## 2018-06-06 NOTE — Progress Notes (Signed)
Name: Jodi Fernandez   MRN: [id number]    DOB: Jul 19, 1955   Date:06/06/2018       Progress Note  Subjective  Chief Complaint  Chief Complaint  Patient presents with  . Annual Exam  . Hypertension  . Foot Pain    HPI   Patient presents for annual CPE and follow up.  HTN: bp at home has been 115-120's/70's-80's, but she has not checked it lately. She denies chest pain or palpitation lately. She has a history of paroxysmal tachycardia, but could not tolerate medication to control rate, trying to decrease caffeine intake. BP today was high, we will try prn medication, she states worries about her daughter's constantly and one of them is moving to another state soon.   Right heel pain: she states over the past couple of weeks she has noticed pain on right heel when she first gets up in am, improves after she walks for a little bit, no swelling noticed  Diet:trying to eat healthy, her husband is diabetic. She eats a lot of vegetables, fruit.  Exercise: walking occasionally   USPSTF grade A and B recommendations    Office Visit from 06/06/2018 in [facility]  AUDIT-C Score  0     Depression:  Depression screen [facility] 2/9 06/06/2018 11/14/2017 02/20/2017 11/20/2016 08/21/2016  Decreased Interest 1 1 0 0 0  Down, Depressed, Hopeless 1 1 0 0 1  PHQ - 2 Score 2 2 0 0 1  Altered sleeping 0 1 - - -  Tired, decreased energy 1 1 - - -  Change in appetite 0 1 - - -  Feeling bad or failure about yourself  1 1 - - -  Trouble concentrating 0 0 - - -  Moving slowly or fidgety/restless 0 0 - - -  Suicidal thoughts 0 0 - - -  PHQ-9 Score 4 6 - - -  Difficult doing work/chores Not difficult at all Somewhat difficult - - -   Hypertension: BP Readings from Last 3 Encounters:  06/06/18 (!) 160/100  02/19/18 116/76  11/29/17 (!) 146/82   Obesity: Wt Readings from Last 3 Encounters:  06/06/18 143 lb 14.4 oz (65.3 kg)  02/19/18 144 lb 3.2 oz (65.4 kg)  11/14/17 146 lb 8 oz (66.5  kg)   BMI Readings from Last 3 Encounters:  06/06/18 26.32 kg/m  02/19/18 26.37 kg/m  11/14/17 26.80 kg/m    Hep C Screening: 2014 STD testing and prevention (HIV/chl/gon/syphilis): N/A Intimate partner violence: negative screen  Sexual History/Pain during Intercourse:  Menstrual History/LMP/Abnormal Bleeding: discussed post-menopausal bleeding  Incontinence Symptoms: only with severe coughing spells, discussed kegel   Advanced Care Planning: A voluntary discussion about advance care planning including the explanation and discussion of advance directives.  Discussed health care proxy and Living will, and the patient was able to identify a health care proxy as husband .  Patient does not have a living will at present time.   Breast cancer:  HM Mammogram  Date Value Ref Range Status  11/07/2011 Normal  Final    BRCA gene screening: N/A Cervical cancer screening:today   Osteoporosis Screening: bone density   Lipids:  Lab Results  Component Value Date   CHOL 212 (H) 11/22/2017   CHOL 184 11/20/2016   CHOL 220 (H) 01/11/2016   Lab Results  Component Value Date   HDL 57 11/22/2017   HDL 58 11/20/2016   HDL 51 01/11/2016   Lab Results  Component Value Date  LDLCALC 135 (H) 11/22/2017   LDLCALC 108 (H) 11/20/2016   LDLCALC 138 (H) 01/11/2016   Lab Results  Component Value Date   TRIG 101 11/22/2017   TRIG 91 11/20/2016   TRIG 156 (H) 01/11/2016   Lab Results  Component Value Date   CHOLHDL 3.7 11/22/2017   CHOLHDL 3.2 11/20/2016   CHOLHDL 4.3 01/11/2016   No results found for: LDLDIRECT  Glucose:  Glucose, Bld  Date Value Ref Range Status  11/22/2017 103 (H) 65 - 99 mg/dL Final    Comment:    .            Fasting reference interval . For someone without known diabetes, a glucose value between 100 and 125 mg/dL is consistent with prediabetes and should be confirmed with a follow-up test. .   11/20/2016 108 (H) 65 - 99 mg/dL Final  01/11/2016 109  (H) 65 - 99 mg/dL Final    Skin cancer: discussed atypical lesions  Colorectal cancer: up to date  Lung cancer:   Low Dose CT Chest recommended if Age 37-80 years, 30 pack-year currently smoking OR have quit w/in 15years. Patient does not qualify.   ECG: 2018  Patient Active Problem List   Diagnosis Date Noted  . 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 Bilateral tinnitus 11/14/2017  . Tachycardia, paroxysmal ([place]) 07/25/2016  . GAD (generalized anxiety disorder) 11/11/2014  . Insomnia, persistent 11/11/2014  . Dyspareunia 11/11/2014  . Generalized headache 11/11/2014  . Bursitis, trochanteric 11/11/2014  . History of Rocky Mountain spotted fever 04/11/2014  . Dyslipidemia 10/29/2006  . Benign hypertension 10/29/2006  . Allergic rhinitis 10/29/2006    Past Surgical History:  Procedure Laterality Date  . COLONOSCOPY    . TUBAL LIGATION  1987    Family History  Problem Relation Age of Onset  . Heart disease Mother   . Diabetes Mother   . Alcohol abuse Father   . Heart disease Father   . Heart disease Sister   . Hypertension Brother   . Hypertension Brother   . Hypertension Brother   . Hypertension Brother   . Breast cancer Paternal Aunt 33  . Breast cancer Cousin     Social History   Socioeconomic History  . Marital status: Married    Spouse name: Richardson Landry  . Number of children: 2  . Years of education: Not on file  . Highest education level: Associate degree: occupational, Hotel manager, or vocational program  Occupational History  . Occupation: retired    Comment: Control and instrumentation engineer  . Occupation: Oceanographer  Social Needs  . Financial resource strain: Not very hard  . Food insecurity:    Worry: Never true    Inability: Never true  . Transportation needs:    Medical: No    Non-medical: No  Tobacco Use  . Smoking status: Never Smoker  . Smokeless tobacco: Never Used  Substance and Sexual Activity  .  Alcohol use: No    Alcohol/week: 0.0 standard drinks  . Drug use: No  . Sexual activity: Yes    Partners: Female  Lifestyle  . Physical activity:    Days per week: 0 days    Minutes per session: 0 min  . Stress: Only a little  Relationships  . Social connections:    Talks on phone: More than three times a week    Gets together: More than three times a week    Attends religious service: More  than 4 times per year    Active member of club or organization: Yes    Attends meetings of clubs or organizations: More than 4 times per year    Relationship status: Married  . Intimate partner violence:    Fear of current or ex partner: No    Emotionally abused: No    Physically abused: No    Forced sexual activity: No  Other Topics Concern  . 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 She watches them after school about twice a week   Husband is a Ship broker, works on cars, but does not seem to care about their house     Current Outpatient Medications:  .  acetaminophen (TYLENOL) 650 MG CR tablet, Take 1 tablet (650 mg total) by mouth as needed for pain., Disp: 30 tablet, Rfl: 0 .  Coenzyme Q10 (CO Q 10 PO), Take 300 mg by mouth daily., Disp: , Rfl:  .  fluticasone (FLONASE) 50 MCG/ACT nasal spray, Place 2 sprays into both nostrils daily., Disp: 16 g, Rfl: 6 .  ibuprofen (ADVIL,MOTRIN) 200 MG tablet, Take 200 mg by mouth every 6 (six) hours as needed. Reported on 11/18/2015, Disp: , Rfl:  .  loratadine (CLARITIN) 10 MG tablet, Take 1 tablet (10 mg total) by mouth daily., Disp: 30 tablet, Rfl: 0 .  Multiple Vitamins-Minerals (AIRBORNE PO), Take 1 tablet by mouth as needed. , Disp: , Rfl:  .  rosuvastatin (CRESTOR) 5 MG tablet, Take 1 tablet (5 mg total) by mouth 3 (three) times a week., Disp:  36 tablet, Rfl: 1 .  telmisartan-hydrochlorothiazide (MICARDIS HCT) 40-12.5 MG tablet, Take 1 tablet by mouth daily., Disp: 30 tablet, Rfl: 5 .  hydrALAZINE (APRESOLINE) 10 MG tablet, Take 1 tablet (10 mg total) by mouth 3 (three) times daily. Prn bp above 150/90, Disp: 30 tablet, Rfl: 0  No Known Allergies   ROS  Constitutional: Negative for fever or weight change.  Respiratory: Negative for cough and shortness of breath.   Cardiovascular: Negative for chest pain or palpitations.  Gastrointestinal: Negative for abdominal pain, no bowel changes.  Musculoskeletal: Negative for gait problem or joint swelling.  Skin: Negative for rash.  Neurological: Negative for dizziness or headache.  No other specific complaints in a complete review of systems (except as listed in HPI above).  Objective   Vitals:   06/06/18 0919  BP: (!) 160/100  Pulse: 83  Resp: 16  Temp: 97.9 F (36.6 C)  TempSrc: Oral  SpO2: 96%  Weight: 143 lb 14.4 oz (65.3 kg)  Height: 5' 2" (1.575 m)    Body mass index is 26.32 kg/m.  Physical Exam  Constitutional: Patient appears well-developed and overweight.. No distress.  HENT: Head: Normocephalic and atraumatic. Ears: B TMs ok, no erythema or effusion; Nose: Nose normal. Mouth/Throat: Oropharynx is clear and moist. No oropharyngeal exudate.  Eyes: Conjunctivae and EOM are normal. Pupils are equal, round, and reactive to light. No scleral icterus.  Neck: Normal range of motion. Neck supple. No JVD present. No thyromegaly present.  Cardiovascular: Normal rate, regular rhythm and normal heart sounds.  No murmur heard. No BLE edema. Pulmonary/Chest: Effort normal and breath sounds normal. No respiratory distress. Abdominal: Soft. Bowel sounds are normal, no distension. There is  no tenderness. no masses Breast: no lumps or masses, no nipple discharge or rashes FEMALE GENITALIA:  External genitalia normal External urethra normal Vaginal vault normal without  discharge or lesions Cervix normal without discharge or lesions Bimanual exam normal without masses RECTAL: not done  Musculoskeletal: Normal range of motion, no joint effusions. No gross deformities Neurological: he is alert and oriented to person, place, and time. No cranial nerve deficit. Coordination, balance, strength, speech and gait are normal.  Skin: Skin is warm and dry. No rash noted. No erythema.  Psychiatric: Patient has a normal mood and affect. behavior is normal. Judgment and thought content normal.  PHQ2/9: Depression screen [facility] 2/9 06/06/2018 11/14/2017 02/20/2017 11/20/2016 08/21/2016  Decreased Interest 1 1 0 0 0  Down, Depressed, Hopeless 1 1 0 0 1  PHQ - 2 Score 2 2 0 0 1  Altered sleeping 0 1 - - -  Tired, decreased energy 1 1 - - -  Change in appetite 0 1 - - -  Feeling bad or failure about yourself  1 1 - - -  Trouble concentrating 0 0 - - -  Moving slowly or fidgety/restless 0 0 - - -  Suicidal thoughts 0 0 - - -  PHQ-9 Score 4 6 - - -  Difficult doing work/chores Not difficult at all Somewhat difficult - - -     Fall Risk: Fall Risk  06/06/2018 11/14/2017 02/20/2017 11/20/2016 08/21/2016  Falls in the past year? 0 No No No No  Number falls in past yr: 0 - - - -  Injury with Fall? 0 - - - -    Functional Status Survey: Is the patient deaf or have difficulty hearing?: Yes Does the patient have difficulty seeing, even when wearing glasses/contacts?: No Does the patient have difficulty concentrating, remembering, or making decisions?: No Does the patient have difficulty walking or climbing stairs?: No Does the patient have difficulty dressing or bathing?: No Does the patient have difficulty doing errands alone such as visiting a doctor's office or shopping?: No   Assessment & Plan  1. Well woman exam   2. Breast cancer screening  - MM 3D SCREEN BREAST BILATERAL; Future  3. Cervical cancer screening  - Cytology - PAP  4. Postmenopausal  - DG Bone  Density; Future  5. Tachycardia, paroxysmal (HCC)  Doing well at this time, seen by cardiologist   6. Plantar fasciitis, right  Discussed stretching, to get OOFOS shoes and ice it  7. Benign hypertension  To take prn only , bp at home has been at goal but elevated today, we will start prn medication  - hydrALAZINE (APRESOLINE) 10 MG tablet; Take 1 tablet (10 mg total) by mouth 3 (three) times daily.  Dispense: 30 tablet; Refill: 0   -USPSTF grade A and B recommendations reviewed with patient; age-appropriate recommendations, preventive care, screening tests, etc discussed and encouraged; healthy living encouraged; see AVS for patient education given to patient -Discussed importance of 150 minutes of physical activity weekly, eat two servings of fish weekly, eat one serving of tree nuts ( cashews, pistachios, pecans, almonds.Marland Kitchen) every other day, eat 6 servings of fruit/vegetables daily and drink plenty of water and avoid sweet beverages.

## 2018-06-11 LAB — CYTOLOGY - PAP
Diagnosis: NEGATIVE
HPV: NOT DETECTED

## 2018-08-26 ENCOUNTER — Ambulatory Visit (INDEPENDENT_AMBULATORY_CARE_PROVIDER_SITE_OTHER): Payer: BC Managed Care – PPO | Admitting: Family Medicine

## 2018-08-26 ENCOUNTER — Other Ambulatory Visit: Payer: Self-pay

## 2018-08-26 ENCOUNTER — Encounter: Payer: Self-pay | Admitting: Family Medicine

## 2018-08-26 VITALS — BP 123/82 | Temp 97.3°F | Wt 144.0 lb

## 2018-08-26 DIAGNOSIS — R7303 Prediabetes: Secondary | ICD-10-CM | POA: Diagnosis not present

## 2018-08-26 DIAGNOSIS — I10 Essential (primary) hypertension: Secondary | ICD-10-CM | POA: Diagnosis not present

## 2018-08-26 DIAGNOSIS — E785 Hyperlipidemia, unspecified: Secondary | ICD-10-CM | POA: Diagnosis not present

## 2018-08-26 MED ORDER — ROSUVASTATIN CALCIUM 5 MG PO TABS: 5.0000 mg | ORAL_TABLET | ORAL | 1 refills | Status: DC

## 2018-08-26 MED ORDER — TELMISARTAN-HCTZ 40-12.5 MG PO TABS: 1.0000 | ORAL_TABLET | Freq: Every day | ORAL | 3 refills | Status: DC

## 2018-08-26 NOTE — Progress Notes (Signed)
Name: Jodi KaufmannJoan W Scalia   MRN: [id number]    DOB: 1955/08/01   Date:08/26/2018       Progress Note  Subjective  Chief Complaint  Chief Complaint  Patient presents with  . Hypertension    I connected with  Jodi Fernandez  on 08/26/18 at 10:40 AM EDT by a video enabled telemedicine application and verified that I am speaking with the correct person using two identifiers.  I discussed the limitations of evaluation and management by telemedicine and the availability of in person appointments. The patient expressed understanding and agreed to proceed. Staff also discussed with the patient that there may be a patient responsible charge related to this service. Patient Location: home  Provider Location: [facility]    HPI  HTN: bp at home has been 110-120's/70's-80's. She denies chest pain or palpitation lately. She has a history of paroxysmal tachycardia, but could not tolerate medication to control rate, she is down to one cup of coffee in the morning, also only drinking sodas half a glass at most twice a week. BP at home has been well controlled and did not have to take prn medication at all since last visit    Pre-diabetes: last hgbA1C was 5.8% and fasting glucose also above 100. She denies polyphagia, polydipsia or polyuria. We will recheck labs yearly.   Hyperlipidemia: on statin therapy, denies myalgias. She takes medication a few times a week  Patient Active Problem List   Diagnosis Date Noted  . 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 Bilateral tinnitus 11/14/2017  . Tachycardia, paroxysmal (HCC) 07/25/2016  . GAD (generalized anxiety disorder) 11/11/2014  . Insomnia, persistent 11/11/2014  . Dyspareunia 11/11/2014  . Generalized headache 11/11/2014  . Bursitis, trochanteric 11/11/2014  . History of Rocky Mountain spotted fever 04/11/2014  . Dyslipidemia 10/29/2006  . Benign hypertension 10/29/2006  . Allergic  rhinitis 10/29/2006    Past Surgical History:  Procedure Laterality Date  . COLONOSCOPY    . TUBAL LIGATION  1987    Family History  Problem Relation Age of Onset  . Heart disease Mother   . Diabetes Mother   . Alcohol abuse Father   . Heart disease Father   . Heart disease Sister   . Hypertension Brother   . Hypertension Brother   . Hypertension Brother   . Hypertension Brother   . Breast cancer Paternal Aunt [id number]  . Breast cancer Cousin     Social History   Socioeconomic History  . Marital status: Married    Spouse name: Brett CanalesSteve  . Number of children: 2  . Years of education: Not on file  . Highest education level: Associate degree: occupational, Scientist, product/process developmenttechnical, or vocational program  Occupational History  . Occupation: retired    Comment: Geologist, engineeringteacher assistant  . Occupation: Lawyersubstitute teacher  Social Needs  . Financial resource strain: Not very hard  . Food insecurity:    Worry: Never true    Inability: Never true  . Transportation needs:    Medical: No    Non-medical: No  Tobacco Use  . Smoking status: Never Smoker  . Smokeless tobacco: Never Used  Substance and Sexual Activity  . Alcohol use: No    Alcohol/week: 0.0 standard drinks  . Drug use: No  . Sexual activity: Yes    Partners: Female  Lifestyle  . Physical activity:    Days per week: 0 days    Minutes per session: 0  min  . Stress: Only a little  Relationships  . Social connections:    Talks on phone: More than three times a week    Gets together: More than three times a week    Attends religious service: More than 4 times per year    Active member of club or organization: Yes    Attends meetings of clubs or organizations: More than 4 times per year    Relationship status: Married  . Intimate partner violence:    Fear of current or ex partner: No    Emotionally abused: No    Physically abused: No    Forced sexual activity: No  Other Topics Concern  . 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 She watches them after school about twice a week   Husband is a Chartered loss adjuster, works on cars, but does not seem to care about their house     Current Outpatient Medications:  .  acetaminophen (TYLENOL) 650 MG CR tablet, Take 1 tablet (650 mg total) by mouth as needed for pain., Disp: 30 tablet, Rfl: 0 .  Coenzyme Q10 (CO Q 10 PO), Take 300 mg by mouth daily., Disp: , Rfl:  .  hydrALAZINE (APRESOLINE) 10 MG tablet, Take 1 tablet (10 mg total) by mouth 3 (three) times daily. Prn bp above 150/90, Disp: 30 tablet, Rfl: 0 .  ibuprofen (ADVIL,MOTRIN) 200 MG tablet, Take 200 mg by mouth every 6 (six) hours as needed. Reported on 11/18/2015, Disp: , Rfl:  .  Multiple Vitamins-Minerals (AIRBORNE PO), Take 1 tablet by mouth as needed. , Disp: , Rfl:  .  rosuvastatin (CRESTOR) 5 MG tablet, Take 1 tablet (5 mg total) by mouth 3 (three) times a week., Disp: 36 tablet, Rfl: 1 .  telmisartan-hydrochlorothiazide (MICARDIS HCT) 40-12.5 MG tablet, Take 1 tablet by mouth daily., Disp: 30 tablet, Rfl: 5 .  fluticasone (FLONASE) 50 MCG/ACT nasal spray, Place 2 sprays into both nostrils daily. (Patient not taking: Reported on 08/26/2018), Disp: 16 g, Rfl: 6 .  loratadine (CLARITIN) 10 MG tablet, Take 1 tablet (10 mg total) by mouth daily. (Patient not taking: Reported on 08/26/2018), Disp: 30 tablet, Rfl: 0  No Known Allergies  I personally reviewed active problem list, medication list, allergies, family history with the patient/caregiver today.   ROS  Constitutional: Negative for fever or weight change.  Respiratory: Negative for cough and shortness of breath.   Cardiovascular: Negative for chest pain or palpitations.  Gastrointestinal: Negative for abdominal pain, no bowel changes.  Musculoskeletal: Negative for  gait problem or joint swelling.  Skin: Negative for rash.  Neurological: Negative for dizziness or headache.  No other specific complaints in a complete review of systems (except as listed in HPI above).  Objective  Virtual encounter, vitals not obtained.  There is no height or weight on file to calculate BMI.  Physical Exam  Awake, alert, oriented and in no distress  PHQ2/9: Depression screen [facility] 2/9 08/26/2018 06/06/2018 11/14/2017 02/20/2017 11/20/2016  Decreased Interest 0 1 1 0 0  Down, Depressed, Hopeless 0 1 1 0 0  PHQ - 2 Score 0 2 2 0 0  Altered sleeping 0 0 1 - -  Tired, decreased energy 0 1 1 - -  Change in  appetite 0 0 1 - -  Feeling bad or failure about yourself  0 1 1 - -  Trouble concentrating 0 0 0 - -  Moving slowly or fidgety/restless 0 0 0 - -  Suicidal thoughts 0 0 0 - -  PHQ-9 Score 0 4 6 - -  Difficult doing work/chores - Not difficult at all Somewhat difficult - -   PHQ-2/9 Result is negative.    Fall Risk: Fall Risk  08/26/2018 06/06/2018 11/14/2017 02/20/2017 11/20/2016  Falls in the past year? 0 0 No No No  Number falls in past yr: 0 0 - - -  Injury with Fall? 0 0 - - -     Assessment & Plan  1. Benign hypertension  - telmisartan-hydrochlorothiazide (MICARDIS HCT) 40-12.5 MG tablet; Take 1 tablet by mouth daily.  Dispense: 30 tablet; Refill: 5  2. Dyslipidemia  - rosuvastatin (CRESTOR) 5 MG tablet; Take 1 tablet (5 mg total) by mouth 3 (three) times a week.  Dispense: 36 tablet; Refill: 1  3. Pre-diabetes  Discussed life style modification again  I discussed the assessment and treatment plan with the patient. The patient was provided an opportunity to ask questions and all were answered. The patient agreed with the plan and demonstrated an understanding of the instructions.  The patient was advised to call back or seek an in-person evaluation if the symptoms worsen or if the condition fails to improve as anticipated.  I provided 18  minutes  of non-face-to-face time during this encounter.

## 2018-12-24 ENCOUNTER — Encounter: Payer: Self-pay | Admitting: Family Medicine

## 2018-12-24 ENCOUNTER — Ambulatory Visit (INDEPENDENT_AMBULATORY_CARE_PROVIDER_SITE_OTHER): Payer: BC Managed Care – PPO | Admitting: Family Medicine

## 2018-12-24 ENCOUNTER — Other Ambulatory Visit: Payer: Self-pay

## 2018-12-24 VITALS — BP 170/100 | HR 91 | Temp 97.5°F | Resp 16 | Ht 61.0 in | Wt 147.6 lb

## 2018-12-24 DIAGNOSIS — Z0000 Encounter for general adult medical examination without abnormal findings: Secondary | ICD-10-CM

## 2018-12-24 DIAGNOSIS — Z23 Encounter for immunization: Secondary | ICD-10-CM | POA: Diagnosis not present

## 2018-12-24 DIAGNOSIS — R69 Illness, unspecified: Secondary | ICD-10-CM | POA: Diagnosis not present

## 2018-12-24 DIAGNOSIS — I10 Essential (primary) hypertension: Secondary | ICD-10-CM

## 2018-12-24 DIAGNOSIS — R7303 Prediabetes: Secondary | ICD-10-CM | POA: Diagnosis not present

## 2018-12-24 DIAGNOSIS — E785 Hyperlipidemia, unspecified: Secondary | ICD-10-CM | POA: Diagnosis not present

## 2018-12-24 DIAGNOSIS — R739 Hyperglycemia, unspecified: Secondary | ICD-10-CM | POA: Diagnosis not present

## 2018-12-24 DIAGNOSIS — F411 Generalized anxiety disorder: Secondary | ICD-10-CM

## 2018-12-24 NOTE — Progress Notes (Addendum)
Patient: Jodi Fernandez, Female    DOB: 1955-07-01, 62 y.o.   MRN: [id number]  Visit Date: 12/24/2018  Today's Provider: Loistine Chance, MD   Chief Complaint  Patient presents with  . Medicare Wellness    Subjective:    HPI   Jodi Fernandez is a 63 y.o. female who presents today for her Subsequent Annual Wellness Visit and follow up  Patient/Caregiver input:  none  HTN: bp at home has been 117-120's/70-80's  She denies chest pain or palpitation lately. She has a history of paroxysmal tachycardia, but could not tolerate medication to control rate, she is down to one cup of coffee in the morning, also only drinking sodas half a glass at most twice a week.    Dyslipidemia: she is due for labs, on statin therapy a few days a week and no myalgias  Pre-diabetes: she states drinks water and has increase in urinary frequency but takes a diuretic, last A1C was 5.8%and due for repeat labs. No polyphagia  Review of Systems  Constitutional: Negative for fever or weight change.  Respiratory: Negative for cough and shortness of breath.   Cardiovascular: Negative for chest pain or palpitations.  Gastrointestinal: Negative for abdominal pain, no bowel changes.  Musculoskeletal: Negative for gait problem or joint swelling.  Skin: Negative for rash.  Neurological: Negative for dizziness or headache.  No other specific complaints in a complete review of systems (except as listed in HPI above).  Past Medical History:  Diagnosis Date  . Abnormal glucose   . Allergy   . Generalized headache   . Hemorrhoids   . Hyperlipidemia   . Hypertension   . Insomnia   . [facility] spotted fever   . Ventricular tachycardia ([place]) 08/11/2016   received documention from [facility] (Dr. Mamie Nick)    Past Surgical History:  Procedure Laterality Date  . COLONOSCOPY    . TUBAL LIGATION  1987    Family History  Problem Relation Age of Onset  . Heart disease Mother   . Diabetes Mother   . Alcohol  abuse Father   . Heart disease Father   . Heart disease Sister   . Hypertension Brother   . Hypertension Brother   . Hypertension Brother   . Hypertension Brother   . Breast cancer Paternal Aunt 63  . Breast cancer Cousin     Social History   Socioeconomic History  . Marital status: Married    Spouse name: Richardson Landry  . Number of children: 2  . Years of education: Not on file  . Highest education level: Associate degree: occupational, Hotel manager, or vocational program  Occupational History  . Occupation: retired    Comment: Control and instrumentation engineer  . Occupation: Oceanographer  Social Needs  . Financial resource strain: Not very hard  . Food insecurity    Worry: Never true    Inability: Never true  . Transportation needs    Medical: No    Non-medical: No  Tobacco Use  . Smoking status: Never Smoker  . Smokeless tobacco: Never Used  Substance and Sexual Activity  . Alcohol use: No    Alcohol/week: 0.0 standard drinks  . Drug use: No  . Sexual activity: Yes    Partners: Female  Lifestyle  . Physical activity    Days per week: 1 day    Minutes per session: 20 min  . Stress: Only a little  Relationships  . Social connections    Talks on phone: More than three  times a week    Gets together: More than three times a week    Attends religious service: More than 4 times per year    Active member of club or organization: Yes    Attends meetings of clubs or organizations: More than 4 times per year    Relationship status: Married  . Intimate partner violence    Fear of current or ex partner: No    Emotionally abused: No    Physically abused: No    Forced sexual activity: No  Other Topics Concern  . 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 She watches them after school  about twice a week   Husband is a Chartered loss adjuster, works on cars, but does not seem to care about their house    Outpatient Encounter Medications as of 12/24/2018  Medication Sig Note  . acetaminophen (TYLENOL) 650 MG CR tablet Take 1 tablet (650 mg total) by mouth as needed for pain.   . Coenzyme Q10 (CO Q 10 PO) Take 300 mg by mouth daily.   . fluticasone (FLONASE) 50 MCG/ACT nasal spray Place 2 sprays into both nostrils daily.   . hydrALAZINE (APRESOLINE) 10 MG tablet Take 1 tablet (10 mg total) by mouth 3 (three) times daily. Prn bp above 150/90   . ibuprofen (ADVIL,MOTRIN) 200 MG tablet Take 200 mg by mouth every 6 (six) hours as needed. Reported on 11/18/2015 11/18/2015: PRN  . Multiple Vitamins-Minerals (AIRBORNE PO) Take 1 tablet by mouth as needed.    . rosuvastatin (CRESTOR) 5 MG tablet Take 1 tablet (5 mg total) by mouth 3 (three) times a week.   . telmisartan-hydrochlorothiazide (MICARDIS HCT) 40-12.5 MG tablet Take 1 tablet by mouth daily.   Marland Kitchen loratadine (CLARITIN) 10 MG tablet Take 1 tablet (10 mg total) by mouth daily. (Patient not taking: Reported on 12/24/2018)    No facility-administered encounter medications on file as of 12/24/2018.     No Known Allergies  Care Team Updated in EHR: Yes  Last Vision Exam: it has been a long time  Wears corrective lenses: No Last Dental Exam: about 5 years ago - Dr. Axel Filler in [place]  Last Hearing Exam:  Wears Hearing Aids: Yes  Functional Ability / Safety Screening 1.  Was the timed Get Up and Go test shorter than 30 seconds?  yes 2.  Does the patient need help with the phone, transportation, shopping,      preparing meals, housework, laundry, medications, or managing money?  yes 3.  Is the patient's home free of loose throw rugs in walkways, pet beds, electrical cords, etc?   yes      Grab bars in the bathroom? no      Handrails on the stairs?   yes      Adequate lighting?   yes 4.  Has the patient noticed any hearing difficulties?   yes  already evaluated   Diet Recall and Exercise Regimen: needs to increase physical activity, only walking once a week for 20 minutes, she eats fruit and vegetables and mostly balanced with occasional splurges   Advanced Care Planning: A voluntary discussion about advance care planning including the explanation and discussion of advance directives.  Discussed health care proxy and Living will, and the patient  was able to identify a health care proxy as daughter Leotis ShamesLauren.  Patient does not have a living will at present time. If patient does have living will, I have requested they bring this to the clinic to be scanned in to their chart. Does patient have a HCPOA?    no If yes, name and contact information: N/A Does patient have a living will or MOST form?  no  Cancer Screenings: Skin: discussed atypical lesions  Lung:  Low Dose CT Chest recommended if Age 10-80 years, 30 pack-year currently smoking OR have quit w/in 15years. Patient does not qualify. Breast:  Up to date on Mammogram? Yes  Up to date of Bone Density/Dexa? No scheduled for 01/21/2019 Colon: up to date   Additional Screenings: Hepatitis B/HIV/Syphillis:N/A Hepatitis C Screening: negative screen  Intimate Partner Violence:  Negative screen   Objective:   Vitals: BP (!) 160/110 (BP Location: Left Arm, Patient Position: Sitting, Cuff Size: Normal)   Pulse 91   Temp (!) 97.5 F (36.4 C) (Temporal)   Resp 16   Ht 5\' 1"  (1.549 m)   Wt 147 lb 9.6 oz (67 kg)   SpO2 98%   BMI 27.89 kg/m  Body mass index is 27.89 kg/m.   Hearing Screening   Method: Audiometry   125Hz  250Hz  500Hz  1000Hz  2000Hz  3000Hz  4000Hz  6000Hz  8000Hz   Right ear:   Pass Pass Pass  Pass    Left ear:   Pass Pass Pass  Fail      Visual Acuity Screening   Right eye Left eye Both eyes  Without correction: 20 15 20  50 20 13  With correction:      She has seen [facility] for tinnitus and had mild hearing loss   Physical Exam   Constitutional: Patient  appears well-developed and well-nourished. No distress.  HEENT: head atraumatic, normocephalic, pupils equal and reactive to light,  neck supple Cardiovascular: Normal rate, regular rhythm and normal heart sounds.  No murmur heard. No BLE edema. Pulmonary/Chest: Effort normal and breath sounds normal. No respiratory distress. Abdominal: Soft.  There is no tenderness. Psychiatric: Patient has a normal mood and affect. behavior is normal. Judgment and thought content normal.  Cognitive Testing - 6-CIT  Correct? Score   What year is it? yes 0 Yes = 0    No = 4  What month is it? yes 0 Yes = 0    No = 3  Remember:     Floyde ParkinsJohn Smith, [address]  [place], [place]     What time is it? yes 0 Yes = 0    No = 3  Count backwards from 20 to 1 yes 0 Correct = 0    1 error = 2   More than 1 error = 4  Say the months of the year in reverse. yes 0 Correct = 0    1 error = 2   More than 1 error = 4  What address did I ask you to remember? yes 0 Correct = 0  1 error = 2    2 error = 4    3 error = 6    4 error = 8    All wrong = 10       TOTAL SCORE  0/28   Interpretation:  Normal  Normal (0-7) Abnormal (8-28)    Hearing Screening   Method: Audiometry   125Hz  250Hz  500Hz  1000Hz  2000Hz  3000Hz  4000Hz  6000Hz  8000Hz   Right ear:   Pass Pass  Pass  Pass    Left ear:   Pass Pass Pass  Fail      Visual Acuity Screening   Right eye Left eye Both eyes  Without correction: 20 15 20  50 20 13  With correction:       Fall Risk: Fall Risk  08/26/2018 06/06/2018 11/14/2017 02/20/2017 11/20/2016  Falls in the past year? 0 0 No No No  Number falls in past yr: 0 0 - - -  Injury with Fall? 0 0 - - -    Depression Screen Depression screen [facility] 12/24/2018 08/26/2018 06/06/2018 11/14/2017 02/20/2017  Decreased Interest 0 0 1 1 0  Down, Depressed, Hopeless 0 0 1 1 0  PHQ - 2 Score 0 0 2 2 0  Altered sleeping 0 0 0 1 -  Tired, decreased energy 0 0 1 1 -  Change in appetite 0 0 0 1 -  Feeling bad or failure about yourself   0 0 1 1 -  Trouble concentrating 0 0 0 0 -  Moving slowly or fidgety/restless 0 0 0 0 -  Suicidal thoughts 0 0 0 0 -  PHQ-9 Score 0 0 4 6 -  Difficult doing work/chores - - Not difficult at all Somewhat difficult -    No results found for this or any previous visit (from the past 2160 hour(s)).  Assessment & Plan:    1. Welcome to Medicare preventive visit  She will exercise more    2. Need for shingles vaccine  - Varicella-zoster vaccine IM (Shingrix)  3. Need for vaccination with 13-polyvalent pneumococcal conjugate vaccine  - Pneumococcal conjugate vaccine 13-valent IM  4. Benign hypertension  - COMPLETE METABOLIC PANEL WITH GFR - CBC with Differential/Platelet - Microalbumin / creatinine urine ratio  BP elevated she will return for bp check   5. Dyslipidemia  - Lipid panel  6. Pre-diabetes   7. GAD (generalized anxiety disorder)   8. Hyperglycemia  - Hemoglobin A1c  - Discussed health benefits of physical activity, and encouraged her to engage in regular exercise appropriate for her age and condition.   Immunization History  Administered Date(s) Administered  . Influenza,inj,Quad PF,6+ Mos 02/20/2013, 03/03/2014, 02/15/2015, 02/20/2017, 02/19/2018  . Influenza-Unspecified 03/03/2014  . Pneumococcal Conjugate-13 12/24/2018  . Tdap 05/08/2010, 06/06/2018  . Zoster 02/15/2015  . Zoster Recombinat (Shingrix) 06/06/2018, 12/24/2018    Health Maintenance  Topic Date Due  . MAMMOGRAM  07/07/2018  . DEXA SCAN  12/20/2018  . INFLUENZA VACCINE  12/07/2018  . PNA vac Low Risk Adult (2 of 2 - PPSV23) 12/24/2019  . PAP SMEAR-Modifier  06/06/2021  . COLONOSCOPY  08/12/2026  . TETANUS/TDAP  06/06/2028  . Hepatitis C Screening  Completed  . HIV Screening  Completed    No orders of the defined types were placed in this encounter.   Current Outpatient Medications:  .  acetaminophen (TYLENOL) 650 MG CR tablet, Take 1 tablet (650 mg total) by mouth as  needed for pain., Disp: 30 tablet, Rfl: 0 .  Coenzyme Q10 (CO Q 10 PO), Take 300 mg by mouth daily., Disp: , Rfl:  .  fluticasone (FLONASE) 50 MCG/ACT nasal spray, Place 2 sprays into both nostrils daily., Disp: 16 g, Rfl: 6 .  hydrALAZINE (APRESOLINE) 10 MG tablet, Take 1 tablet (10 mg total) by mouth 3 (three) times daily. Prn bp above 150/90, Disp: 30 tablet, Rfl: 0 .  ibuprofen (ADVIL,MOTRIN) 200 MG tablet, Take 200 mg by mouth every 6 (six)  hours as needed. Reported on 11/18/2015, Disp: , Rfl:  .  Multiple Vitamins-Minerals (AIRBORNE PO), Take 1 tablet by mouth as needed. , Disp: , Rfl:  .  rosuvastatin (CRESTOR) 5 MG tablet, Take 1 tablet (5 mg total) by mouth 3 (three) times a week., Disp: 36 tablet, Rfl: 1 .  telmisartan-hydrochlorothiazide (MICARDIS HCT) 40-12.5 MG tablet, Take 1 tablet by mouth daily., Disp: 30 tablet, Rfl: 3 .  loratadine (CLARITIN) 10 MG tablet, Take 1 tablet (10 mg total) by mouth daily. (Patient not taking: Reported on 12/24/2018), Disp: 30 tablet, Rfl: 0 There are no discontinued medications.  I have personally reviewed and addressed the Medicare Annual Wellness health risk assessment questionnaire and have noted the following in the patient's chart:  A.         Medical and social history & family history B.         Use of alcohol, tobacco, and illicit drugs  C.         Current medications and supplements D.         Functional and Cognitive ability and status E.         Nutritional status F.         Physical activity G.        Advance directives H.         List of other physicians I.          Hospitalizations, surgeries, and ER visits in previous 12 months J.         Vitals K.         Screenings such as hearing, vision, cognitive function, and depression L.         Referrals and appointments:reviewed with patient, needs eye and dental exam, keep mammogram and bone density appointment   In addition, I have reviewed and discussed with patient certain preventive  protocols, quality metrics, and best practice recommendations. A written personalized care plan for preventive services as well as general preventive health recommendations were provided to patient.   See attached scanned questionnaire for additional information.   Return in about 6 weeks (around 02/04/2019) for Follow up for bp up with her monitor to calibrate and flu shot .

## 2018-12-24 NOTE — Patient Instructions (Signed)
Preventive Care 38 Years and Older, Female Preventive care refers to lifestyle choices and visits with your health care provider that can promote health and wellness. This includes:  A yearly physical exam. This is also called an annual well check.  Regular dental and eye exams.  Immunizations.  Screening for certain conditions.  Healthy lifestyle choices, such as diet and exercise. What can I expect for my preventive care visit? Physical exam Your health care provider will check:  Height and weight. These may be used to calculate body mass index (BMI), which is a measurement that tells if you are at a healthy weight.  Heart rate and blood pressure.  Your skin for abnormal spots. Counseling Your health care provider may ask you questions about:  Alcohol, tobacco, and drug use.  Emotional well-being.  Home and relationship well-being.  Sexual activity.  Eating habits.  History of falls.  Memory and ability to understand (cognition).  Work and work Statistician.  Pregnancy and menstrual history. What immunizations do I need?  Influenza (flu) vaccine  This is recommended every year. Tetanus, diphtheria, and pertussis (Tdap) vaccine  You may need a Td booster every 10 years. Varicella (chickenpox) vaccine  You may need this vaccine if you have not already been vaccinated. Zoster (shingles) vaccine  You may need this after age 33. Pneumococcal conjugate (PCV13) vaccine  One dose is recommended after age 33. Pneumococcal polysaccharide (PPSV23) vaccine  One dose is recommended after age 72. Measles, mumps, and rubella (MMR) vaccine  You may need at least one dose of MMR if you were born in 1957 or later. You may also need a second dose. Meningococcal conjugate (MenACWY) vaccine  You may need this if you have certain conditions. Hepatitis A vaccine  You may need this if you have certain conditions or if you travel or work in places where you may be exposed  to hepatitis A. Hepatitis B vaccine  You may need this if you have certain conditions or if you travel or work in places where you may be exposed to hepatitis B. Haemophilus influenzae type b (Hib) vaccine  You may need this if you have certain conditions. You may receive vaccines as individual doses or as more than one vaccine together in one shot (combination vaccines). Talk with your health care provider about the risks and benefits of combination vaccines. What tests do I need? Blood tests  Lipid and cholesterol levels. These may be checked every 5 years, or more frequently depending on your overall health.  Hepatitis C test.  Hepatitis B test. Screening  Lung cancer screening. You may have this screening every year starting at age 39 if you have a 30-pack-year history of smoking and currently smoke or have quit within the past 15 years.  Colorectal cancer screening. All adults should have this screening starting at age 36 and continuing until age 15. Your health care provider may recommend screening at age 23 if you are at increased risk. You will have tests every 1-10 years, depending on your results and the type of screening test.  Diabetes screening. This is done by checking your blood sugar (glucose) after you have not eaten for a while (fasting). You may have this done every 1-3 years.  Mammogram. This may be done every 1-2 years. Talk with your health care provider about how often you should have regular mammograms.  BRCA-related cancer screening. This may be done if you have a family history of breast, ovarian, tubal, or peritoneal cancers.  Other tests  Sexually transmitted disease (STD) testing.  Bone density scan. This is done to screen for osteoporosis. You may have this done starting at age 55. Follow these instructions at home: Eating and drinking  Eat a diet that includes fresh fruits and vegetables, whole grains, lean protein, and low-fat dairy products. Limit  your intake of foods with high amounts of sugar, saturated fats, and salt.  Take vitamin and mineral supplements as recommended by your health care provider.  Do not drink alcohol if your health care provider tells you not to drink.  If you drink alcohol: ? Limit how much you have to 0-1 drink a day. ? Be aware of how much alcohol is in your drink. In the [place] one drink equals one 12 oz bottle of beer (355 mL), one 5 oz glass of wine (148 mL), or one 1 oz glass of hard liquor (44 mL). Lifestyle  Take daily care of your teeth and gums.  Stay active. Exercise for at least 30 minutes on 5 or more days each week.  Do not use any products that contain nicotine or tobacco, such as cigarettes, e-cigarettes, and chewing tobacco. If you need help quitting, ask your health care provider.  If you are sexually active, practice safe sex. Use a condom or other form of protection in order to prevent STIs (sexually transmitted infections).  Talk with your health care provider about taking a low-dose aspirin or statin. What's next?  Go to your health care provider once a year for a well check visit.  Ask your health care provider how often you should have your eyes and teeth checked.  Stay up to date on all vaccines. This information is not intended to replace advice given to you by your health care provider. Make sure you discuss any questions you have with your health care provider. Document Released: 05/21/2015 Document Revised: 04/18/2018 Document Reviewed: 04/18/2018 Elsevier Patient Education  2020 [employer].

## 2018-12-25 ENCOUNTER — Encounter: Payer: Self-pay | Admitting: Family Medicine

## 2018-12-25 LAB — LIPID PANEL
Cholesterol: 212 mg/dL — ABNORMAL HIGH (ref <200)
HDL: 45 mg/dL — ABNORMAL LOW (ref >=50)
LDL Cholesterol (Calc): 128 mg/dL (calc) — ABNORMAL HIGH
Non-HDL Cholesterol (Calc): 167 mg/dL (calc) — ABNORMAL HIGH (ref <130)
Total CHOL/HDL Ratio: 4.7 (calc) (ref <5.0)
Triglycerides: 240 mg/dL — ABNORMAL HIGH (ref <150)

## 2018-12-25 LAB — COMPLETE METABOLIC PANEL WITH GFR
AG Ratio: 1.8 (calc) (ref 1.0–2.5)
ALT: 9 U/L (ref 6–29)
AST: 18 U/L (ref 10–35)
Albumin: 4.6 g/dL (ref 3.6–5.1)
Alkaline phosphatase (APISO): 89 U/L (ref 37–153)
BUN: 11 mg/dL (ref 7–25)
CO2: 27 mmol/L (ref 20–32)
Calcium: 9.8 mg/dL (ref 8.6–10.4)
Chloride: 103 mmol/L (ref 98–110)
Creat: 0.74 mg/dL (ref 0.50–0.99)
GFR, Est African American: 99 mL/min/{1.73_m2} (ref >=60)
GFR, Est Non African American: 85 mL/min/{1.73_m2} (ref >=60)
Globulin: 2.5 g/dL (calc) (ref 1.9–3.7)
Glucose, Bld: 112 mg/dL — ABNORMAL HIGH (ref 65–99)
Potassium: 4.4 mmol/L (ref 3.5–5.3)
Sodium: 141 mmol/L (ref 135–146)
Total Bilirubin: 0.6 mg/dL (ref 0.2–1.2)
Total Protein: 7.1 g/dL (ref 6.1–8.1)

## 2018-12-25 LAB — CBC WITH DIFFERENTIAL/PLATELET
Absolute Monocytes: 570 cells/uL (ref 200–950)
Basophils Absolute: 30 cells/uL (ref 0–200)
Basophils Relative: 0.4 %
Eosinophils Absolute: 213 cells/uL (ref 15–500)
Eosinophils Relative: 2.8 %
HCT: 41.5 % (ref 35.0–45.0)
Hemoglobin: 14.3 g/dL (ref 11.7–15.5)
Lymphs Abs: 1740 cells/uL (ref 850–3900)
MCH: 32.2 pg (ref 27.0–33.0)
MCHC: 34.5 g/dL (ref 32.0–36.0)
MCV: 93.5 fL (ref 80.0–100.0)
MPV: 10 fL (ref 7.5–12.5)
Monocytes Relative: 7.5 %
Neutro Abs: 5046 cells/uL (ref 1500–7800)
Neutrophils Relative %: 66.4 %
Platelets: 347 10*3/uL (ref 140–400)
RBC: 4.44 10*6/uL (ref 3.80–5.10)
RDW: 12.2 % (ref 11.0–15.0)
Total Lymphocyte: 22.9 %
WBC: 7.6 10*3/uL (ref 3.8–10.8)

## 2018-12-25 LAB — HEMOGLOBIN A1C
Hgb A1c MFr Bld: 5.8 % of total Hgb — ABNORMAL HIGH (ref <5.7)
Mean Plasma Glucose: 120 (calc)
eAG (mmol/L): 6.6 (calc)

## 2018-12-25 LAB — MICROALBUMIN / CREATININE URINE RATIO
Creatinine, Urine: 31 mg/dL (ref 20–275)
Microalb, Ur: <0.2 mg/dL

## 2019-01-21 ENCOUNTER — Ambulatory Visit
Admission: RE | Admit: 2019-01-21 | Discharge: 2019-01-21 | Disposition: A | Discharge status: Home / Self Care | Payer: Medicare HMO | Source: Ambulatory Visit | Attending: Family Medicine | Admitting: Family Medicine

## 2019-01-21 DIAGNOSIS — Z780 Asymptomatic menopausal state: Secondary | ICD-10-CM | POA: Insufficient documentation

## 2019-01-21 DIAGNOSIS — M8580 Other specified disorders of bone density and structure, unspecified site: Secondary | ICD-10-CM | POA: Diagnosis not present

## 2019-01-21 DIAGNOSIS — Z1239 Encounter for other screening for malignant neoplasm of breast: Secondary | ICD-10-CM

## 2019-01-21 DIAGNOSIS — Z1231 Encounter for screening mammogram for malignant neoplasm of breast: Secondary | ICD-10-CM | POA: Diagnosis not present

## 2019-01-21 DIAGNOSIS — Z13820 Encounter for screening for osteoporosis: Secondary | ICD-10-CM | POA: Insufficient documentation

## 2019-01-21 DIAGNOSIS — M85852 Other specified disorders of bone density and structure, left thigh: Secondary | ICD-10-CM | POA: Diagnosis not present

## 2019-01-22 ENCOUNTER — Encounter: Payer: Self-pay | Admitting: Family Medicine

## 2019-01-22 DIAGNOSIS — M8580 Other specified disorders of bone density and structure, unspecified site: Secondary | ICD-10-CM | POA: Insufficient documentation

## 2019-01-22 DIAGNOSIS — Z780 Asymptomatic menopausal state: Secondary | ICD-10-CM | POA: Insufficient documentation

## 2019-02-05 ENCOUNTER — Encounter: Payer: Self-pay | Admitting: Family Medicine

## 2019-02-05 ENCOUNTER — Other Ambulatory Visit: Payer: Self-pay

## 2019-02-05 ENCOUNTER — Ambulatory Visit (INDEPENDENT_AMBULATORY_CARE_PROVIDER_SITE_OTHER): Payer: Medicare HMO | Admitting: Family Medicine

## 2019-02-05 VITALS — BP 142/92 | HR 92 | Temp 96.9°F | Resp 16 | Ht 61.0 in | Wt 147.4 lb

## 2019-02-05 DIAGNOSIS — I10 Essential (primary) hypertension: Secondary | ICD-10-CM | POA: Diagnosis not present

## 2019-02-05 DIAGNOSIS — F411 Generalized anxiety disorder: Secondary | ICD-10-CM

## 2019-02-05 DIAGNOSIS — Z23 Encounter for immunization: Secondary | ICD-10-CM | POA: Diagnosis not present

## 2019-02-05 DIAGNOSIS — R7303 Prediabetes: Secondary | ICD-10-CM | POA: Diagnosis not present

## 2019-02-05 DIAGNOSIS — I479 Paroxysmal tachycardia, unspecified: Secondary | ICD-10-CM | POA: Diagnosis not present

## 2019-02-05 DIAGNOSIS — R739 Hyperglycemia, unspecified: Secondary | ICD-10-CM

## 2019-02-05 DIAGNOSIS — E785 Hyperlipidemia, unspecified: Secondary | ICD-10-CM | POA: Diagnosis not present

## 2019-02-05 DIAGNOSIS — R69 Illness, unspecified: Secondary | ICD-10-CM | POA: Diagnosis not present

## 2019-02-05 MED ORDER — TELMISARTAN-HCTZ 40-12.5 MG PO TABS: 1.0000 | ORAL_TABLET | Freq: Every day | ORAL | 3 refills | Status: DC

## 2019-02-05 MED ORDER — ROSUVASTATIN CALCIUM 5 MG PO TABS: 5.0000 mg | ORAL_TABLET | ORAL | 1 refills | Status: DC

## 2019-02-05 NOTE — Progress Notes (Signed)
Name: Jodi Fernandez   MRN: [id number]    DOB: Apr 17, 1956   Date:02/05/2019       Progress Note  Subjective  Chief Complaint  Chief Complaint  Patient presents with  . Medication Refill  . Hypertension    Denies any symptoms  . Hyperlipidemia  . Pre-diabetes    HPI  HTN with white coat hypertension: : bp at home has been 115-120's/70's-80's  She denies chest pain or palpitation lately. She has a history of paroxysmal tachycardia, but could not tolerate medication to control rate, she is down to one cup of coffee in the morning, also only drinking sodas half a glass at most twice a week. She brought her bp machine today and SBP with her monitor was 10 points higher    Pre-diabetes: last hgbA1C was 5.8% and fasting glucose also above 100. She denies polyphagia, polydipsia or polyuria. Unchanged    Hyperlipidemia: on statin therapy, denies myalgias. She takes medication a few times a week., ASCVD is high but unable to take statins more often because it cause joint pains  The 10-year ASCVD risk score Mikey Bussing DC Jr., et al., 2013) is: 10.2%   Values used to calculate the score:     Age: 28 years     Sex: Female     Is Non-Hispanic African American: No     Diabetic: No     Tobacco smoker: No     Systolic Blood Pressure: 537 mmHg     Is BP treated: Yes     HDL Cholesterol: 45 mg/dL     Total Cholesterol: 212 mg/dL  GAD and dysthymia: there is a family history of depression ( mother and sister) , she states COVID-19 is worrying her, feeling sad because friends have been sick, she does not want medications. Discussed counseling but she wants to hold off . She states she is able to function - gets up, does her chores, not suicidal. She states gets very anxious when inside the care, also worries about her grandchildrens safety    Patient Active Problem List   Diagnosis Date Noted  . White coat syndrome with diagnosis of hypertension 02/05/2019  . Osteopenia after menopause 01/22/2019  .  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 Bilateral tinnitus 11/14/2017  . Tachycardia, paroxysmal ([place]) 07/25/2016  . GAD (generalized anxiety disorder) 11/11/2014  . Insomnia, persistent 11/11/2014  . Dyspareunia 11/11/2014  . Generalized headache 11/11/2014  . Bursitis, trochanteric 11/11/2014  . History of Rocky Mountain spotted fever 04/11/2014  . Dyslipidemia 10/29/2006  . Benign hypertension 10/29/2006  . Allergic rhinitis 10/29/2006    Past Surgical History:  Procedure Laterality Date  . COLONOSCOPY    . TUBAL LIGATION  1987    Family History  Problem Relation Age of Onset  . Heart disease Mother   . Diabetes Mother   . Alcohol abuse Father   . Heart disease Father   . Heart disease Sister   . Hypertension Brother   . Hypertension Brother   . Hypertension Brother   . Hypertension Brother   . Breast cancer Paternal Aunt 67  . Breast cancer Cousin     Social History   Socioeconomic History  . Marital status: Married    Spouse name: Richardson Landry  . Number of children: 2  . Years of education: Not on file  . Highest education level: Associate degree: occupational, Hotel manager, or vocational program  Occupational History  . Occupation: retired  Comment: Geologist, engineering  . Occupation: Lawyer  Social Needs  . Financial resource strain: Not very hard  . Food insecurity    Worry: Never true    Inability: Never true  . Transportation needs    Medical: No    Non-medical: No  Tobacco Use  . Smoking status: Never Smoker  . Smokeless tobacco: Never Used  Substance and Sexual Activity  . Alcohol use: No    Alcohol/week: 0.0 standard drinks  . Drug use: No  . Sexual activity: Yes    Partners: Female  Lifestyle  . Physical activity    Days per week: 1 day    Minutes per session: 20 min  . Stress: Only a little  Relationships  . Social connections    Talks on phone: More than three times a week    Gets  together: More than three times a week    Attends religious service: More than 4 times per year    Active member of club or organization: Yes    Attends meetings of clubs or organizations: More than 4 times per year    Relationship status: Married  . Intimate partner violence    Fear of current or ex partner: No    Emotionally abused: No    Physically abused: No    Forced sexual activity: No  Other Topics Concern  . 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 She watches them after school about twice a week   Husband is a Chartered loss adjuster, works on cars, but does not seem to care about their house     Current Outpatient Medications:  .  acetaminophen (TYLENOL) 650 MG CR tablet, Take 1 tablet (650 mg total) by mouth as needed for pain., Disp: 30 tablet, Rfl: 0 .  Coenzyme Q10 (CO Q 10 PO), Take 300 mg by mouth daily., Disp: , Rfl:  .  hydrALAZINE (APRESOLINE) 10 MG tablet, Take 1 tablet (10 mg total) by mouth 3 (three) times daily. Prn bp above 150/90, Disp: 30 tablet, Rfl: 0 .  ibuprofen (ADVIL,MOTRIN) 200 MG tablet, Take 200 mg by mouth every 6 (six) hours as needed. Reported on 11/18/2015, Disp: , Rfl:  .  Multiple Vitamins-Minerals (AIRBORNE PO), Take 1 tablet by mouth as needed. , Disp: , Rfl:  .  rosuvastatin (CRESTOR) 5 MG tablet, Take 1 tablet (5 mg total) by mouth 3 (three) times a week., Disp: 36 tablet, Rfl: 1 .  telmisartan-hydrochlorothiazide (MICARDIS HCT) 40-12.5 MG tablet, Take 1 tablet by mouth daily., Disp: 30 tablet, Rfl: 3 .  fluticasone (FLONASE) 50 MCG/ACT nasal spray, Place 2 sprays into both nostrils daily. (Patient not taking: Reported on 02/05/2019), Disp: 16 g, Rfl: 6 .  loratadine (CLARITIN) 10 MG tablet, Take 1 tablet (10 mg total) by mouth daily. (Patient not taking:  Reported on 02/05/2019), Disp: 30 tablet, Rfl: 0  No Known Allergies  I personally reviewed active problem list, medication list, allergies, family history, social history, health maintenance with the patient/caregiver today.   ROS  Constitutional: Negative for fever or weight change.  Respiratory: Negative for cough and shortness of breath.   Cardiovascular: Negative for chest pain or palpitations.  Gastrointestinal: Negative for abdominal pain, no bowel changes.  Musculoskeletal: Negative for gait problem  or joint swelling.  Skin: Negative for rash.  Neurological: Negative for dizziness or headache.  No other specific complaints in a complete review of systems (except as listed in HPI above).  Objective  Vitals:   02/05/19 1042 02/05/19 1052 02/05/19 1104  BP: (!) 180/100 (!) 160/100 (!) 142/92  Pulse: 92    Resp: 16    Temp: (!) 96.9 F (36.1 C)    TempSrc: Temporal    SpO2: 96%    Weight: 147 lb 6.4 oz (66.9 kg)    Height: 5\' 1"  (1.549 m)      Body mass index is 27.85 kg/m.  Physical Exam  Constitutional: Patient appears well-developed and well-nourished. Overweight. No distress.  HEENT: head atraumatic, normocephalic, pupils equal and reactive to light Cardiovascular: Normal rate, regular rhythm and normal heart sounds.  No murmur heard. No BLE edema. Pulmonary/Chest: Effort normal and breath sounds normal. No respiratory distress. Abdominal: Soft.  There is no tenderness. Psychiatric: Patient has a normal mood and affect. behavior is normal. Judgment and thought content normal.   Recent Results (from the past 2160 hour(s))  Lipid panel     Status: Abnormal   Collection Time: 12/24/18 10:37 AM  Result Value Ref Range   Cholesterol 212 (H) <200 mg/dL   HDL 45 (L) > OR = 50 mg/dL   Triglycerides 161240 (H) <150 mg/dL    Comment: . If a non-fasting specimen was collected, consider repeat triglyceride testing on a fasting specimen if clinically indicated.   Perry MountJacobson et al. J. of Clin. Lipidol. 2015;9:129-169. Marland Kitchen.    LDL Cholesterol (Calc) 128 (H) mg/dL (calc)    Comment: Reference range: <100 . Desirable range <100 mg/dL for primary prevention;   <70 mg/dL for patients with CHD or diabetic patients  with > or = 2 CHD risk factors. Marland Kitchen. LDL-C is now calculated using the Martin-Hopkins  calculation, which is a validated novel method providing  better accuracy than the Friedewald equation in the  estimation of LDL-C.  Horald PollenMartin SS et al. Lenox AhrJAMA. 0960;454(092013;310(19): 2061-2068  (http://education.[facility].com/faq/FAQ164)    Total CHOL/HDL Ratio 4.7 <5.0 (calc)   Non-HDL Cholesterol (Calc) 167 (H) <130 mg/dL (calc)    Comment: For patients with diabetes plus 1 major ASCVD risk  factor, treating to a non-HDL-C goal of <100 mg/dL  (LDL-C of <81<70 mg/dL) is considered a therapeutic  option.   COMPLETE METABOLIC PANEL WITH GFR     Status: Abnormal   Collection Time: 12/24/18 10:37 AM  Result Value Ref Range   Glucose, Bld 112 (H) 65 - 99 mg/dL    Comment: .            Fasting reference interval . For someone without known diabetes, a glucose value between 100 and 125 mg/dL is consistent with prediabetes and should be confirmed with a follow-up test. .    BUN 11 7 - 25 mg/dL   Creat 1.910.74 4.780.50 - 2.950.99 mg/dL    Comment: For patients >10749 years of age, the reference limit for Creatinine is approximately 13% higher for people identified as African-American. .    GFR, Est Non African American 85 > OR = 60 mL/min/1.1073m2   GFR, Est African American 99 > OR = 60 mL/min/1.3773m2   BUN/Creatinine Ratio NOT APPLICABLE 6 - 22 (calc)   Sodium 141 135 - 146 mmol/L   Potassium 4.4 3.5 - 5.3 mmol/L   Chloride 103 98 - 110 mmol/L   CO2 27 20 - 32 mmol/L   Calcium  9.8 8.6 - 10.4 mg/dL   Total Protein 7.1 6.1 - 8.1 g/dL   Albumin 4.6 3.6 - 5.1 g/dL   Globulin 2.5 1.9 - 3.7 g/dL (calc)   AG Ratio 1.8 1.0 - 2.5 (calc)   Total Bilirubin 0.6 0.2 - 1.2 mg/dL    Alkaline phosphatase (APISO) 89 37 - 153 U/L   AST 18 10 - 35 U/L   ALT 9 6 - 29 U/L  CBC with Differential/Platelet     Status: None   Collection Time: 12/24/18 10:37 AM  Result Value Ref Range   WBC 7.6 3.8 - 10.8 Thousand/uL   RBC 4.44 3.80 - 5.10 Million/uL   Hemoglobin 14.3 11.7 - 15.5 g/dL   HCT 16.1 09.6 - 04.5 %   MCV 93.5 80.0 - 100.0 fL   MCH 32.2 27.0 - 33.0 pg   MCHC 34.5 32.0 - 36.0 g/dL   RDW 40.9 81.1 - 91.4 %   Platelets 347 140 - 400 Thousand/uL   MPV 10.0 7.5 - 12.5 fL   Neutro Abs 5,046 1,500 - 7,800 cells/uL   Lymphs Abs 1,740 850 - 3,900 cells/uL   Absolute Monocytes 570 200 - 950 cells/uL   Eosinophils Absolute 213 15 - 500 cells/uL   Basophils Absolute 30 0 - 200 cells/uL   Neutrophils Relative % 66.4 %   Total Lymphocyte 22.9 %   Monocytes Relative 7.5 %   Eosinophils Relative 2.8 %   Basophils Relative 0.4 %  Hemoglobin A1c     Status: Abnormal   Collection Time: 12/24/18 10:37 AM  Result Value Ref Range   Hgb A1c MFr Bld 5.8 (H) <5.7 % of total Hgb    Comment: For someone without known diabetes, a hemoglobin  A1c value between 5.7% and 6.4% is consistent with prediabetes and should be confirmed with a  follow-up test. . For someone with known diabetes, a value <7% indicates that their diabetes is well controlled. A1c targets should be individualized based on duration of diabetes, age, comorbid conditions, and other considerations. . This assay result is consistent with an increased risk of diabetes. . Currently, no consensus exists regarding use of hemoglobin A1c for diagnosis of diabetes for children. .    Mean Plasma Glucose 120 (calc)   eAG (mmol/L) 6.6 (calc)  Microalbumin / creatinine urine ratio     Status: None   Collection Time: 12/24/18 10:37 AM  Result Value Ref Range   Creatinine, Urine 31 20 - 275 mg/dL   Microalb, Ur <7.8 mg/dL    Comment: Reference Range Not established    Microalb Creat Ratio NOTE <30 mcg/mg creat     Comment: NOTE: The urine albumin value is less than  0.2 mg/dL therefore we are unable to calculate  excretion and/or creatinine ratio. . The ADA defines abnormalities in albumin excretion as follows: Marland Kitchen Category         Result (mcg/mg creatinine) . Normal                    <30 Microalbuminuria         30-299  Clinical albuminuria   > OR = 300 . The ADA recommends that at least two of three specimens collected within a 3-6 month period be abnormal before considering a patient to be within a diagnostic category.       PHQ2/9: Depression screen [facility] 2/9 02/05/2019 12/24/2018 08/26/2018 06/06/2018 11/14/2017  Decreased Interest 0 0 0 1 1  Down, Depressed, Hopeless 1  0 0 1 1  PHQ - 2 Score 1 0 0 2 2  Altered sleeping 1 0 0 0 1  Tired, decreased energy 1 0 0 1 1  Change in appetite 0 0 0 0 1  Feeling bad or failure about yourself  0 0 0 1 1  Trouble concentrating 1 0 0 0 0  Moving slowly or fidgety/restless 0 0 0 0 0  Suicidal thoughts 0 0 0 0 0  PHQ-9 Score 4 0 0 4 6  Difficult doing work/chores - - - Not difficult at all Somewhat difficult    phq 9 is positive  GAD 7 : Generalized Anxiety Score 02/05/2019 11/14/2017 11/18/2015 05/20/2015  Nervous, Anxious, on Edge 1 1 3 3   Control/stop worrying 3 1 3 2   Worry too much - different things 3 1 3 3   Trouble relaxing 0 0 0 1  Restless 1 0 0 0  Easily annoyed or irritable 1 1 3 3   Afraid - awful might happen 3 1 3 3   Total GAD 7 Score 12 5 15 15   Anxiety Difficulty Not difficult at all Somewhat difficult Somewhat difficult Somewhat difficult     Fall Risk: Fall Risk  02/05/2019 08/26/2018 06/06/2018 11/14/2017 02/20/2017  Falls in the past year? 0 0 0 No No  Number falls in past yr: 0 0 0 - -  Injury with Fall? 0 0 0 - -     Assessment & Plan  1. White coat syndrome with diagnosis of hypertension  - telmisartan-hydrochlorothiazide (MICARDIS HCT) 40-12.5 MG tablet; Take 1 tablet by mouth daily.  Dispense: 30 tablet; Refill:  3  2. Need for immunization against influenza  - Flu Vaccine QUAD High Dose(Fluad)  3. Dyslipidemia  - rosuvastatin (CRESTOR) 5 MG tablet; Take 1 tablet (5 mg total) by mouth 3 (three) times a week.  Dispense: 36 tablet; Refill: 1  4. Hyperglycemia   5. GAD (generalized anxiety disorder)  She refuses medication or counseling   6. Pre-diabetes  Continue life style modification   7. Tachycardia, paroxysmal ([facility]CC)  Doing well avoiding caffeine

## 2019-05-09 ENCOUNTER — Other Ambulatory Visit: Payer: Self-pay | Admitting: Family Medicine

## 2019-05-09 DIAGNOSIS — E785 Hyperlipidemia, unspecified: Secondary | ICD-10-CM

## 2019-06-19 ENCOUNTER — Other Ambulatory Visit: Payer: Self-pay

## 2019-06-19 ENCOUNTER — Ambulatory Visit: Payer: BC Managed Care – PPO | Attending: Internal Medicine

## 2019-06-19 DIAGNOSIS — Z23 Encounter for immunization: Secondary | ICD-10-CM | POA: Insufficient documentation

## 2019-06-19 NOTE — Progress Notes (Signed)
   Covid-19 Vaccination Clinic  Name:  CAPRICE MCCAFFREY    MRN: [id number] DOB: March 15, 1956  06/19/2019  Ms. Bencosme was observed post Covid-19 immunization for 15 minutes without incidence. She was provided with Vaccine Information Sheet and instruction to access the V-Safe system.   Ms. Hoffert was instructed to call 911 with any severe reactions post vaccine: Marland Kitchen Difficulty breathing  . Swelling of your face and throat  . A fast heartbeat  . A bad rash all over your body  . Dizziness and weakness    Immunizations Administered    Name Date Dose VIS Date Route   Pfizer COVID-19 Vaccine 06/19/2019  9:00 AM 0.3 mL 04/18/2019 Intramuscular   Manufacturer: [employer], [employer]   Lot: [id number]   NDC: [address]-3

## 2019-07-16 ENCOUNTER — Ambulatory Visit: Payer: BC Managed Care – PPO | Attending: Internal Medicine

## 2019-07-16 DIAGNOSIS — Z23 Encounter for immunization: Secondary | ICD-10-CM | POA: Insufficient documentation

## 2019-07-16 NOTE — Progress Notes (Signed)
   Covid-19 Vaccination Clinic  Name:  ALANTA SCOBEY    MRN: [id number] DOB: [birth year]  07/16/2019  Ms. Vohs was observed post Covid-19 immunization for 15 minutes without incident. She was provided with Vaccine Information Sheet and instruction to access the V-Safe system.   Ms. Sampey was instructed to call 911 with any severe reactions post vaccine: Marland Kitchen Difficulty breathing  . Swelling of face and throat  . A fast heartbeat  . A bad rash all over body  . Dizziness and weakness   Immunizations Administered    Name Date Dose VIS Date Route   Pfizer COVID-19 Vaccine 07/16/2019  9:26 AM 0.3 mL 04/18/2019 Intramuscular   Manufacturer: [employer], [employer]   Lot: [id number]   NDC: [address]-6

## 2019-08-05 ENCOUNTER — Telehealth: Payer: Self-pay

## 2019-08-05 ENCOUNTER — Ambulatory Visit (INDEPENDENT_AMBULATORY_CARE_PROVIDER_SITE_OTHER): Payer: Medicare HMO | Admitting: Family Medicine

## 2019-08-05 ENCOUNTER — Other Ambulatory Visit: Payer: Self-pay

## 2019-08-05 ENCOUNTER — Encounter: Payer: Self-pay | Admitting: Family Medicine

## 2019-08-05 VITALS — BP 180/110 | HR 89 | Temp 96.9°F | Resp 16 | Ht 61.0 in | Wt 149.8 lb

## 2019-08-05 DIAGNOSIS — F411 Generalized anxiety disorder: Secondary | ICD-10-CM

## 2019-08-05 DIAGNOSIS — I10 Essential (primary) hypertension: Secondary | ICD-10-CM | POA: Diagnosis not present

## 2019-08-05 DIAGNOSIS — E785 Hyperlipidemia, unspecified: Secondary | ICD-10-CM | POA: Diagnosis not present

## 2019-08-05 DIAGNOSIS — R739 Hyperglycemia, unspecified: Secondary | ICD-10-CM | POA: Diagnosis not present

## 2019-08-05 DIAGNOSIS — R7303 Prediabetes: Secondary | ICD-10-CM

## 2019-08-05 DIAGNOSIS — R69 Illness, unspecified: Secondary | ICD-10-CM | POA: Diagnosis not present

## 2019-08-05 DIAGNOSIS — I479 Paroxysmal tachycardia, unspecified: Secondary | ICD-10-CM | POA: Diagnosis not present

## 2019-08-05 MED ORDER — ROSUVASTATIN CALCIUM 5 MG PO TABS: 5.0000 mg | ORAL_TABLET | ORAL | 1 refills | Status: DC

## 2019-08-05 MED ORDER — TELMISARTAN-HCTZ 80-25 MG PO TABS: 0.5000 | ORAL_TABLET | Freq: Every day | ORAL | 0 refills | Status: DC

## 2019-08-05 NOTE — Telephone Encounter (Signed)
Copied from CRM [id number]. Topic: General - Other >> Aug 05, 2019  1:26 PM Gwenlyn Fudge wrote: Reason for CRM: Pt called stating that after her appt today that she went home and rechecked her BP levels and it had come down to 118/78. Please advise.

## 2019-08-05 NOTE — Addendum Note (Signed)
Addended by: Alba Cory F on: 08/05/2019 01:04 PM   Modules accepted: Orders

## 2019-08-05 NOTE — Progress Notes (Signed)
Can you resend Crestor.

## 2019-08-05 NOTE — Progress Notes (Signed)
Name: Jodi Fernandez   MRN: [id number]    DOB: 1955/06/15   Date:08/05/2019       Progress Note  Subjective  Chief Complaint  Chief Complaint  Patient presents with  . Hypertension  . Hyperlipidemia  . Prediabetes    HPI  HTN with white coat hypertension: : bp at home has been 115-120's/70's-80's, today before she came it was 130/90 when she left her house and that is not normal, when she arrived bp was very high.  She denies chest pain or palpitation lately. She has a history of paroxysmal tachycardia, but could not tolerate medication to control rate, she is cutting down on caffeine , one cup of coffee in the am's still drinks Dr. Reino Kent at night . She is under a lot of stress, we will adjust dose of micardis to 80/25 and she will try taking half to one depending on bp reading at home , she will also return later this week for a bp in the next couple of weeks for cma visit    Pre-diabetes: last hgbA1C was 5.8% and fasting glucose also above 100. She denies polyphagia, polydipsia or polyuria. We will continue to monitor    Hyperlipidemia: on statin therapy, denies myalgias. She takes medication a few times a week., ASCVD is high but unable to take statins more often because it cause joint pains. Unchanged   GAD and dysthymia: there is a family history of depression ( mother and sister) , she lost some friends due to COVID-19, husband had a stent placed, her 43 yo grandson had a concussion recently , sister in law recently diagnosed with breast cancer, she has been more worried about them.  She states she is able to function - gets up, does her chores. She states she has been sleeping better lately   Patient Active Problem List   Diagnosis Date Noted  . White coat syndrome with diagnosis of hypertension 02/05/2019  . Osteopenia after menopause 01/22/2019  . 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 Bilateral tinnitus 11/14/2017  .  Tachycardia, paroxysmal ([facility]CC) 07/25/2016  . GAD (generalized anxiety disorder) 11/11/2014  . Insomnia, persistent 11/11/2014  . Dyspareunia 11/11/2014  . Generalized headache 11/11/2014  . Bursitis, trochanteric 11/11/2014  . History of Rocky Mountain spotted fever 04/11/2014  . Dyslipidemia 10/29/2006  . Benign hypertension 10/29/2006  . Allergic rhinitis 10/29/2006    Past Surgical History:  Procedure Laterality Date  . COLONOSCOPY    . TUBAL LIGATION  1987    Family History  Problem Relation Age of Onset  . Heart disease Mother   . Diabetes Mother   . Alcohol abuse Father   . Heart disease Father   . Heart disease Sister   . Hypertension Brother   . Hypertension Brother   . Hypertension Brother   . Hypertension Brother   . Breast cancer Paternal Aunt 110  . Breast cancer Cousin     Social History   Tobacco Use  . Smoking status: Never Smoker  . Smokeless tobacco: Never Used  Substance Use Topics  . Alcohol use: No    Alcohol/week: 0.0 standard drinks     Current Outpatient Medications:  .  acetaminophen (TYLENOL) 650 MG CR tablet, Take 1 tablet (650 mg total) by mouth as needed for pain., Disp: 30 tablet, Rfl: 0 .  Coenzyme Q10 (CO Q 10 PO), Take 300 mg by mouth daily., Disp: , Rfl:  .  hydrALAZINE (  APRESOLINE) 10 MG tablet, Take 1 tablet (10 mg total) by mouth 3 (three) times daily. Prn bp above 150/90, Disp: 30 tablet, Rfl: 0 .  ibuprofen (ADVIL,MOTRIN) 200 MG tablet, Take 200 mg by mouth every 6 (six) hours as needed. Reported on 11/18/2015, Disp: , Rfl:  .  Multiple Vitamins-Minerals (AIRBORNE PO), Take 1 tablet by mouth as needed. , Disp: , Rfl:  .  [START ON 08/06/2019] rosuvastatin (CRESTOR) 5 MG tablet, Take 1 tablet (5 mg total) by mouth 3 (three) times a week., Disp: 36 tablet, Rfl: 1 .  fluticasone (FLONASE) 50 MCG/ACT nasal spray, Place 2 sprays into both nostrils daily. (Patient not taking: Reported on 08/05/2019), Disp: 16 g, Rfl: 6 .  loratadine  (CLARITIN) 10 MG tablet, Take 1 tablet (10 mg total) by mouth daily. (Patient not taking: Reported on 08/05/2019), Disp: 30 tablet, Rfl: 0 .  telmisartan-hydrochlorothiazide (MICARDIS HCT) 80-25 MG tablet, Take 0.5-1 tablets by mouth daily., Disp: 90 tablet, Rfl: 0  No Known Allergies  I personally reviewed active problem list, medication list, allergies, family history, social history, health maintenance with the patient/caregiver today.   ROS  Constitutional: Negative for fever or weight change.  Respiratory: Negative for cough and shortness of breath.   Cardiovascular: Negative for chest pain or palpitations.  Gastrointestinal: Negative for abdominal pain, no bowel changes.  Musculoskeletal: Negative for gait problem or joint swelling.  Skin: Negative for rash.  Neurological: Negative for dizziness or headache.  No other specific complaints in a complete review of systems (except as listed in HPI above).  Objective  Vitals:   08/05/19 1017 08/05/19 1032 08/05/19 1133  BP: (!) 190/110 (!) 160/100 (!) 180/110  Pulse: 89    Resp: 16    Temp: (!) 96.9 F (36.1 C)    TempSrc: Temporal    SpO2: 98%    Weight: 149 lb 12.8 oz (67.9 kg)    Height: 5\' 1"  (1.549 m)       Body mass index is 28.3 kg/m.  Physical Exam  Constitutional: Patient appears well-developed and well-nourished. Overweight.  No distress.  HEENT: head atraumatic, normocephalic, pupils equal and reactive to light, Cardiovascular: Normal rate, regular rhythm and normal heart sounds.  No murmur heard. No BLE edema. Pulmonary/Chest: Effort normal and breath sounds normal. No respiratory distress. Abdominal: Soft.  There is no tenderness. Psychiatric: Patient has a normal mood and affect. behavior is normal. Judgment and thought content normal.  PHQ2/9: Depression screen [facility] 2/9 08/05/2019 02/05/2019 12/24/2018 08/26/2018 06/06/2018  Decreased Interest 0 0 0 0 1  Down, Depressed, Hopeless 0 1 0 0 1  PHQ - 2 Score 0  1 0 0 2  Altered sleeping 0 1 0 0 0  Tired, decreased energy 0 1 0 0 1  Change in appetite 0 0 0 0 0  Feeling bad or failure about yourself  0 0 0 0 1  Trouble concentrating 0 1 0 0 0  Moving slowly or fidgety/restless 0 0 0 0 0  Suicidal thoughts 0 0 0 0 0  PHQ-9 Score 0 4 0 0 4  Difficult doing work/chores - - - - Not difficult at all    phq 9 is negative   Fall Risk: Fall Risk  08/05/2019 02/05/2019 08/26/2018 06/06/2018 11/14/2017  Falls in the past year? 0 0 0 0 No  Number falls in past yr: 0 0 0 0 -  Injury with Fall? 0 0 0 0 -     Functional Status  Survey: Is the patient deaf or have difficulty hearing?: Yes Does the patient have difficulty seeing, even when wearing glasses/contacts?: No Does the patient have difficulty concentrating, remembering, or making decisions?: No Does the patient have difficulty walking or climbing stairs?: No Does the patient have difficulty dressing or bathing?: No Does the patient have difficulty doing errands alone such as visiting a doctor's office or shopping?: No    Assessment & Plan  1. GAD (generalized anxiety disorder)   2. White coat syndrome with diagnosis of hypertension  - telmisartan-hydrochlorothiazide (MICARDIS HCT) 80-25 MG tablet; Take 0.5-1 tablets by mouth daily.  Dispense: 90 tablet; Refill: 0  3. Pre-diabetes  She gained some weight, will resume a healthier diet   4. Dyslipidemia  Taking Crestor a few times a week   5. Tachycardia, paroxysmal (HCC)  No recent episodes of palpitation   6. Hyperglycemia

## 2019-08-06 NOTE — Telephone Encounter (Signed)
Patient notified "She can take the current dose of Telmisartan-HCTZ medication, 40/12.5 - take half of the dose I sent unless bp spikes".

## 2019-08-18 ENCOUNTER — Other Ambulatory Visit: Payer: Self-pay | Admitting: Family Medicine

## 2019-08-18 ENCOUNTER — Ambulatory Visit: Payer: Medicare HMO

## 2019-08-18 ENCOUNTER — Other Ambulatory Visit: Payer: Self-pay

## 2019-08-18 VITALS — BP 161/100

## 2019-08-18 DIAGNOSIS — I10 Essential (primary) hypertension: Secondary | ICD-10-CM

## 2019-08-18 NOTE — Progress Notes (Signed)
Patient here for Blood pressure recheck.  Today was 161/100 and pt brought her machine that we checked and said 159/96.  At home patient is getting normal readings and pt does have white coat.  Results discussed with Dr. Carlynn Purl and she can continue lower dose of bp med micardis 40/12.5 and Sowles instructed her to take a hydralazine 30 mins prior to her future appointments.

## 2019-10-17 ENCOUNTER — Other Ambulatory Visit: Payer: Self-pay | Admitting: Family Medicine

## 2019-10-17 DIAGNOSIS — I10 Essential (primary) hypertension: Secondary | ICD-10-CM

## 2019-10-28 ENCOUNTER — Telehealth: Payer: Self-pay | Admitting: Family Medicine

## 2019-10-28 NOTE — Chronic Care Management (AMB) (Signed)
  Chronic Care Management   Note  10/28/2019 Name: Jodi Fernandez MRN: [id number] DOB: 04-Jun-1955  Jodi Fernandez is a 64 y.o. year old female who is a primary care patient of Steele Sizer, MD. I reached out to Jodi Fernandez by phone today in response to a referral sent by [name]'s health plan.     [name] was given information about Chronic Care Management services today including:  1. CCM service includes personalized support from designated clinical staff supervised by her physician, including individualized plan of care and coordination with other care providers 2. 24/7 contact phone numbers for assistance for urgent and routine care needs. 3. Service will only be billed when office clinical staff spend 20 minutes or more in a month to coordinate care. 4. Only one practitioner may furnish and bill the service in a calendar month. 5. The patient may stop CCM services at any time (effective at the end of the month) by phone call to the office staff. 6. The patient will be responsible for cost sharing (co-pay) of up to 20% of the service fee (after annual deductible is met).  Patient did not agree to enrollment in care management services and does not wish to consider at this time.  Follow up plan: The patient has been provided with contact information for the care management team and has been advised to call with any health related questions or concerns.   Noreene Larsson, [place], [place], [place] [address] Direct Dial: [phone] [email or web address]: [email or web address]

## 2019-11-05 ENCOUNTER — Other Ambulatory Visit: Payer: Self-pay

## 2019-11-05 ENCOUNTER — Encounter: Payer: Self-pay | Admitting: Family Medicine

## 2019-11-05 ENCOUNTER — Ambulatory Visit (INDEPENDENT_AMBULATORY_CARE_PROVIDER_SITE_OTHER): Payer: Medicare HMO | Admitting: Family Medicine

## 2019-11-05 VITALS — BP 132/86 | HR 92 | Temp 97.9°F | Resp 14 | Ht 61.0 in | Wt 148.2 lb

## 2019-11-05 DIAGNOSIS — R7303 Prediabetes: Secondary | ICD-10-CM

## 2019-11-05 DIAGNOSIS — E785 Hyperlipidemia, unspecified: Secondary | ICD-10-CM | POA: Diagnosis not present

## 2019-11-05 DIAGNOSIS — I479 Paroxysmal tachycardia, unspecified: Secondary | ICD-10-CM | POA: Diagnosis not present

## 2019-11-05 DIAGNOSIS — R69 Illness, unspecified: Secondary | ICD-10-CM | POA: Diagnosis not present

## 2019-11-05 DIAGNOSIS — R739 Hyperglycemia, unspecified: Secondary | ICD-10-CM | POA: Diagnosis not present

## 2019-11-05 DIAGNOSIS — I10 Essential (primary) hypertension: Secondary | ICD-10-CM

## 2019-11-05 DIAGNOSIS — F411 Generalized anxiety disorder: Secondary | ICD-10-CM

## 2019-11-05 MED ORDER — TELMISARTAN-HCTZ 40-12.5 MG PO TABS: 1.0000 | ORAL_TABLET | Freq: Every day | ORAL | 0 refills | Status: DC

## 2019-11-05 NOTE — Progress Notes (Signed)
Name: Jodi Fernandez   MRN: [id number]    DOB: Oct 15, 1955   Date:11/05/2019       Progress Note  Subjective  Chief Complaint  Chief Complaint  Patient presents with  . Follow-up    3 months  . Anxiety  . Hypertension    HPI   HTNwith white coat hypertension:: bp at home has [phone] today she took hydralazine prior to her visit and bp is normal. . She denies chest pain or palpitation lately. She has a history of paroxysmal tachycardia, but could not tolerate medication to control rate, she is cutting down on caffeine , one cup of coffee in the am's still drinks Dr. Reino Kent at night . She states not as stressed lately.   Pre-diabetes: last hgbA1C was 5.8% and fasting glucose also above 100. She denies polyphagia, polydipsia or polyuria.We will recheck labs today   Hyperlipidemia: on statin therapy, denies myalgias. She takes medication a few times a week., ASCVD is high but unable to take statins more often because it cause joint pains. We will recheck labs today   GAD and dysthymia: there is a family history of depression ( mother and sister) ,she states stress is down, still worries about her husband had a stent, has chronic back pain and DM. Daughter is moving to [place] and she will miss her, but proud of her   Patient Active Problem List   Diagnosis Date Noted  . White coat syndrome with diagnosis of hypertension 02/05/2019  . Osteopenia after menopause 01/22/2019  . 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 Bilateral tinnitus 11/14/2017  . Tachycardia, paroxysmal (HCC) 07/25/2016  . GAD (generalized anxiety disorder) 11/11/2014  . Insomnia, persistent 11/11/2014  . Dyspareunia 11/11/2014  . Generalized headache 11/11/2014  . Bursitis, trochanteric 11/11/2014  . History of [place] spotted fever 04/11/2014  . Dyslipidemia 10/29/2006  . Benign hypertension 10/29/2006  . Allergic rhinitis 10/29/2006     Past Surgical History:  Procedure Laterality Date  . COLONOSCOPY    . TUBAL LIGATION  1987    Family History  Problem Relation Age of Onset  . Heart disease Mother   . Diabetes Mother   . Alcohol abuse Father   . Heart disease Father   . Heart disease Sister   . Hypertension Brother   . Hypertension Brother   . Hypertension Brother   . Hypertension Brother   . Breast cancer Paternal Aunt 70  . Breast cancer Cousin     Social History   Tobacco Use  . Smoking status: Never Smoker  . Smokeless tobacco: Never Used  Substance Use Topics  . Alcohol use: No    Alcohol/week: 0.0 standard drinks     Current Outpatient Medications:  .  acetaminophen (TYLENOL) 650 MG CR tablet, Take 1 tablet (650 mg total) by mouth as needed for pain., Disp: 30 tablet, Rfl: 0 .  Coenzyme Q10 (CO Q 10 PO), Take 300 mg by mouth daily., Disp: , Rfl:  .  hydrALAZINE (APRESOLINE) 10 MG tablet, Take 1 tablet (10 mg total) by mouth 3 (three) times daily. Prn bp above 150/90, Disp: 30 tablet, Rfl: 0 .  ibuprofen (ADVIL,MOTRIN) 200 MG tablet, Take 200 mg by mouth every 6 (six) hours as needed. Reported on 11/18/2015, Disp: , Rfl:  .  rosuvastatin (CRESTOR) 5 MG tablet, Take 1 tablet (5 mg total) by mouth 3 (three) times a week., Disp: 36 tablet, Rfl: 1 .  telmisartan-hydrochlorothiazide (MICARDIS HCT) 40-12.5 MG tablet, Take 1 tablet by mouth daily., Disp: 90 tablet, Rfl: 0  No Known Allergies  I personally reviewed active problem list, medication list, allergies, family history, social history, health maintenance with the patient/caregiver today.   ROS  Constitutional: Negative for fever or weight change.  Respiratory: Negative for cough and shortness of breath.   Cardiovascular: Negative for chest pain or palpitations.  Gastrointestinal: Negative for abdominal pain, no bowel changes.  Musculoskeletal: Negative for gait problem or joint swelling.  Skin: Negative for rash.  Neurological:  Negative for dizziness or headache.  No other specific complaints in a complete review of systems (except as listed in HPI above).  Objective  Vitals:   11/05/19 0920  BP: 132/86  Pulse: 92  Resp: 14  Temp: 97.9 F (36.6 C)  TempSrc: Temporal  SpO2: 96%  Weight: 148 lb 3.2 oz (67.2 kg)  Height: 5\' 1"  (1.549 m)    Body mass index is 28 kg/m.  Physical Exam  Constitutional: Patient appears well-developed and well-nourished. Overweight.  No distress.  HEENT: head atraumatic, normocephalic, pupils equal and reactive to light, neck supple Cardiovascular: Normal rate, regular rhythm and normal heart sounds.  No murmur heard. No BLE edema. Pulmonary/Chest: Effort normal and breath sounds normal. No respiratory distress. Abdominal: Soft.  There is no tenderness. Psychiatric: Patient has a normal mood and affect. behavior is normal. Judgment and thought content normal.  PHQ2/9: Depression screen [facility] 2/9 11/05/2019 08/05/2019 02/05/2019 12/24/2018 08/26/2018  Decreased Interest 0 0 0 0 0  Down, Depressed, Hopeless 0 0 1 0 0  PHQ - 2 Score 0 0 1 0 0  Altered sleeping 0 0 1 0 0  Tired, decreased energy 0 0 1 0 0  Change in appetite 0 0 0 0 0  Feeling bad or failure about yourself  0 0 0 0 0  Trouble concentrating 0 0 1 0 0  Moving slowly or fidgety/restless 0 0 0 0 0  Suicidal thoughts 0 0 0 0 0  PHQ-9 Score 0 0 4 0 0  Difficult doing work/chores - - - - -  Some recent data might be hidden    phq 9 is negative  GAD 7 : Generalized Anxiety Score 11/05/2019 02/05/2019 11/14/2017 11/18/2015  Nervous, Anxious, on Edge 3 1 1 3   Control/stop worrying 1 3 1 3   Worry too much - different things 1 3 1 3   Trouble relaxing 1 0 0 0  Restless 1 1 0 0  Easily annoyed or irritable 0 1 1 3   Afraid - awful might happen 1 3 1 3   Total GAD 7 Score 8 12 5 15   Anxiety Difficulty Somewhat difficult Not difficult at all Somewhat difficult Somewhat difficult    Improved   Fall Risk: Fall Risk   11/05/2019 08/05/2019 02/05/2019 08/26/2018 06/06/2018  Falls in the past year? 0 0 0 0 0  Number falls in past yr: 0 0 0 0 0  Injury with Fall? 0 0 0 0 0     Functional Status Survey: Is the patient deaf or have difficulty hearing?: No Does the patient have difficulty seeing, even when wearing glasses/contacts?: No Does the patient have difficulty concentrating, remembering, or making decisions?: No Does the patient have difficulty walking or climbing stairs?: No Does the patient have difficulty dressing or bathing?: No Does the patient have difficulty doing errands alone such as visiting a doctor's office or shopping?: No    Assessment & Plan  1. White coat syndrome with diagnosis of hypertension  - telmisartan-hydrochlorothiazide (MICARDIS HCT) 40-12.5 MG tablet; Take 1 tablet by mouth daily.  Dispense: 90 tablet; Refill: 0 - CBC with Differential/Platelet - COMPLETE METABOLIC PANEL WITH GFR  2. GAD (generalized anxiety disorder)  Doing well   3. Pre-diabetes   4. Tachycardia, paroxysmal (HCC)  Doing well   5. Dyslipidemia  - Lipid panel  6. Hyperglycemia  - Hemoglobin A1c

## 2019-11-06 LAB — CBC WITH DIFFERENTIAL/PLATELET
Absolute Monocytes: 666 cells/uL (ref 200–950)
Basophils Absolute: 54 cells/uL (ref 0–200)
Basophils Relative: 0.6 %
Eosinophils Absolute: 225 cells/uL (ref 15–500)
Eosinophils Relative: 2.5 %
HCT: 41.5 % (ref 35.0–45.0)
Hemoglobin: 14.4 g/dL (ref 11.7–15.5)
Lymphs Abs: 1989 cells/uL (ref 850–3900)
MCH: 32.7 pg (ref 27.0–33.0)
MCHC: 34.7 g/dL (ref 32.0–36.0)
MCV: 94.3 fL (ref 80.0–100.0)
MPV: 9.8 fL (ref 7.5–12.5)
Monocytes Relative: 7.4 %
Neutro Abs: 6066 cells/uL (ref 1500–7800)
Neutrophils Relative %: 67.4 %
Platelets: 333 10*3/uL (ref 140–400)
RBC: 4.4 10*6/uL (ref 3.80–5.10)
RDW: 11.8 % (ref 11.0–15.0)
Total Lymphocyte: 22.1 %
WBC: 9 10*3/uL (ref 3.8–10.8)

## 2019-11-06 LAB — COMPLETE METABOLIC PANEL WITH GFR
AG Ratio: 1.7 (calc) (ref 1.0–2.5)
ALT: 10 U/L (ref 6–29)
AST: 19 U/L (ref 10–35)
Albumin: 4.6 g/dL (ref 3.6–5.1)
Alkaline phosphatase (APISO): 97 U/L (ref 37–153)
BUN: 13 mg/dL (ref 7–25)
CO2: 30 mmol/L (ref 20–32)
Calcium: 10.1 mg/dL (ref 8.6–10.4)
Chloride: 102 mmol/L (ref 98–110)
Creat: 0.73 mg/dL (ref 0.50–0.99)
GFR, Est African American: 100 mL/min/{1.73_m2} (ref >=60)
GFR, Est Non African American: 86 mL/min/{1.73_m2} (ref >=60)
Globulin: 2.7 g/dL (calc) (ref 1.9–3.7)
Glucose, Bld: 111 mg/dL — ABNORMAL HIGH (ref 65–99)
Potassium: 4.8 mmol/L (ref 3.5–5.3)
Sodium: 140 mmol/L (ref 135–146)
Total Bilirubin: 0.7 mg/dL (ref 0.2–1.2)
Total Protein: 7.3 g/dL (ref 6.1–8.1)

## 2019-11-06 LAB — LIPID PANEL
Cholesterol: 236 mg/dL — ABNORMAL HIGH (ref <200)
HDL: 51 mg/dL (ref >=50)
LDL Cholesterol (Calc): 152 mg/dL (calc) — ABNORMAL HIGH
Non-HDL Cholesterol (Calc): 185 mg/dL (calc) — ABNORMAL HIGH (ref <130)
Total CHOL/HDL Ratio: 4.6 (calc) (ref <5.0)
Triglycerides: 189 mg/dL — ABNORMAL HIGH (ref <150)

## 2019-11-06 LAB — HEMOGLOBIN A1C
Hgb A1c MFr Bld: 5.8 % of total Hgb — ABNORMAL HIGH (ref <5.7)
Mean Plasma Glucose: 120 (calc)
eAG (mmol/L): 6.6 (calc)

## 2019-12-16 ENCOUNTER — Telehealth: Payer: Self-pay

## 2019-12-16 NOTE — Telephone Encounter (Signed)
Copied from CRM [id number]. Topic: General - Call Back - No Documentation >> Dec 16, 2019 10:39 AM Randol Kern wrote: Reason for CRM: Pt has questions regarding shingles. Her sister has it, however the patient has been vaccinated with both Shingrix vaccines. The pt wants to speak to nurse about its contagiousness, she is concerned that she may pass this along to her grandchildren who have not had their chicken pox vaccines.   Best contact: [phone]

## 2019-12-17 NOTE — Telephone Encounter (Signed)
Sent message to patient via mychart. Tried to call several times no answer.

## 2019-12-30 ENCOUNTER — Ambulatory Visit: Payer: Medicare HMO

## 2020-01-20 ENCOUNTER — Ambulatory Visit (INDEPENDENT_AMBULATORY_CARE_PROVIDER_SITE_OTHER): Payer: Medicare HMO

## 2020-01-20 ENCOUNTER — Ambulatory Visit: Payer: Medicare HMO

## 2020-01-20 ENCOUNTER — Other Ambulatory Visit: Payer: Self-pay

## 2020-01-20 VITALS — BP 138/82 | HR 75 | Temp 97.9°F | Resp 16 | Ht 61.0 in | Wt 149.5 lb

## 2020-01-20 DIAGNOSIS — Z1231 Encounter for screening mammogram for malignant neoplasm of breast: Secondary | ICD-10-CM

## 2020-01-20 DIAGNOSIS — Z0000 Encounter for general adult medical examination without abnormal findings: Secondary | ICD-10-CM

## 2020-01-20 DIAGNOSIS — Z23 Encounter for immunization: Secondary | ICD-10-CM

## 2020-01-20 NOTE — Progress Notes (Signed)
Subjective:   Jodi Fernandez is a 64 y.o. female who presents for an Initial Medicare Annual preventive examination.  Review of Systems     Cardiac Risk Factors include: advanced age (>80men, >14 women);dyslipidemia;hypertension     Objective:    Today's Vitals   01/20/20 1044  BP: 138/82  Pulse: 75  Resp: 16  Temp: 97.9 F (36.6 C)  TempSrc: Oral  SpO2: 98%  Weight: 149 lb 8 oz (67.8 kg)  Height: 5\' 1"  (1.549 m)   Body mass index is 28.25 kg/m.  Advanced Directives 01/20/2020 02/20/2017 11/20/2016 08/21/2016 08/16/2016 07/28/2016 05/22/2016  Does Patient Have a Medical Advance Directive? No No No No No No No  Would patient like information on creating a medical advance directive? Yes (MAU/Ambulatory/Procedural Areas - Information given) - - - - - -    Current Medications (verified) Outpatient Encounter Medications as of 01/20/2020  Medication Sig  . acetaminophen (TYLENOL) 650 MG CR tablet Take 1 tablet (650 mg total) by mouth as needed for pain.  . Coenzyme Q10 (CO Q 10 PO) Take 300 mg by mouth daily.  . hydrALAZINE (APRESOLINE) 10 MG tablet Take 1 tablet (10 mg total) by mouth 3 (three) times daily. Prn bp above 150/90  . ibuprofen (ADVIL,MOTRIN) 200 MG tablet Take 200 mg by mouth every 6 (six) hours as needed. Reported on 11/18/2015  . OVER THE COUNTER MEDICATION Immunity support vitamin with elderberry, zinc, vitamin C  . rosuvastatin (CRESTOR) 5 MG tablet Take 1 tablet (5 mg total) by mouth 3 (three) times a week.  . telmisartan-hydrochlorothiazide (MICARDIS HCT) 40-12.5 MG tablet Take 1 tablet by mouth daily.   No facility-administered encounter medications on file as of 01/20/2020.    Allergies (verified) Patient has no known allergies.   History: Past Medical History:  Diagnosis Date  . Abnormal glucose   . Allergy   . Generalized headache   . Hemorrhoids   . Hyperlipidemia   . Hypertension   . Insomnia   . [facility] spotted fever   . Ventricular  tachycardia (HCC) 08/11/2016   received [facility])   Past Surgical History:  Procedure Laterality Date  . COLONOSCOPY    . TUBAL LIGATION  1987   Family History  Problem Relation Age of Onset  . Heart disease Mother   . Diabetes Mother   . Alcohol abuse Father   . Heart disease Father   . Heart disease Sister   . Hypertension Brother   . Hypertension Brother   . Hypertension Brother   . Hypertension Brother   . Other Daughter        positive for lynch syndrome  . Breast cancer Paternal Aunt 62  . Breast cancer Cousin    Social History   Socioeconomic History  . Marital status: Married    Spouse name: 96  . Number of children: 2  . Years of education: Not on file  . Highest education level: Associate degree: occupational, Brett [place], or vocational program  Occupational History  . Occupation: retired    Comment: Scientist, product/process development  . Occupation: Geologist, engineering  Tobacco Use  . Smoking status: Never Smoker  . Smokeless tobacco: Never Used  Vaping Use  . Vaping Use: Never used  Substance and Sexual Activity  . Alcohol use: No    Alcohol/week: 0.0 standard drinks  . Drug use: No  . Sexual activity: Yes    Partners: Female  Other Topics Concern  . 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 She watches them after school about twice a week   Husband is a Chartered loss adjuster, works on cars, but does not seem to care about their house   Social Determinants of Corporate investment banker Strain: Low Risk   . Difficulty of Paying Living Expenses: Not very hard  Food Insecurity: No Food Insecurity  . Worried About Programme researcher, broadcasting/film/video in the Last Year: Never true  . Ran Out of Food in the Last Year: Never true  Transportation Needs: No  Transportation Needs  . Lack of Transportation (Medical): No  . Lack of Transportation (Non-Medical): No  Physical Activity: Inactive  . Days of Exercise per Week: 0 days  . Minutes of Exercise per Session: 0 min  Stress: No Stress Concern Present  . Feeling of Stress : Only a little  Social Connections: Socially Integrated  . Frequency of Communication with Friends and Family: More than three times a week  . Frequency of Social Gatherings with Friends and Family: More than three times a week  . Attends Religious Services: More than 4 times per year  . Active Member of Clubs or Organizations: Yes  . Attends Banker Meetings: More than 4 times per year  . Marital Status: Married    Tobacco Counseling Counseling given: Not Answered   Clinical Intake:  Pre-visit preparation completed: Yes  Pain : No/denies pain     BMI - recorded: 28.25 Nutritional Status: BMI 25 -29 Overweight Nutritional Risks: None Diabetes: No  How often do you need to have someone help you when you read instructions, pamphlets, or other written materials from your doctor or pharmacy?: 1 - Never    Interpreter Needed?: No  Information entered by :: Reather Littler LPN   Activities of Daily Living In your present state of health, do you have any difficulty performing the following activities: 01/20/2020 11/05/2019  Hearing? Y N  Comment declines hearing aids -  Vision? N N  Difficulty concentrating or making decisions? N N  Walking or climbing stairs? N N  Dressing or bathing? N N  Doing errands, shopping? N N  Preparing Food and eating ? N -  Using the Toilet? N -  In the past six months, have you accidently leaked urine? N -  Do you have problems with loss of bowel control? N -  Managing your Medications? N -  Managing your Finances? N -  Housekeeping or managing your Housekeeping? N -  Some recent data might be hidden    Patient Care Team: Alba Cory, MD as PCP - General  (Family Medicine) Kieth Brightly, MD (General Surgery) Vita Erm, MD as Consulting Physician (Cardiology) Linus Salmons, MD (Otolaryngology) Theodore Demark, Alden Benjamin, MD as Referring Physician (Gastroenterology) Isla Pence, OD (Optometry)  Indicate any recent Medical Services you may have received from other than [facility] providers in the past year (date may be approximate).     Assessment:   This is a routine wellness examination for Embry.  Hearing/Vision screen  Hearing Screening   125Hz  250Hz  500Hz  1000Hz  2000Hz  3000Hz  4000Hz  6000Hz  8000Hz   Right ear:           Left ear:  Comments: Pt c/o mild hearing difficulty, hearing evaluation done at [facility] and has mild hearing loss and may eventually need hearing aids   Vision Screening Comments: Due for eye exam. Established at [facility]  Dietary issues and exercise activities discussed: Current Exercise Habits: The patient does not participate in regular exercise at present, Exercise limited by: None identified  Goals    . Weight (lb) < 140 lb (63.5 kg)     Pt states she would like to lose weight over the next year and increase physical activity to at least 3 days per week      Depression Screen PHQ 2/9 Scores 01/20/2020 11/05/2019 08/05/2019 02/05/2019 12/24/2018 08/26/2018 06/06/2018  PHQ - 2 Score 0 0 0 1 0 0 2  PHQ- 9 Score - 0 0 4 0 0 4    Fall Risk Fall Risk  01/20/2020 11/05/2019 08/05/2019 02/05/2019 08/26/2018  Falls in the past year? 0 0 0 0 0  Number falls in past yr: 0 0 0 0 0  Injury with Fall? 0 0 0 0 0  Risk for fall due to : No Fall Risks - - - -  Follow up Falls prevention discussed - - - -    Any stairs in or around the home? Yes  If so, are there any without handrails? Yes  - few steps outside  Home free of loose throw rugs in walkways, pet beds, electrical cords, etc? Yes  Adequate lighting in your home to reduce risk of falls? Yes   ASSISTIVE DEVICES UTILIZED TO PREVENT  FALLS:  Life alert? No  Use of a cane, walker or w/c? No  Grab bars in the bathroom? No  Shower chair or bench in shower? No  Elevated toilet seat or a handicapped toilet? No   TIMED UP AND GO:  Was the test performed? Yes .  Length of time to ambulate 10 feet: 5 sec.   Gait steady and fast without use of assistive device  Cognitive Function: Pt declined 6CIT for 2021 AWV        Immunizations Immunization History  Administered Date(s) Administered  . Fluad Quad(high Dose 65+) 02/05/2019  . Influenza,inj,Quad PF,6+ Mos 02/20/2013, 03/03/2014, 02/15/2015, 02/20/2017, 02/19/2018  . Influenza-Unspecified 03/03/2014  . PFIZER SARS-COV-2 Vaccination 06/19/2019, 07/16/2019  . Pneumococcal Conjugate-13 12/24/2018  . Pneumococcal Polysaccharide-23 01/20/2020  . Tdap 05/08/2010, 06/06/2018  . Zoster 02/15/2015  . Zoster Recombinat (Shingrix) 06/06/2018, 12/24/2018    TDAP status: Up to date   Flu vaccine status: pt to receive at local pharmacy or nurse visit, she does not wish to receive at the same time as pneumonia vaccine given today.   Pneumococcal vaccine status: Completed during today's visit.   Covid-19 vaccine status: Completed vaccines  Qualifies for Shingles Vaccine? Yes   Zostavax completed Yes   Shingrix Completed?: Yes  Screening Tests Health Maintenance  Topic Date Due  . INFLUENZA VACCINE  12/07/2019  . MAMMOGRAM  01/20/2021  . COLONOSCOPY  08/12/2026  . TETANUS/TDAP  06/06/2028  . DEXA SCAN  Completed  . COVID-19 Vaccine  Completed  . Hepatitis C Screening  Completed  . PNA vac Low Risk Adult  Completed    Health Maintenance  Health Maintenance Due  Topic Date Due  . INFLUENZA VACCINE  12/07/2019    Colorectal cancer screening: Completed 08/11/16. Repeat every 10 years   Mammogram status: Completed 01/21/19. Repeat every year   Bone Density status: Completed 01/21/19. Results reflect: Bone density results: OSTEOPENIA. Repeat  every 21  years.  Lung Cancer Screening: (Low Dose CT Chest recommended if Age 13-80 years, 30 pack-year currently smoking OR have quit w/in 15years.) does not qualify.   Additional Screening:  Hepatitis C Screening: does qualify; Completed 05/09/12  Vision Screening: Recommended annual ophthalmology exams for early detection of glaucoma and other disorders of the eye. Is the patient up to date with their annual eye exam?  No  Who is the provider or what is the name of the office in which the patient attends annual eye exams? [facility]  Dental Screening: Recommended annual dental exams for proper oral hygiene  Community Resource Referral / Chronic Care Management: CRR required this visit?  No   CCM required this visit?  No      Plan:     I have personally reviewed and noted the following in the patient's chart:   . Medical and social history . Use of alcohol, tobacco or illicit drugs  . Current medications and supplements . Functional ability and status . Nutritional status . Physical activity . Advanced directives . List of other physicians . Hospitalizations, surgeries, and ER visits in previous 12 months . Vitals . Screenings to include cognitive, depression, and falls . Referrals and appointments  In addition, I have reviewed and discussed with patient certain preventive protocols, quality metrics, and best practice recommendations. A written personalized care plan for preventive services as well as general preventive health recommendations were provided to patient.     Reather LittlerKasey Kacy Conely, LPN   9/14/78299/14/2021   Nurse Notes: none

## 2020-01-20 NOTE — Patient Instructions (Signed)
Jodi Fernandez , Thank you for taking time to come for your Medicare Wellness Visit. I appreciate your ongoing commitment to your health goals. Please review the following plan we discussed and let me know if I can assist you in the future.   Screening recommendations/referrals: Colonoscopy: done 08/11/16. Repeat in 2028 Mammogram: done 01/21/19. Please call [phone] to schedule your mammogram.  Bone Density: done 01/21/19 Recommended yearly ophthalmology/optometry visit for glaucoma screening and checkup Recommended yearly dental visit for hygiene and checkup  Vaccinations: Influenza vaccine: due Pneumococcal vaccine: done today  Tdap vaccine: done 06/06/18 Shingles vaccine: done 06/06/18 & 12/24/18   Covid-19:done 06/19/19 & 07/16/19  Advanced directives: Advance directive discussed with you today. I have provided a copy for you to complete at home and have notarized. Once this is complete please bring a copy in to our office so we can scan it into your chart.  Conditions/risks identified: Recommend increasing physical activity to at least 3 days per week  Next appointment: Follow up in one year for your annual wellness visit    Preventive Care 65 Years and Older, Female Preventive care refers to lifestyle choices and visits with your health care provider that can promote health and wellness. What does preventive care include?  A yearly physical exam. This is also called an annual well check.  Dental exams once or twice a year.  Routine eye exams. Ask your health care provider how often you should have your eyes checked.  Personal lifestyle choices, including:  Daily care of your teeth and gums.  Regular physical activity.  Eating a healthy diet.  Avoiding tobacco and drug use.  Limiting alcohol use.  Practicing safe sex.  Taking low-dose aspirin every day.  Taking vitamin and mineral supplements as recommended by your health care provider. What happens during an annual  well check? The services and screenings done by your health care provider during your annual well check will depend on your age, overall health, lifestyle risk factors, and family history of disease. Counseling  Your health care provider may ask you questions about your:  Alcohol use.  Tobacco use.  Drug use.  Emotional well-being.  Home and relationship well-being.  Sexual activity.  Eating habits.  History of falls.  Memory and ability to understand (cognition).  Work and work Astronomer.  Reproductive health. Screening  You may have the following tests or measurements:  Height, weight, and BMI.  Blood pressure.  Lipid and cholesterol levels. These may be checked every 5 years, or more frequently if you are over 27 years old.  Skin check.  Lung cancer screening. You may have this screening every year starting at age 24 if you have a 30-pack-year history of smoking and currently smoke or have quit within the past 15 years.  Fecal occult blood test (FOBT) of the stool. You may have this test every year starting at age 82.  Flexible sigmoidoscopy or colonoscopy. You may have a sigmoidoscopy every 5 years or a colonoscopy every 10 years starting at age 65.  Hepatitis C blood test.  Hepatitis B blood test.  Sexually transmitted disease (STD) testing.  Diabetes screening. This is done by checking your blood sugar (glucose) after you have not eaten for a while (fasting). You may have this done every 1-3 years.  Bone density scan. This is done to screen for osteoporosis. You may have this done starting at age 74.  Mammogram. This may be done every 1-2 years. Talk to your health care provider  about how often you should have regular mammograms. Talk with your health care provider about your test results, treatment options, and if necessary, the need for more tests. Vaccines  Your health care provider may recommend certain vaccines, such as:  Influenza vaccine. This  is recommended every year.  Tetanus, diphtheria, and acellular pertussis (Tdap, Td) vaccine. You may need a Td booster every 10 years.  Zoster vaccine. You may need this after age 12.  Pneumococcal 13-valent conjugate (PCV13) vaccine. One dose is recommended after age 16.  Pneumococcal polysaccharide (PPSV23) vaccine. One dose is recommended after age 5. Talk to your health care provider about which screenings and vaccines you need and how often you need them. This information is not intended to replace advice given to you by your health care provider. Make sure you discuss any questions you have with your health care provider. Document Released: 05/21/2015 Document Revised: 01/12/2016 Document Reviewed: 02/23/2015 Elsevier Interactive Patient Education  2017 [employer].  Fall Prevention in the Home Falls can cause injuries. They can happen to people of all ages. There are many things you can do to make your home safe and to help prevent falls. What can I do on the outside of my home?  Regularly fix the edges of walkways and driveways and fix any cracks.  Remove anything that might make you trip as you walk through a door, such as a raised step or threshold.  Trim any bushes or trees on the path to your home.  Use bright outdoor lighting.  Clear any walking paths of anything that might make someone trip, such as rocks or tools.  Regularly check to see if handrails are loose or broken. Make sure that both sides of any steps have handrails.  Any raised decks and porches should have guardrails on the edges.  Have any leaves, snow, or ice cleared regularly.  Use sand or salt on walking paths during winter.  Clean up any spills in your garage right away. This includes oil or grease spills. What can I do in the bathroom?  Use night lights.  Install grab bars by the toilet and in the tub and shower. Do not use towel bars as grab bars.  Use non-skid mats or decals in the tub or  shower.  If you need to sit down in the shower, use a plastic, non-slip stool.  Keep the floor dry. Clean up any water that spills on the floor as soon as it happens.  Remove soap buildup in the tub or shower regularly.  Attach bath mats securely with double-sided non-slip rug tape.  Do not have throw rugs and other things on the floor that can make you trip. What can I do in the bedroom?  Use night lights.  Make sure that you have a light by your bed that is easy to reach.  Do not use any sheets or blankets that are too big for your bed. They should not hang down onto the floor.  Have a firm chair that has side arms. You can use this for support while you get dressed.  Do not have throw rugs and other things on the floor that can make you trip. What can I do in the kitchen?  Clean up any spills right away.  Avoid walking on wet floors.  Keep items that you use a lot in easy-to-reach places.  If you need to reach something above you, use a strong step stool that has a grab bar.  Keep electrical cords out of the way.  Do not use floor polish or wax that makes floors slippery. If you must use wax, use non-skid floor wax.  Do not have throw rugs and other things on the floor that can make you trip. What can I do with my stairs?  Do not leave any items on the stairs.  Make sure that there are handrails on both sides of the stairs and use them. Fix handrails that are broken or loose. Make sure that handrails are as long as the stairways.  Check any carpeting to make sure that it is firmly attached to the stairs. Fix any carpet that is loose or worn.  Avoid having throw rugs at the top or bottom of the stairs. If you do have throw rugs, attach them to the floor with carpet tape.  Make sure that you have a light switch at the top of the stairs and the bottom of the stairs. If you do not have them, ask someone to add them for you. What else can I do to help prevent  falls?  Wear shoes that:  Do not have high heels.  Have rubber bottoms.  Are comfortable and fit you well.  Are closed at the toe. Do not wear sandals.  If you use a stepladder:  Make sure that it is fully opened. Do not climb a closed stepladder.  Make sure that both sides of the stepladder are locked into place.  Ask someone to hold it for you, if possible.  Clearly mark and make sure that you can see:  Any grab bars or handrails.  First and last steps.  Where the edge of each step is.  Use tools that help you move around (mobility aids) if they are needed. These include:  Canes.  Walkers.  Scooters.  Crutches.  Turn on the lights when you go into a dark area. Replace any light bulbs as soon as they burn out.  Set up your furniture so you have a clear path. Avoid moving your furniture around.  If any of your floors are uneven, fix them.  If there are any pets around you, be aware of where they are.  Review your medicines with your doctor. Some medicines can make you feel dizzy. This can increase your chance of falling. Ask your doctor what other things that you can do to help prevent falls. This information is not intended to replace advice given to you by your health care provider. Make sure you discuss any questions you have with your health care provider. Document Released: 02/18/2009 Document Revised: 09/30/2015 Document Reviewed: 05/29/2014 Elsevier Interactive Patient Education  2017 [employer] Inc[employer]  Pneumococcal Polysaccharide Vaccine (PPSV23): What You Need to Know 1. Why get vaccinated? Pneumococcal polysaccharide vaccine (PPSV23) can prevent pneumococcal disease. Pneumococcal disease refers to any illness caused by pneumococcal bacteria. These bacteria can cause many types of illnesses, including pneumonia, which is an infection of the lungs. Pneumococcal bacteria are one of the most common causes of pneumonia. Besides pneumonia, pneumococcal  bacteria can also cause:  Ear infections  Sinus infections  Meningitis (infection of the tissue covering the brain and spinal cord)  Bacteremia (bloodstream infection) Anyone can get pneumococcal disease, but children under 72 years of age, people with certain medical conditions, adults 65 years or older, and cigarette smokers are at the highest risk. Most pneumococcal infections are mild. However, some can result in long-term problems, such as brain damage or hearing loss. Meningitis, bacteremia, and  pneumonia caused by pneumococcal disease can be fatal. 2. PPSV23 PPSV23 protects against 23 types of bacteria that cause pneumococcal disease. PPSV23 is recommended for:  All adults 65 years or older,  Anyone 2 years or older with certain medical conditions that can lead to an increased risk for pneumococcal disease. Most people need only one dose of PPSV23. A second dose of PPSV23, and another type of pneumococcal vaccine called PCV13, are recommended for certain high-risk groups. Your health care provider can give you more information. People 65 years or older should get a dose of PPSV23 even if they have already gotten one or more doses of the vaccine before they turned 3465. 3. Talk with your health care provider Tell your vaccine provider if the person getting the vaccine:  Has had an allergic reaction after a previous dose of PPSV23, or has any severe, life-threatening allergies. In some cases, your health care provider may decide to postpone PPSV23 vaccination to a future visit. People with minor illnesses, such as a cold, may be vaccinated. People who are moderately or severely ill should usually wait until they recover before getting PPSV23. Your health care provider can give you more information. 4. Risks of a vaccine reaction  Redness or pain where the shot is given, feeling tired, fever, or muscle aches can happen after PPSV23. People sometimes faint after medical procedures,  including vaccination. Tell your provider if you feel dizzy or have vision changes or ringing in the ears. As with any medicine, there is a very remote chance of a vaccine causing a severe allergic reaction, other serious injury, or death. 5. What if there is a serious problem? An allergic reaction could occur after the vaccinated person leaves the clinic. If you see signs of a severe allergic reaction (hives, swelling of the face and throat, difficulty breathing, a fast heartbeat, dizziness, or weakness), call 9-1-1 and get the person to the nearest hospital. For other signs that concern you, call your health care provider. Adverse reactions should be reported to the Vaccine Adverse Event Reporting System (VAERS). Your health care provider will usually file this report, or you can do it yourself. Visit the VAERS website at www.vaers.LAgents.nohhs.gov or call 608-488-00821-(778)804-7445. VAERS is only for reporting reactions, and VAERS staff do not give medical advice. 6. How can I learn more?  Ask your health care provider.  Call your local or state health department.  Contact the Centers for Disease Control and Prevention (CDC): ? Call 419 388 95281-919-083-1549 (1-800-CDC-INFO) or ? Visit CDC's website at PicCapture.uywww.cdc.gov/vaccines CDC Vaccine Information Statement PPSV23 Vaccine (03/06/2018) This information is not intended to replace advice given to you by your health care provider. Make sure you discuss any questions you have with your health care provider. Document Revised: 08/13/2018 Document Reviewed: 12/04/2017 Elsevier Patient Education  2020 [employer]

## 2020-03-30 ENCOUNTER — Ambulatory Visit
Admission: RE | Admit: 2020-03-30 | Discharge: 2020-03-30 | Disposition: A | Discharge status: Home / Self Care | Payer: Medicare HMO | Source: Ambulatory Visit | Attending: Family Medicine | Admitting: Family Medicine

## 2020-03-30 ENCOUNTER — Other Ambulatory Visit: Payer: Self-pay

## 2020-03-30 DIAGNOSIS — Z1231 Encounter for screening mammogram for malignant neoplasm of breast: Secondary | ICD-10-CM

## 2020-05-03 NOTE — Progress Notes (Signed)
Name: Jodi Fernandez   MRN: [id number]    DOB: [birth year]   Date:05/10/2020       Progress Note  Subjective  Chief Complaint  Follow up  HPI  HTNwith white coat hypertension:: bp at home has been122-132/77-82 , she only takes hydralazine before coming in because of white coat hypertension and today bp is under controlShe denies chest pain or palpitation lately. She has a history of paroxysmal tachycardia, but no problems since.   Pre-diabetes: last hgbA1C was 5.8% and stable She denies polyphagia, polydipsia or polyuria.  Hyperlipidemia: on statin therapy, denies myalgias. She takes medication a few times a week., ASCVD is high but unable to take statins more often because it cause joint pains.  The 10-year ASCVD risk score Denman George DC Montez Hageman., et al., 2013) is: 9.8%   Values used to calculate the score:     Age: 64 years     Sex: Female     Is Non-Hispanic African American: No     Diabetic: No     Tobacco smoker: No     Systolic Blood Pressure: 132 mmHg     Is BP treated: Yes     HDL Cholesterol: 51 mg/dL     Total Cholesterol: 236 mg/dL  GAD and dysthymia: there is a family history of depression ( mother and sister) she is worried about her husband that had a stent placed in Feb and had TIA Dec 2021  has chronic back pain and DM.    Patient Active Problem List   Diagnosis Date Noted  . White coat syndrome with diagnosis of hypertension 02/05/2019  . Osteopenia after menopause 01/22/2019  . 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 Bilateral tinnitus 11/14/2017  . Tachycardia, paroxysmal (HCC) 07/25/2016  . GAD (generalized anxiety disorder) 11/11/2014  . Insomnia, persistent 11/11/2014  . Dyspareunia 11/11/2014  . Generalized headache 11/11/2014  . Bursitis, trochanteric 11/11/2014  . History of [place] spotted fever 04/11/2014  . Dyslipidemia 10/29/2006  . Benign hypertension 10/29/2006  . Allergic rhinitis 10/29/2006     Past Surgical History:  Procedure Laterality Date  . COLONOSCOPY    . TUBAL LIGATION  1987    Family History  Problem Relation Age of Onset  . Heart disease Mother   . Diabetes Mother   . Alcohol abuse Father   . Heart disease Father   . Heart disease Sister   . Hypertension Brother   . Hypertension Brother   . Hypertension Brother   . Hypertension Brother   . Other Daughter        positive for lynch syndrome  . Breast cancer Paternal Aunt 32  . Breast cancer Cousin     Social History   Tobacco Use  . Smoking status: Never Smoker  . Smokeless tobacco: Never Used  Substance Use Topics  . Alcohol use: No    Alcohol/week: 0.0 standard drinks     Current Outpatient Medications:  .  acetaminophen (TYLENOL) 650 MG CR tablet, Take 1 tablet (650 mg total) by mouth as needed for pain., Disp: 30 tablet, Rfl: 0 .  Coenzyme Q10 (CO Q 10 PO), Take 300 mg by mouth daily., Disp: , Rfl:  .  hydrALAZINE (APRESOLINE) 10 MG tablet, Take 1 tablet (10 mg total) by mouth 3 (three) times daily. Prn bp above 150/90, Disp: 30 tablet, Rfl: 0 .  ibuprofen (ADVIL,MOTRIN) 200 MG tablet, Take 200 mg by mouth every 6 (six) hours  as needed. Reported on 11/18/2015, Disp: , Rfl:  .  OVER THE COUNTER MEDICATION, Immunity support vitamin with elderberry, zinc, vitamin C, Disp: , Rfl:  .  rosuvastatin (CRESTOR) 5 MG tablet, Take 1 tablet (5 mg total) by mouth 3 (three) times a week., Disp: 36 tablet, Rfl: 1 .  telmisartan-hydrochlorothiazide (MICARDIS HCT) 40-12.5 MG tablet, Take 1 tablet by mouth daily., Disp: 90 tablet, Rfl: 0  No Known Allergies  I personally reviewed active problem list, medication list, allergies, family history, social history, health maintenance with the patient/caregiver today.   ROS  Constitutional: Negative for fever or weight change.  Respiratory: Negative for cough and shortness of breath.   Cardiovascular: Negative for chest pain or palpitations.  Gastrointestinal:  Negative for abdominal pain, no bowel changes.  Musculoskeletal: Negative for gait problem or joint swelling.  Skin: Negative for rash.  Neurological: Negative for dizziness or headache.  No other specific complaints in a complete review of systems (except as listed in HPI above).  Objective  Vitals:   05/10/20 1008  BP: 132/80  Pulse: 86  Resp: 16  Temp: 97.9 F (36.6 C)  TempSrc: Oral  SpO2: 97%  Weight: 151 lb 8 oz (68.7 kg)  Height: 5\' 1"  (1.549 m)    Body mass index is 28.63 kg/m.  Physical Exam  Constitutional: Patient appears well-developed and well-nourished.  No distress.  HEENT: head atraumatic, normocephalic, pupils equal and reactive to light,  neck supple Cardiovascular: Normal rate, regular rhythm and normal heart sounds.  No murmur heard. No BLE edema. Pulmonary/Chest: Effort normal and breath sounds normal. No respiratory distress. Abdominal: Soft.  There is no tenderness. Psychiatric: Patient has a normal mood and affect. behavior is normal. Judgment and thought content normal.  PHQ2/9: Depression screen [facility] 2/9 05/10/2020 01/20/2020 11/05/2019 08/05/2019 02/05/2019  Decreased Interest 0 0 0 0 0  Down, Depressed, Hopeless 0 0 0 0 1  PHQ - 2 Score 0 0 0 0 1  Altered sleeping 1 - 0 0 1  Tired, decreased energy 0 - 0 0 1  Change in appetite 1 - 0 0 0  Feeling bad or failure about yourself  0 - 0 0 0  Trouble concentrating 0 - 0 0 1  Moving slowly or fidgety/restless 0 - 0 0 0  Suicidal thoughts 0 - 0 0 0  PHQ-9 Score 2 - 0 0 4  Difficult doing work/chores Not difficult at all - - - -  Some recent data might be hidden    phq 9 is negative   Fall Risk: Fall Risk  05/10/2020 05/10/2020 01/20/2020 11/05/2019 08/05/2019  Falls in the past year? 0 0 0 0 0  Number falls in past yr: 0 0 0 0 0  Injury with Fall? 0 0 0 0 0  Risk for fall due to : - - No Fall Risks - -  Follow up - - Falls prevention discussed - -     Functional Status Survey: Is the patient deaf  or have difficulty hearing?: Yes Does the patient have difficulty seeing, even when wearing glasses/contacts?: No Does the patient have difficulty concentrating, remembering, or making decisions?: No Does the patient have difficulty walking or climbing stairs?: No Does the patient have difficulty dressing or bathing?: No Does the patient have difficulty doing errands alone such as visiting a doctor's office or shopping?: No    Assessment & Plan  1. White coat syndrome with diagnosis of hypertension  Doing well   2.  Need for immunization against influenza  - Flu Vaccine QUAD High Dose(Fluad)  3. GAD (generalized anxiety disorder)  Stable   4. Pre-diabetes  On life style modification only   5. Dyslipidemia  Taking it three days a week, advised to try taking it a little more often   6. Tachycardia, paroxysmal (HCC)  No recent episodes

## 2020-05-10 ENCOUNTER — Encounter: Payer: Self-pay | Admitting: Family Medicine

## 2020-05-10 ENCOUNTER — Other Ambulatory Visit: Payer: Self-pay

## 2020-05-10 ENCOUNTER — Ambulatory Visit (INDEPENDENT_AMBULATORY_CARE_PROVIDER_SITE_OTHER): Payer: Medicare HMO | Admitting: Family Medicine

## 2020-05-10 VITALS — BP 132/80 | HR 86 | Temp 97.9°F | Resp 16 | Ht 61.0 in | Wt 151.5 lb

## 2020-05-10 DIAGNOSIS — E785 Hyperlipidemia, unspecified: Secondary | ICD-10-CM | POA: Diagnosis not present

## 2020-05-10 DIAGNOSIS — I479 Paroxysmal tachycardia, unspecified: Secondary | ICD-10-CM

## 2020-05-10 DIAGNOSIS — Z23 Encounter for immunization: Secondary | ICD-10-CM

## 2020-05-10 DIAGNOSIS — R7303 Prediabetes: Secondary | ICD-10-CM

## 2020-05-10 DIAGNOSIS — I10 Essential (primary) hypertension: Secondary | ICD-10-CM

## 2020-05-10 DIAGNOSIS — F411 Generalized anxiety disorder: Secondary | ICD-10-CM

## 2020-05-10 DIAGNOSIS — R69 Illness, unspecified: Secondary | ICD-10-CM | POA: Diagnosis not present

## 2020-06-04 ENCOUNTER — Other Ambulatory Visit: Payer: Self-pay | Admitting: Family Medicine

## 2020-06-04 DIAGNOSIS — I10 Essential (primary) hypertension: Secondary | ICD-10-CM

## 2020-10-29 ENCOUNTER — Other Ambulatory Visit: Payer: Self-pay | Admitting: Family Medicine

## 2020-10-29 DIAGNOSIS — I10 Essential (primary) hypertension: Secondary | ICD-10-CM

## 2020-11-09 ENCOUNTER — Encounter: Payer: Medicare HMO | Admitting: Family Medicine

## 2020-11-15 NOTE — Patient Instructions (Signed)
Preventive Care 67 Years and Older, Female Preventive care refers to lifestyle choices and visits with your health care provider that can promote health and wellness. This includes: A yearly physical exam. This is also called an annual wellness visit. Regular dental and eye exams. Immunizations. Screening for certain conditions. Healthy lifestyle choices, such as: Eating a healthy diet. Getting regular exercise. Not using drugs or products that contain nicotine and tobacco. Limiting alcohol use. What can I expect for my preventive care visit? Physical exam Your health care provider will check your: Height and weight. These may be used to calculate your BMI (body mass index). BMI is a measurement that tells if you are at a healthy weight. Heart rate and blood pressure. Body temperature. Skin for abnormal spots. Counseling Your health care provider may ask you questions about your: Past medical problems. Family's medical history. Alcohol, tobacco, and drug use. Emotional well-being. Home life and relationship well-being. Sexual activity. Diet, exercise, and sleep habits. History of falls. Memory and ability to understand (cognition). Work and work Statistician. Pregnancy and menstrual history. Access to firearms. What immunizations do I need?  Vaccines are usually given at various ages, according to a schedule. Your health care provider will recommend vaccines for you based on your age, medicalhistory, and lifestyle or other factors, such as travel or where you work. What tests do I need? Blood tests Lipid and cholesterol levels. These may be checked every 5 years, or more often depending on your overall health. Hepatitis C test. Hepatitis B test. Screening Lung cancer screening. You may have this screening every year starting at age 44 if you have a 30-pack-year history of smoking and currently smoke or have quit within the past 15 years. Colorectal cancer screening. All  adults should have this screening starting at age 39 and continuing until age 65. Your health care provider may recommend screening at age 61 if you are at increased risk. You will have tests every 1-10 years, depending on your results and the type of screening test. Diabetes screening. This is done by checking your blood sugar (glucose) after you have not eaten for a while (fasting). You may have this done every 1-3 years. Mammogram. This may be done every 1-2 years. Talk with your health care provider about how often you should have regular mammograms. Abdominal aortic aneurysm (AAA) screening. You may need this if you are a current or former smoker. BRCA-related cancer screening. This may be done if you have a family history of breast, ovarian, tubal, or peritoneal cancers. Other tests STD (sexually transmitted disease) testing, if you are at risk. Bone density scan. This is done to screen for osteoporosis. You may have this done starting at age 54. Talk with your health care provider about your test results, treatment options,and if necessary, the need for more tests. Follow these instructions at home: Eating and drinking  Eat a diet that includes fresh fruits and vegetables, whole grains, lean protein, and low-fat dairy products. Limit your intake of foods with high amounts of sugar, saturated fats, and salt. Take vitamin and mineral supplements as recommended by your health care provider. Do not drink alcohol if your health care provider tells you not to drink. If you drink alcohol: Limit how much you have to 0-1 drink a day. Be aware of how much alcohol is in your drink. In the [place] one drink equals one 12 oz bottle of beer (355 mL), one 5 oz glass of wine (148 mL), or one 1  oz glass of hard liquor (44 mL).  Lifestyle Take daily care of your teeth and gums. Brush your teeth every morning and night with fluoride toothpaste. Floss one time each day. Stay active. Exercise for at  least 30 minutes 5 or more days each week. Do not use any products that contain nicotine or tobacco, such as cigarettes, e-cigarettes, and chewing tobacco. If you need help quitting, ask your health care provider. Do not use drugs. If you are sexually active, practice safe sex. Use a condom or other form of protection in order to prevent STIs (sexually transmitted infections). Talk with your health care provider about taking a low-dose aspirin or statin. Find healthy ways to cope with stress, such as: Meditation, yoga, or listening to music. Journaling. Talking to a trusted person. Spending time with friends and family. Safety Always wear your seat belt while driving or riding in a vehicle. Do not drive: If you have been drinking alcohol. Do not ride with someone who has been drinking. When you are tired or distracted. While texting. Wear a helmet and other protective equipment during sports activities. If you have firearms in your house, make sure you follow all gun safety procedures. What's next? Visit your health care provider once a year for an annual wellness visit. Ask your health care provider how often you should have your eyes and teeth checked. Stay up to date on all vaccines. This information is not intended to replace advice given to you by your health care provider. Make sure you discuss any questions you have with your healthcare provider. Document Revised: 04/14/2020 Document Reviewed: 04/18/2018 Elsevier Patient Education  2022 [employer].

## 2020-11-15 NOTE — Progress Notes (Signed)
Name: Jodi Fernandez   MRN: [id number]    DOB: [birth year]   Date:11/16/2020       Progress Note  Subjective  Chief Complaint  Annual Exam  HPI  Patient presents for annual CPE and follow up - patient is aware may have extra cost    HTN with white coat hypertension: : bp at home has been 120's/60's-70's she only takes hydralazine before coming to doctor's visit since she has  white coat hypertension and today bp is under control She denies chest pain or palpitation lately. She has a history of paroxysmal tachycardia, but no problems since.     Pre-diabetes: last hgbA1C was 5.8% and stable She denies polyphagia, polydipsia or polyuria. She is trying to avoid sweets    Hyperlipidemia: on statin therapy , he takes medication a few times a week., ASCVD is high but unable to take statins more often because it cause joint pains.  The 10-year ASCVD risk score Mikey Bussing DC Brooke Bonito., et al., 2013) is: 9.8%   Values used to calculate the score:     Age: 65 years     Sex: Female     Is Non-Hispanic African American: No     Diabetic: No     Tobacco smoker: No     Systolic Blood Pressure: 638 mmHg     Is BP treated: Yes     HDL Cholesterol: 51 mg/dL     Total Cholesterol: 236 mg/dL    GAD and dysthymia: there is a family history of depression ( mother and sister) she is worried about her husband that had a stent placed in Feb and had TIA Dec 2021  has chronic back pain and DM, he is doing better now but still has daily pain .    Diet: balanced Exercise: discussed 150 minutes per week    [place] Office Visit from 05/10/2020 in [facility]  AUDIT-C Score 0      Depression: Phq 9 is  negative Depression screen [facility] 2/9 11/16/2020 05/10/2020 01/20/2020 11/05/2019 08/05/2019  Decreased Interest 0 0 0 0 0  Down, Depressed, Hopeless 0 0 0 0 0  PHQ - 2 Score 0 0 0 0 0  Altered sleeping - 1 - 0 0  Tired, decreased energy - 0 - 0 0  Change in appetite - 1 - 0 0  Feeling bad or failure  about yourself  - 0 - 0 0  Trouble concentrating - 0 - 0 0  Moving slowly or fidgety/restless - 0 - 0 0  Suicidal thoughts - 0 - 0 0  PHQ-9 Score - 2 - 0 0  Difficult doing work/chores - Not difficult at all - - -  Some recent data might be hidden   Hypertension: BP Readings from Last 3 Encounters:  11/16/20 132/84  05/10/20 132/80  01/20/20 138/82   Obesity: Wt Readings from Last 3 Encounters:  11/16/20 148 lb (67.1 kg)  05/10/20 151 lb 8 oz (68.7 kg)  01/20/20 149 lb 8 oz (67.8 kg)   BMI Readings from Last 3 Encounters:  11/16/20 27.96 kg/m  05/10/20 28.63 kg/m  01/20/20 28.25 kg/m     Vaccines:  HPV: up to at age 62 , ask insurance if age between 24-45  Shingrix: 14-64 yo and ask insurance if covered when patient above 61 yo Pneumonia: educated and discussed with patient. Flu: educated and discussed with patient.  Hep C Screening: 05/09/12 STD testing and prevention (HIV/chl/gon/syphilis): N/A Intimate partner violence:  negative Sexual History : not currently - he has health issues  Menstrual History/LMP/Abnormal Bleeding: discussed post menopausal bleeding  Incontinence Symptoms: she has mild stress incontinence when she has coughing spells only - discussed kegel exercises   Breast cancer:  - Last Mammogram: 03/30/20 - BRCA gene screening: two daughter's have Lynch Syndrome - her husbands family has a lot of cancer, not her family, she does not want to have genetic testing   Osteoporosis: Discussed high calcium and vitamin D supplementation, weight bearing exercises  Cervical cancer screening: N/A  Skin cancer: Discussed monitoring for atypical lesions  Colorectal cancer: 08/11/16   Lung cancer:  Low Dose CT Chest recommended if Age 56-80 years, 20 pack-year currently smoking OR have quit w/in 15years. Patient does not qualify.   ECG: 12/24/18  Advanced Care Planning: A voluntary discussion about advance care planning including the explanation and discussion of  advance directives.  Discussed health care proxy and Living will, and the patient was able to identify a health care proxy as husband.  Patient does not have a living will at present time.   Lipids: Lab Results  Component Value Date   CHOL 236 (H) 11/05/2019   CHOL 212 (H) 12/24/2018   CHOL 212 (H) 11/22/2017   Lab Results  Component Value Date   HDL 51 11/05/2019   HDL 45 (L) 12/24/2018   HDL 57 11/22/2017   Lab Results  Component Value Date   LDLCALC 152 (H) 11/05/2019   LDLCALC 128 (H) 12/24/2018   LDLCALC 135 (H) 11/22/2017   Lab Results  Component Value Date   TRIG 189 (H) 11/05/2019   TRIG 240 (H) 12/24/2018   TRIG 101 11/22/2017   Lab Results  Component Value Date   CHOLHDL 4.6 11/05/2019   CHOLHDL 4.7 12/24/2018   CHOLHDL 3.7 11/22/2017   No results found for: LDLDIRECT  Glucose: Glucose, Bld  Date Value Ref Range Status  11/05/2019 111 (H) 65 - 99 mg/dL Final    Comment:    .            Fasting reference interval . For someone without known diabetes, a glucose value between 100 and 125 mg/dL is consistent with prediabetes and should be confirmed with a follow-up test. .   12/24/2018 112 (H) 65 - 99 mg/dL Final    Comment:    .            Fasting reference interval . For someone without known diabetes, a glucose value between 100 and 125 mg/dL is consistent with prediabetes and should be confirmed with a follow-up test. .   11/22/2017 103 (H) 65 - 99 mg/dL Final    Comment:    .            Fasting reference interval . For someone without known diabetes, a glucose value between 100 and 125 mg/dL is consistent with prediabetes and should be confirmed with a follow-up test. .     Patient Active Problem List   Diagnosis Date Noted   White coat syndrome with diagnosis of hypertension 02/05/2019   Osteopenia after menopause 01/22/2019   Conductive hearing loss 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 11/14/2017   Bilateral  tinnitus 11/14/2017   Tachycardia, paroxysmal ([place]) 07/25/2016   GAD (generalized anxiety disorder) 11/11/2014   Insomnia, persistent 11/11/2014   Dyspareunia 11/11/2014   Generalized headache 11/11/2014   Bursitis, trochanteric 11/11/2014   History of Rocky [place] spotted fever 04/11/2014   Dyslipidemia  10/29/2006   Benign hypertension 10/29/2006   Allergic rhinitis 10/29/2006    Past Surgical History:  Procedure Laterality Date   COLONOSCOPY     TUBAL LIGATION  1987    Family History  Problem Relation Age of Onset   Heart disease Mother    Diabetes Mother    Alcohol abuse Father    Heart disease Father    Heart disease Sister    Hypertension Brother    Hypertension Brother    Hypertension Brother    Hypertension Brother    Other Daughter        positive for lynch syndrome   Breast cancer Paternal Aunt 80   Breast cancer Cousin     Social History   Socioeconomic History   Marital status: Married    Spouse name: Richardson Landry   Number of children: 2   Years of education: Not on file   Highest education level: Associate degree: occupational, Hotel manager, or vocational program  Occupational History   Occupation: retired    Comment: Control and instrumentation engineer   Occupation: Oceanographer  Tobacco Use   Smoking status: Never   Smokeless tobacco: Never  Scientific laboratory technician Use: Never used  Substance and Sexual Activity   Alcohol use: No    Alcohol/week: 0.0 standard drinks   Drug use: No   Sexual activity: Yes    Partners: Female  Other Topics Concern   Not on file  Social History Narrative   Married   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 She watches them after school about twice a week   Husband is a Ship broker, works on cars, but does not seem to care about their house   Social Determinants of Adult nurse Strain: Low Risk    Difficulty of Paying Living Expenses: Not hard at all  Food Insecurity: No Food Insecurity   Worried About Charity fundraiser in the Last Year: Never true   Arboriculturist in the Last Year: Never true  Transportation Needs: No Transportation Needs   Lack of Transportation (Medical): No   Lack of Transportation (Non-Medical): No  Physical Activity: Insufficiently Active   Days of Exercise per Week: 2 days   Minutes of Exercise per Session: 20 min  Stress: No Stress Concern Present   Feeling of Stress : Only a little  Social Connections: Engineer, building services of Communication with Friends and Family: More than three times a week   Frequency of Social Gatherings with Friends and Family: More than three times a week   Attends Religious Services: More than 4 times per year   Active Member of [employer] or Organizations: Yes   Attends Music therapist: More than 4 times per year   Marital Status: Married  Human resources officer Violence: Not At Risk   Fear of Current or Ex-Partner: No   Emotionally Abused: No   Physically Abused: No   Sexually Abused: No     Current Outpatient Medications:    acetaminophen (TYLENOL) 650 MG CR tablet, Take 1 tablet (650 mg total) by mouth as needed for pain., Disp: 30 tablet, Rfl: 0   Coenzyme Q10 (CO Q 10 PO), Take 300 mg by mouth daily., Disp: , Rfl:    hydrALAZINE (APRESOLINE) 10 MG tablet, Take 1 tablet (  10 mg total) by mouth 3 (three) times daily. Prn bp above 150/90, Disp: 30 tablet, Rfl: 0   ibuprofen (ADVIL,MOTRIN) 200 MG tablet, Take 200 mg by mouth every 6 (six) hours as needed. Reported on 11/18/2015, Disp: , Rfl:    OVER THE COUNTER MEDICATION, Immunity support vitamin with elderberry, zinc, vitamin C, Disp: , Rfl:    rosuvastatin (CRESTOR) 5 MG tablet, Take 1 tablet (5 mg total) by mouth 3 (three) times a week., Disp: 36 tablet, Rfl: 1   telmisartan-hydrochlorothiazide (MICARDIS HCT)  40-12.5 MG tablet, TAKE 1 TABLET BY MOUTH ONCE DAILY, Disp: 30 tablet, Rfl: 0  No Known Allergies   ROS  Constitutional: Negative for fever or weight change.  Respiratory: Negative for cough and shortness of breath.   Cardiovascular: Negative for chest pain or palpitations.  Gastrointestinal: Negative for abdominal pain, no bowel changes.  Musculoskeletal: Negative for gait problem or joint swelling.  Skin: Negative for rash.  Neurological: Negative for dizziness or headache.  No other specific complaints in a complete review of systems (except as listed in HPI above).   Objective  Vitals:   11/16/20 0755  BP: 132/84  Pulse: 84  Resp: 16  Temp: 98 F (36.7 C)  TempSrc: Oral  SpO2: 98%  Weight: 148 lb (67.1 kg)  Height: 5' 1" (1.549 m)    Body mass index is 27.96 kg/m.  Physical Exam  Constitutional: Patient appears well-developed and well-nourished. No distress.  HENT: Head: Normocephalic and atraumatic. Ears: B TMs ok, no erythema or effusion; Nose: Nose normal. Mouth/Throat: Oropharynx is clear and moist. No oropharyngeal exudate.  Eyes: Conjunctivae and EOM are normal. Pupils are equal, round, and reactive to light. No scleral icterus.  Neck: Normal range of motion. Neck supple. No JVD present. No thyromegaly present.  Cardiovascular: Normal rate, regular rhythm and normal heart sounds.  No murmur heard. No BLE edema. Pulmonary/Chest: Effort normal and breath sounds normal. No respiratory distress. Abdominal: Soft. Bowel sounds are normal, no distension. There is no tenderness. no masses Breast: no lumps or masses, no nipple discharge or rashes FEMALE GENITALIA: not done  RECTAL: not done  Musculoskeletal: Normal range of motion, no joint effusions. No gross deformities Neurological: he is alert and oriented to person, place, and time. No cranial nerve deficit. Coordination, balance, strength, speech and gait are normal.  Skin: Skin is warm and dry. No rash noted.  No erythema.  Psychiatric: Patient has a normal mood and affect. behavior is normal. Judgment and thought content normal.   Fall Risk: Fall Risk  11/16/2020 05/10/2020 05/10/2020 01/20/2020 11/05/2019  Falls in the past year? 0 0 0 0 0  Number falls in past yr: 0 0 0 0 0  Injury with Fall? 0 0 0 0 0  Risk for fall due to : - - - No Fall Risks -  Follow up - - - Falls prevention discussed -     Functional Status Survey: Is the patient deaf or have difficulty hearing?: No Does the patient have difficulty seeing, even when wearing glasses/contacts?: No Does the patient have difficulty concentrating, remembering, or making decisions?: No Does the patient have difficulty walking or climbing stairs?: No Does the patient have difficulty dressing or bathing?: No Does the patient have difficulty doing errands alone such as visiting a doctor's office or shopping?: No   Assessment & Plan  1. Well adult exam  - COMPLETE METABOLIC PANEL WITH GFR - CBC with Differential/Platelet - Lipid panel - Hemoglobin A1c  2. White coat syndrome with diagnosis of hypertension  - telmisartan-hydrochlorothiazide (MICARDIS HCT) 40-12.5 MG tablet; Take 1 tablet by mouth daily.  Dispense: 90 tablet; Refill: 1 - COMPLETE METABOLIC PANEL WITH GFR - CBC with Differential/Platelet  3. Dyslipidemia  - rosuvastatin (CRESTOR) 5 MG tablet; Take 1 tablet (5 mg total) by mouth 3 (three) times a week.  Dispense: 36 tablet; Refill: 1 - Lipid panel  4. Pre-diabetes  - Hemoglobin A1c  5. GAD (generalized anxiety disorder)   6. Hyperglycemia  - Hemoglobin A1c   -USPSTF grade A and B recommendations reviewed with patient; age-appropriate recommendations, preventive care, screening tests, etc discussed and encouraged; healthy living encouraged; see AVS for patient education given to patient -Discussed importance of 150 minutes of physical activity weekly, eat two servings of fish weekly, eat one serving of tree nuts (  cashews, pistachios, pecans, almonds.Marland Kitchen) every other day, eat 6 servings of fruit/vegetables daily and drink plenty of water and avoid sweet beverages.

## 2020-11-16 ENCOUNTER — Ambulatory Visit (INDEPENDENT_AMBULATORY_CARE_PROVIDER_SITE_OTHER): Payer: Medicare HMO | Admitting: Family Medicine

## 2020-11-16 ENCOUNTER — Other Ambulatory Visit: Payer: Self-pay

## 2020-11-16 ENCOUNTER — Encounter: Payer: Self-pay | Admitting: Family Medicine

## 2020-11-16 DIAGNOSIS — E785 Hyperlipidemia, unspecified: Secondary | ICD-10-CM

## 2020-11-16 DIAGNOSIS — R739 Hyperglycemia, unspecified: Secondary | ICD-10-CM | POA: Diagnosis not present

## 2020-11-16 DIAGNOSIS — R69 Illness, unspecified: Secondary | ICD-10-CM | POA: Diagnosis not present

## 2020-11-16 DIAGNOSIS — R7303 Prediabetes: Secondary | ICD-10-CM | POA: Diagnosis not present

## 2020-11-16 DIAGNOSIS — F411 Generalized anxiety disorder: Secondary | ICD-10-CM

## 2020-11-16 DIAGNOSIS — Z0000 Encounter for general adult medical examination without abnormal findings: Secondary | ICD-10-CM | POA: Diagnosis not present

## 2020-11-16 DIAGNOSIS — I10 Essential (primary) hypertension: Secondary | ICD-10-CM

## 2020-11-16 MED ORDER — ROSUVASTATIN CALCIUM 5 MG PO TABS: 5.0000 mg | ORAL_TABLET | ORAL | 1 refills | Status: DC

## 2020-11-16 MED ORDER — TELMISARTAN-HCTZ 40-12.5 MG PO TABS: 1.0000 | ORAL_TABLET | Freq: Every day | ORAL | 1 refills | Status: DC

## 2020-11-17 LAB — CBC WITH DIFFERENTIAL/PLATELET
Absolute Monocytes: 533 cells/uL (ref 200–950)
Basophils Absolute: 43 cells/uL (ref 0–200)
Basophils Relative: 0.6 %
Eosinophils Absolute: 249 cells/uL (ref 15–500)
Eosinophils Relative: 3.5 %
HCT: 40.2 % (ref 35.0–45.0)
Hemoglobin: 13.8 g/dL (ref 11.7–15.5)
Lymphs Abs: 1938 cells/uL (ref 850–3900)
MCH: 32.7 pg (ref 27.0–33.0)
MCHC: 34.3 g/dL (ref 32.0–36.0)
MCV: 95.3 fL (ref 80.0–100.0)
MPV: 9.4 fL (ref 7.5–12.5)
Monocytes Relative: 7.5 %
Neutro Abs: 4338 cells/uL (ref 1500–7800)
Neutrophils Relative %: 61.1 %
Platelets: 340 10*3/uL (ref 140–400)
RBC: 4.22 10*6/uL (ref 3.80–5.10)
RDW: 12.1 % (ref 11.0–15.0)
Total Lymphocyte: 27.3 %
WBC: 7.1 10*3/uL (ref 3.8–10.8)

## 2020-11-17 LAB — HEMOGLOBIN A1C
Hgb A1c MFr Bld: 5.7 % of total Hgb — ABNORMAL HIGH (ref <5.7)
Mean Plasma Glucose: 117 mg/dL
eAG (mmol/L): 6.5 mmol/L

## 2020-11-17 LAB — COMPLETE METABOLIC PANEL WITH GFR
AG Ratio: 1.7 (calc) (ref 1.0–2.5)
ALT: 8 U/L (ref 6–29)
AST: 21 U/L (ref 10–35)
Albumin: 4.5 g/dL (ref 3.6–5.1)
Alkaline phosphatase (APISO): 100 U/L (ref 37–153)
BUN: 13 mg/dL (ref 7–25)
CO2: 29 mmol/L (ref 20–32)
Calcium: 9.7 mg/dL (ref 8.6–10.4)
Chloride: 103 mmol/L (ref 98–110)
Creat: 0.73 mg/dL (ref 0.50–1.05)
Globulin: 2.7 g/dL (calc) (ref 1.9–3.7)
Glucose, Bld: 110 mg/dL — ABNORMAL HIGH (ref 65–99)
Potassium: 4.4 mmol/L (ref 3.5–5.3)
Sodium: 139 mmol/L (ref 135–146)
Total Bilirubin: 0.6 mg/dL (ref 0.2–1.2)
Total Protein: 7.2 g/dL (ref 6.1–8.1)
eGFR: 91 mL/min/{1.73_m2} (ref >=60)

## 2020-11-17 LAB — LIPID PANEL
Cholesterol: 213 mg/dL — ABNORMAL HIGH (ref <200)
HDL: 52 mg/dL (ref >=50)
LDL Cholesterol (Calc): 136 mg/dL (calc) — ABNORMAL HIGH
Non-HDL Cholesterol (Calc): 161 mg/dL (calc) — ABNORMAL HIGH (ref <130)
Total CHOL/HDL Ratio: 4.1 (calc) (ref <5.0)
Triglycerides: 127 mg/dL (ref <150)

## 2021-01-20 ENCOUNTER — Other Ambulatory Visit: Payer: Self-pay

## 2021-01-20 ENCOUNTER — Ambulatory Visit (INDEPENDENT_AMBULATORY_CARE_PROVIDER_SITE_OTHER): Payer: Medicare HMO

## 2021-01-20 VITALS — BP 142/92 | HR 88 | Temp 98.2°F | Resp 16 | Ht 61.0 in | Wt 145.6 lb

## 2021-01-20 DIAGNOSIS — Z1231 Encounter for screening mammogram for malignant neoplasm of breast: Secondary | ICD-10-CM | POA: Diagnosis not present

## 2021-01-20 DIAGNOSIS — Z0000 Encounter for general adult medical examination without abnormal findings: Secondary | ICD-10-CM

## 2021-01-20 DIAGNOSIS — Z780 Asymptomatic menopausal state: Secondary | ICD-10-CM | POA: Diagnosis not present

## 2021-01-20 NOTE — Progress Notes (Signed)
Subjective:   Jodi Fernandez is a 65 y.o. female who presents for Medicare Annual (Subsequent) preventive examination.  Review of Systems     Cardiac Risk Factors include: advanced age (>63men, >86 women);dyslipidemia;hypertension     Objective:    Today's Vitals   01/20/21 1053  BP: (!) 142/92  Pulse: 88  Resp: 16  Temp: 98.2 F (36.8 C)  TempSrc: Oral  SpO2: 98%  Weight: 145 lb 9.6 oz (66 kg)  Height: 5\' 1"  (1.549 m)   Body mass index is 27.51 kg/m.  Advanced Directives 01/20/2021 01/20/2020 02/20/2017 11/20/2016 08/21/2016 08/16/2016 07/28/2016  Does Patient Have a Medical Advance Directive? No No No No No No No  Would patient like information on creating a medical advance directive? No - Patient declined Yes (MAU/Ambulatory/Procedural Areas - Information given) - - - - -    Current Medications (verified) Outpatient Encounter Medications as of 01/20/2021  Medication Sig   acetaminophen (TYLENOL) 650 MG CR tablet Take 1 tablet (650 mg total) by mouth as needed for pain.   Coenzyme Q10 (CO Q 10 PO) Take 300 mg by mouth daily.   hydrALAZINE (APRESOLINE) 10 MG tablet Take 1 tablet (10 mg total) by mouth 3 (three) times daily. Prn bp above 150/90   ibuprofen (ADVIL,MOTRIN) 200 MG tablet Take 200 mg by mouth every 6 (six) hours as needed. Reported on 11/18/2015   OVER THE COUNTER MEDICATION Immunity support vitamin with elderberry, zinc, vitamin C   rosuvastatin (CRESTOR) 5 MG tablet Take 1 tablet (5 mg total) by mouth 3 (three) times a week.   telmisartan-hydrochlorothiazide (MICARDIS HCT) 40-12.5 MG tablet Take 1 tablet by mouth daily.   No facility-administered encounter medications on file as of 01/20/2021.    Allergies (verified) Patient has no known allergies.   History: Past Medical History:  Diagnosis Date   Abnormal glucose    Allergy    Generalized headache    Hemorrhoids    Hyperlipidemia    Hypertension    Insomnia    [place] spotted fever     Ventricular tachycardia (HCC) 08/11/2016   received [facility] ([facility])   Past Surgical History:  Procedure Laterality Date   COLONOSCOPY     TUBAL LIGATION  1987   Family History  Problem Relation Age of Onset   Heart disease Mother    Diabetes Mother    Lesch-Nyhan syndrome Mother    Alcohol abuse Father    Heart disease Father    Heart disease Sister    Hypertension Brother    Hypertension Brother    Hypertension Brother    Hypertension Brother    Other Daughter        positive for lynch syndrome   Other Daughter    Breast cancer Paternal Aunt 85   Social History   Socioeconomic History   Marital status: Married    Spouse name: 89   Number of children: 2   Years of education: Not on file   Highest education level: Associate degree: occupational, [employer] [place], or vocational program  Occupational History   Occupation: retired    Comment: Scientist, product/process development   Occupation: Geologist, engineering  Tobacco Use   Smoking status: Never   Smokeless tobacco: Never  Lawyer Use: Never used  Substance and Sexual Activity   Alcohol use: No    Alcohol/week: 0.0 standard drinks   Drug use: No   Sexual activity: Yes    Partners: Female  Other Topics Concern   Not on file  Social History Narrative   Married   Retired Geologist, 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 She watches them after school about twice a week   Husband is a Chartered loss adjuster, works on cars, but does not seem to care about their house   Social Determinants of Corporate investment banker Strain: Low Risk    Difficulty of Paying Living Expenses: Not hard at all  Food Insecurity: No Food Insecurity   Worried About Programme researcher, broadcasting/film/video in the Last Year: Never true   Barista in the Last Year: Never true  Transportation Needs: No  Transportation Needs   Lack of Transportation (Medical): No   Lack of Transportation (Non-Medical): No  Physical Activity: Insufficiently Active   Days of Exercise per Week: 2 days   Minutes of Exercise per Session: 20 min  Stress: No Stress Concern Present   Feeling of Stress : Only a little  Social Connections: Press photographer of Communication with Friends and Family: More than three times a week   Frequency of Social Gatherings with Friends and Family: More than three times a week   Attends Religious Services: More than 4 times per year   Active Member of [employer] or Organizations: Yes   Attends Engineer, structural: More than 4 times per year   Marital Status: Married    Tobacco Counseling Counseling given: Not Answered   Clinical Intake:  Pre-visit preparation completed: Yes  Pain : No/denies pain     BMI - recorded: 27.51 Nutritional Status: BMI 25 -29 Overweight Nutritional Risks: None Diabetes: No  How often do you need to have someone help you when you read instructions, pamphlets, or other written materials from your doctor or pharmacy?: 1 - Never   Interpreter Needed?: No  Information entered by :: Reather Littler LPN   Activities of Daily Living In your present state of health, do you have any difficulty performing the following activities: 01/20/2021 11/16/2020  Hearing? Y N  Comment declines hearing aids -  Vision? N N  Difficulty concentrating or making decisions? N N  Walking or climbing stairs? N N  Dressing or bathing? N N  Doing errands, shopping? N N  Preparing Food and eating ? N -  Using the Toilet? N -  In the past six months, have you accidently leaked urine? N -  Do you have problems with loss of bowel control? N -  Managing your Medications? N -  Managing your Finances? N -  Housekeeping or managing your Housekeeping? N -  Some recent data might be hidden    Patient Care Team: Alba Cory, MD as PCP - General  (Family Medicine) Kieth Brightly, MD (General Surgery) Vita Erm, MD as Consulting Physician (Cardiology) Linus Salmons, MD (Otolaryngology) Theodore Demark, Alden Benjamin, MD as Referring Physician (Gastroenterology) Isla Pence, OD (Optometry)  Indicate any recent Medical Services you may have received from other than [facility] providers in the past year (date may be approximate).     Assessment:   This is a routine wellness examination for Pranika.  Hearing/Vision screen Hearing Screening - Comments:: Pt c/o mild hearing difficulty; may eventually need hearing aids  Vision Screening - Comments:: Due for eye exam. Established at [facility]  Dietary  issues and exercise activities discussed: Current Exercise Habits: Home exercise routine, Type of exercise: walking, Time (Minutes): 20, Frequency (Times/Week): 2, Weekly Exercise (Minutes/Week): 40, Intensity: Mild, Exercise limited by: None identified   Goals Addressed   None    Depression Screen PHQ 2/9 Scores 01/20/2021 11/16/2020 05/10/2020 01/20/2020 11/05/2019 08/05/2019 02/05/2019  PHQ - 2 Score 0 0 0 0 0 0 1  PHQ- 9 Score - - 2 - 0 0 4    Fall Risk Fall Risk  01/20/2021 11/16/2020 05/10/2020 05/10/2020 01/20/2020  Falls in the past year? 0 0 0 0 0  Number falls in past yr: 0 0 0 0 0  Injury with Fall? 0 0 0 0 0  Risk for fall due to : No Fall Risks - - - No Fall Risks  Follow up Falls prevention discussed - - - Falls prevention discussed    FALL RISK PREVENTION PERTAINING TO THE HOME:  Any stairs in or around the home? Yes  If so, are there any without handrails? Yes  - holds on to the wall Home free of loose throw rugs in walkways, pet beds, electrical cords, etc? Yes  Adequate lighting in your home to reduce risk of falls? Yes   ASSISTIVE DEVICES UTILIZED TO PREVENT FALLS:  Life alert? No  Use of a cane, walker or w/c? No  Grab bars in the bathroom? No  Shower chair or bench in shower? No  Elevated toilet seat or  a handicapped toilet? No   TIMED UP AND GO:  Was the test performed? Yes .  Length of time to ambulate 10 feet: 5 sec.   Gait steady and fast without use of assistive device  Cognitive Function: Normal cognitive status assessed by direct observation by this Nurse Health Advisor. No abnormalities found.          Immunizations Immunization History  Administered Date(s) Administered   Fluad Quad(high Dose 65+) 02/05/2019, 05/10/2020   Influenza,inj,Quad PF,6+ Mos 02/20/2013, 03/03/2014, 02/15/2015, 02/20/2017, 02/19/2018   Influenza-Unspecified 03/03/2014   PFIZER(Purple Top)SARS-COV-2 Vaccination 06/19/2019, 07/16/2019   Pneumococcal Conjugate-13 12/24/2018   Pneumococcal Polysaccharide-23 01/20/2020   Tdap 05/08/2010, 06/06/2018   Zoster Recombinat (Shingrix) 06/06/2018, 12/24/2018   Zoster, Live 02/15/2015    TDAP status: Up to date  Flu Vaccine status: Due, Education has been provided regarding the importance of this vaccine. Advised may receive this vaccine at local pharmacy or Health Dept. Aware to provide a copy of the vaccination record if obtained from local pharmacy or Health Dept. Verbalized acceptance and understanding.  Pneumococcal vaccine status: Up to date  Covid-19 vaccine status: Completed vaccines  Qualifies for Shingles Vaccine? Yes   Zostavax completed Yes   Shingrix Completed?: Yes  Screening Tests Health Maintenance  Topic Date Due   COVID-19 Vaccine (3 - Booster for Pfizer series) 12/16/2019   INFLUENZA VACCINE  12/06/2020   MAMMOGRAM  03/30/2021   COLONOSCOPY (Pts 45-36yrs Insurance coverage will need to be confirmed)  08/12/2026   TETANUS/TDAP  06/06/2028   DEXA SCAN  Completed   Hepatitis C Screening  Completed   PNA vac Low Risk Adult  Completed   Zoster Vaccines- Shingrix  Completed   HPV VACCINES  Aged Out    Health Maintenance  Health Maintenance Due  Topic Date Due   COVID-19 Vaccine (3 - Booster for Pfizer series) 12/16/2019    INFLUENZA VACCINE  12/06/2020    Colorectal cancer screening: Type of screening: Colonoscopy. Completed 08/11/16. Repeat every 10 years  Mammogram status:  Completed 03/30/20. Repeat every year  Bone Density status: Completed 01/21/19. Results reflect: Bone density results: OSTEOPENIA. Repeat every 2 years.  Lung Cancer Screening: (Low Dose CT Chest recommended if Age 49-80 years, 30 pack-year currently smoking OR have quit w/in 15years.) does not qualify.   Additional Screening:  Hepatitis C Screening: does qualify; Completed 05/09/12  Vision Screening: Recommended annual ophthalmology exams for early detection of glaucoma and other disorders of the eye. Is the patient up to date with their annual eye exam?  No  Who is the provider or what is the name of the office in which the patient attends annual eye exams? Dr. Clydene Pugh.   Dental Screening: Recommended annual dental exams for proper oral hygiene  Community Resource Referral / Chronic Care Management: CRR required this visit?  No   CCM required this visit?  No      Plan:     I have personally reviewed and noted the following in the patient's chart:   Medical and social history Use of alcohol, tobacco or illicit drugs  Current medications and supplements including opioid prescriptions.  Functional ability and status Nutritional status Physical activity Advanced directives List of other physicians Hospitalizations, surgeries, and ER visits in previous 12 months Vitals Screenings to include cognitive, depression, and falls Referrals and appointments  In addition, I have reviewed and discussed with patient certain preventive protocols, quality metrics, and best practice recommendations. A written personalized care plan for preventive services as well as general preventive health recommendations were provided to patient.     Reather Littler, LPN   5/73/2202   Nurse Notes: pt states blood pressure at home ranging from  120-130/78-80. Pt also states she plans to increase exercise and work on diet to reduce sodium and improve blood pressure.

## 2021-01-20 NOTE — Patient Instructions (Signed)
Jodi Fernandez , Thank you for taking time to come for your Medicare Wellness Visit. I appreciate your ongoing commitment to your health goals. Please review the following plan we discussed and let me know if I can assist you in the future.   Screening recommendations/referrals: Colonoscopy: done 08/11/16. Repeat 08/2026 Mammogram: done 03/30/20. Please call [phone] to schedule your mammogram and bone density screening.  Bone Density: done 01/21/19 Recommended yearly ophthalmology/optometry visit for glaucoma screening and checkup Recommended yearly dental visit for hygiene and checkup  Vaccinations: Influenza vaccine: done 05/10/20 Pneumococcal vaccine: done 01/20/20 Tdap vaccine: done 06/06/18 Shingles vaccine: done 06/06/18 & 12/24/18   Covid-19:done 06/19/19 & 07/16/19  Advanced directives: Please bring a copy of your health care power of attorney and living will to the office at your convenience once you have completed that paperwork.  Conditions/risks identified: Recommend increasing physical activity to at least 3 days per week   Next appointment: Follow up in one year for your annual wellness visit    Preventive Care 65 Years and Older, Female Preventive care refers to lifestyle choices and visits with your health care provider that can promote health and wellness. What does preventive care include? A yearly physical exam. This is also called an annual well check. Dental exams once or twice a year. Routine eye exams. Ask your health care provider how often you should have your eyes checked. Personal lifestyle choices, including: Daily care of your teeth and gums. Regular physical activity. Eating a healthy diet. Avoiding tobacco and drug use. Limiting alcohol use. Practicing safe sex. Taking low-dose aspirin every day. Taking vitamin and mineral supplements as recommended by your health care provider. What happens during an annual well check? The services and screenings done by  your health care provider during your annual well check will depend on your age, overall health, lifestyle risk factors, and family history of disease. Counseling  Your health care provider may ask you questions about your: Alcohol use. Tobacco use. Drug use. Emotional well-being. Home and relationship well-being. Sexual activity. Eating habits. History of falls. Memory and ability to understand (cognition). Work and work Astronomer. Reproductive health. Screening  You may have the following tests or measurements: Height, weight, and BMI. Blood pressure. Lipid and cholesterol levels. These may be checked every 5 years, or more frequently if you are over 70 years old. Skin check. Lung cancer screening. You may have this screening every year starting at age 31 if you have a 30-pack-year history of smoking and currently smoke or have quit within the past 15 years. Fecal occult blood test (FOBT) of the stool. You may have this test every year starting at age 34. Flexible sigmoidoscopy or colonoscopy. You may have a sigmoidoscopy every 5 years or a colonoscopy every 10 years starting at age 39. Hepatitis C blood test. Hepatitis B blood test. Sexually transmitted disease (STD) testing. Diabetes screening. This is done by checking your blood sugar (glucose) after you have not eaten for a while (fasting). You may have this done every 1-3 years. Bone density scan. This is done to screen for osteoporosis. You may have this done starting at age 23. Mammogram. This may be done every 1-2 years. Talk to your health care provider about how often you should have regular mammograms. Talk with your health care provider about your test results, treatment options, and if necessary, the need for more tests. Vaccines  Your health care provider may recommend certain vaccines, such as: Influenza vaccine. This is recommended every  year. Tetanus, diphtheria, and acellular pertussis (Tdap, Td) vaccine. You  may need a Td booster every 10 years. Zoster vaccine. You may need this after age 32. Pneumococcal 13-valent conjugate (PCV13) vaccine. One dose is recommended after age 69. Pneumococcal polysaccharide (PPSV23) vaccine. One dose is recommended after age 43. Talk to your health care provider about which screenings and vaccines you need and how often you need them. This information is not intended to replace advice given to you by your health care provider. Make sure you discuss any questions you have with your health care provider. Document Released: 05/21/2015 Document Revised: 01/12/2016 Document Reviewed: 02/23/2015 Elsevier Interactive Patient Education  2017 [employer].  Fall Prevention in the Home Falls can cause injuries. They can happen to people of all ages. There are many things you can do to make your home safe and to help prevent falls. What can I do on the outside of my home? Regularly fix the edges of walkways and driveways and fix any cracks. Remove anything that might make you trip as you walk through a door, such as a raised step or threshold. Trim any bushes or trees on the path to your home. Use bright outdoor lighting. Clear any walking paths of anything that might make someone trip, such as rocks or tools. Regularly check to see if handrails are loose or broken. Make sure that both sides of any steps have handrails. Any raised decks and porches should have guardrails on the edges. Have any leaves, snow, or ice cleared regularly. Use sand or salt on walking paths during winter. Clean up any spills in your garage right away. This includes oil or grease spills. What can I do in the bathroom? Use night lights. Install grab bars by the toilet and in the tub and shower. Do not use towel bars as grab bars. Use non-skid mats or decals in the tub or shower. If you need to sit down in the shower, use a plastic, non-slip stool. Keep the floor dry. Clean up any water that spills  on the floor as soon as it happens. Remove soap buildup in the tub or shower regularly. Attach bath mats securely with double-sided non-slip rug tape. Do not have throw rugs and other things on the floor that can make you trip. What can I do in the bedroom? Use night lights. Make sure that you have a light by your bed that is easy to reach. Do not use any sheets or blankets that are too big for your bed. They should not hang down onto the floor. Have a firm chair that has side arms. You can use this for support while you get dressed. Do not have throw rugs and other things on the floor that can make you trip. What can I do in the kitchen? Clean up any spills right away. Avoid walking on wet floors. Keep items that you use a lot in easy-to-reach places. If you need to reach something above you, use a strong step stool that has a grab bar. Keep electrical cords out of the way. Do not use floor polish or wax that makes floors slippery. If you must use wax, use non-skid floor wax. Do not have throw rugs and other things on the floor that can make you trip. What can I do with my stairs? Do not leave any items on the stairs. Make sure that there are handrails on both sides of the stairs and use them. Fix handrails that are broken or  loose. Make sure that handrails are as long as the stairways. Check any carpeting to make sure that it is firmly attached to the stairs. Fix any carpet that is loose or worn. Avoid having throw rugs at the top or bottom of the stairs. If you do have throw rugs, attach them to the floor with carpet tape. Make sure that you have a light switch at the top of the stairs and the bottom of the stairs. If you do not have them, ask someone to add them for you. What else can I do to help prevent falls? Wear shoes that: Do not have high heels. Have rubber bottoms. Are comfortable and fit you well. Are closed at the toe. Do not wear sandals. If you use a stepladder: Make  sure that it is fully opened. Do not climb a closed stepladder. Make sure that both sides of the stepladder are locked into place. Ask someone to hold it for you, if possible. Clearly mark and make sure that you can see: Any grab bars or handrails. First and last steps. Where the edge of each step is. Use tools that help you move around (mobility aids) if they are needed. These include: Canes. Walkers. Scooters. Crutches. Turn on the lights when you go into a dark area. Replace any light bulbs as soon as they burn out. Set up your furniture so you have a clear path. Avoid moving your furniture around. If any of your floors are uneven, fix them. If there are any pets around you, be aware of where they are. Review your medicines with your doctor. Some medicines can make you feel dizzy. This can increase your chance of falling. Ask your doctor what other things that you can do to help prevent falls. This information is not intended to replace advice given to you by your health care provider. Make sure you discuss any questions you have with your health care provider. Document Released: 02/18/2009 Document Revised: 09/30/2015 Document Reviewed: 05/29/2014 Elsevier Interactive Patient Education  2017 [employer].

## 2021-04-05 ENCOUNTER — Other Ambulatory Visit: Payer: Self-pay

## 2021-04-05 ENCOUNTER — Ambulatory Visit
Admission: RE | Admit: 2021-04-05 | Discharge: 2021-04-05 | Disposition: A | Discharge status: Home / Self Care | Payer: Medicare HMO | Source: Ambulatory Visit | Attending: Family Medicine | Admitting: Family Medicine

## 2021-04-05 DIAGNOSIS — Z1231 Encounter for screening mammogram for malignant neoplasm of breast: Secondary | ICD-10-CM | POA: Diagnosis not present

## 2021-05-19 ENCOUNTER — Ambulatory Visit: Payer: Medicare HMO | Admitting: Family Medicine

## 2021-06-13 NOTE — Progress Notes (Signed)
Name: Jodi Fernandez   MRN: [id number]    DOB: 1955-12-30   Date:06/14/2021       Progress Note  Subjective  Chief Complaint  Follow Up  HPI  HTN with white coat hypertension: : bp at home has been usually around 126, it has gone up to 131 , but usually DBP 70's- 82 she only takes hydralazine before coming to doctor's visit since she has  white coat hypertension and today bp is borderline. She denies chest pain , decrease in exercise tolerance or palpitation lately. She has a history of paroxysmal tachycardia, but no problems since. She states last night it was 120/70's but this morning anticipating that she was coming over her bp was 150/86 and she took a dose of hydralazine before she came in.     Pre-diabetes: last hgbA1C was 5.7 % and stable She denies polyphagia, polydipsia or polyuria. She is still trying to avoid carbs   Hyperlipidemia: on statin therapy , he takes medication a few times a week., ASCVD is high but unable to take statins more often because it cause joint pains.ASCVD is a little higher today likely from elevation of BP today . She wants to stop Crestor and try some Dulterra until next visit to see if she can bring level down naturally. Discussed risk of heart attacks and strokes without medications but she is wiling to take the risk   The 10-year ASCVD risk score (Arnett DK, et al., 2019) is: 11.4%   Values used to calculate the score:     Age: 66 years     Sex: Female     Is Non-Hispanic African American: No     Diabetic: No     Tobacco smoker: No     Systolic Blood Pressure: 140 mmHg     Is BP treated: Yes     HDL Cholesterol: 52 mg/dL     Total Cholesterol: 213 mg/dL    GAD and dysthymia: there is a family history of depression ( mother and sister) she is worried about her husband that had a stent placed in Feb and had TIA Dec 2021 he also  has chronic back pain and DM. 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 11/14/2017   Bilateral tinnitus 11/14/2017   Tachycardia, paroxysmal (HCC) 07/25/2016   GAD (generalized anxiety disorder) 11/11/2014   Insomnia, persistent 11/11/2014   Dyspareunia 11/11/2014   Generalized headache 11/11/2014   Bursitis, trochanteric 11/11/2014   History of Rocky Mountain spotted fever 04/11/2014   Dyslipidemia 10/29/2006   Benign hypertension 10/29/2006   Allergic rhinitis 10/29/2006    Past Surgical History:  Procedure Laterality Date   COLONOSCOPY     TUBAL LIGATION  1987    Family History  Problem Relation Age of Onset   Heart disease Mother    Diabetes Mother    Lesch-Nyhan syndrome Mother    Alcohol abuse Father    Heart disease Father    Heart disease Sister    Other Daughter        positive for lynch syndrome   Other Daughter    Breast cancer Paternal Aunt 34   Breast cancer Cousin    Hypertension Brother    Hypertension Brother    Hypertension Brother  Hypertension Brother     Social History   Tobacco Use   Smoking status: Never   Smokeless tobacco: Never  Substance Use Topics   Alcohol use: No    Alcohol/week: 0.0 standard drinks     Current Outpatient Medications:    acetaminophen (TYLENOL) 650 MG CR tablet, Take 1 tablet (650 mg total) by mouth as needed for pain., Disp: 30 tablet, Rfl: 0   Coenzyme Q10 (CO Q 10 PO), Take 300 mg by mouth daily., Disp: , Rfl:    hydrALAZINE (APRESOLINE) 10 MG tablet, Take 1 tablet (10 mg total) by mouth 3 (three) times daily. Prn bp above 150/90, Disp: 30 tablet, Rfl: 0   ibuprofen (ADVIL,MOTRIN) 200 MG tablet, Take 200 mg by mouth every 6 (six) hours as needed. Reported on 11/18/2015, Disp: , Rfl:    OVER THE COUNTER MEDICATION, Immunity support vitamin with elderberry, zinc, vitamin C, Disp: , Rfl:    rosuvastatin (CRESTOR) 5  MG tablet, Take 1 tablet (5 mg total) by mouth 3 (three) times a week., Disp: 36 tablet, Rfl: 1   telmisartan-hydrochlorothiazide (MICARDIS HCT) 40-12.5 MG tablet, Take 1 tablet by mouth daily., Disp: 90 tablet, Rfl: 1  No Known Allergies  I personally reviewed active problem list, medication list, allergies, family history, social history, health maintenance with the patient/caregiver today.   ROS  Constitutional: Negative for fever or weight change.  Respiratory: Negative for cough and shortness of breath.   Cardiovascular: Negative for chest pain or palpitations.  Gastrointestinal: Negative for abdominal pain, no bowel changes.  Musculoskeletal: Negative for gait problem or joint swelling.  Skin: Negative for rash.  Neurological: Negative for dizziness or headache.  No other specific complaints in a complete review of systems (except as listed in HPI above).   Objective  Vitals:   06/14/21 1134 06/14/21 1142  BP: 140/80 140/76  Pulse: 93   Resp: 16   SpO2: 98%   Weight: 146 lb (66.2 kg)   Height: 5\' 1"  (1.549 m)     Body mass index is 27.59 kg/m.  Physical Exam  Constitutional: Patient appears well-developed and well-nourished. Overweight.  No distress.  HEENT: head atraumatic, normocephalic, pupils equal and reactive to light,  neck supple Cardiovascular: Normal rate, regular rhythm and normal heart sounds.  No murmur heard. No BLE edema. Pulmonary/Chest: Effort normal and breath sounds normal. No respiratory distress. Abdominal: Soft.  There is no tenderness. Psychiatric: Patient has a normal mood and affect. behavior is normal. Judgment and thought content normal.   PHQ2/9: Depression screen [facility] 2/9 06/14/2021 01/20/2021 11/16/2020 05/10/2020 01/20/2020  Decreased Interest 0 0 0 0 0  Down, Depressed, Hopeless 0 0 0 0 0  PHQ - 2 Score 0 0 0 0 0  Altered sleeping 0 - - 1 -  Tired, decreased energy 0 - - 0 -  Change in appetite 0 - - 1 -  Feeling bad or failure about  yourself  0 - - 0 -  Trouble concentrating 0 - - 0 -  Moving slowly or fidgety/restless 0 - - 0 -  Suicidal thoughts 0 - - 0 -  PHQ-9 Score 0 - - 2 -  Difficult doing work/chores - - - Not difficult at all -  Some recent data might be hidden    phq 9 is negative   Fall Risk: Fall Risk  06/14/2021 01/20/2021 11/16/2020 05/10/2020 05/10/2020  Falls in the past year? 0 0 0 0 0  Number falls in past yr:  0 0 0 0 0  Injury with Fall? 0 0 0 0 0  Risk for fall due to : No Fall Risks No Fall Risks - - -  Follow up Falls prevention discussed Falls prevention discussed - - -      Functional Status Survey: Is the patient deaf or have difficulty hearing?: Yes Does the patient have difficulty seeing, even when wearing glasses/contacts?: No Does the patient have difficulty concentrating, remembering, or making decisions?: No Does the patient have difficulty walking or climbing stairs?: No Does the patient have difficulty dressing or bathing?: No Does the patient have difficulty doing errands alone such as visiting a doctor's office or shopping?: No    Assessment & Plan  1. Dyslipidemia  She will finish crestor she has at home and after that try natural way to crop LDL and we will recheck labs next visit   2. White coat syndrome with diagnosis of hypertension  - telmisartan-hydrochlorothiazide (MICARDIS HCT) 40-12.5 MG tablet; Take 1 tablet by mouth daily.  Dispense: 90 tablet; Refill: 1  3. Pre-diabetes   4. Hyperglycemia  Continue low sugar diet  5. GAD (generalized anxiety disorder)  Stable and does not want medications  6. Tachycardia, paroxysmal (HCC)  Doing well at this time

## 2021-06-14 ENCOUNTER — Encounter: Payer: Self-pay | Admitting: Family Medicine

## 2021-06-14 ENCOUNTER — Ambulatory Visit (INDEPENDENT_AMBULATORY_CARE_PROVIDER_SITE_OTHER): Payer: Medicare HMO | Admitting: Family Medicine

## 2021-06-14 VITALS — BP 140/76 | HR 93 | Resp 16 | Ht 61.0 in | Wt 146.0 lb

## 2021-06-14 DIAGNOSIS — R7303 Prediabetes: Secondary | ICD-10-CM

## 2021-06-14 DIAGNOSIS — F411 Generalized anxiety disorder: Secondary | ICD-10-CM

## 2021-06-14 DIAGNOSIS — I479 Paroxysmal tachycardia, unspecified: Secondary | ICD-10-CM | POA: Diagnosis not present

## 2021-06-14 DIAGNOSIS — E785 Hyperlipidemia, unspecified: Secondary | ICD-10-CM

## 2021-06-14 DIAGNOSIS — R69 Illness, unspecified: Secondary | ICD-10-CM | POA: Diagnosis not present

## 2021-06-14 DIAGNOSIS — I10 Essential (primary) hypertension: Secondary | ICD-10-CM | POA: Diagnosis not present

## 2021-06-14 DIAGNOSIS — R739 Hyperglycemia, unspecified: Secondary | ICD-10-CM

## 2021-06-14 MED ORDER — TELMISARTAN-HCTZ 40-12.5 MG PO TABS: 1.0000 | ORAL_TABLET | Freq: Every day | ORAL | 1 refills | Status: DC

## 2021-06-14 NOTE — Patient Instructions (Signed)
Research Polymyalgia Rheumatica or PMR

## 2021-11-22 ENCOUNTER — Encounter: Payer: Medicare HMO | Admitting: Family Medicine

## 2021-12-13 ENCOUNTER — Ambulatory Visit (INDEPENDENT_AMBULATORY_CARE_PROVIDER_SITE_OTHER): Payer: Medicare HMO | Admitting: Family Medicine

## 2021-12-13 ENCOUNTER — Encounter: Payer: Self-pay | Admitting: Family Medicine

## 2021-12-13 VITALS — BP 136/78 | HR 96 | Resp 16 | Ht 63.0 in | Wt 148.0 lb

## 2021-12-13 DIAGNOSIS — I10 Essential (primary) hypertension: Secondary | ICD-10-CM | POA: Diagnosis not present

## 2021-12-13 DIAGNOSIS — I479 Paroxysmal tachycardia, unspecified: Secondary | ICD-10-CM

## 2021-12-13 DIAGNOSIS — Z1231 Encounter for screening mammogram for malignant neoplasm of breast: Secondary | ICD-10-CM

## 2021-12-13 DIAGNOSIS — E785 Hyperlipidemia, unspecified: Secondary | ICD-10-CM | POA: Diagnosis not present

## 2021-12-13 DIAGNOSIS — Z780 Asymptomatic menopausal state: Secondary | ICD-10-CM

## 2021-12-13 DIAGNOSIS — M7062 Trochanteric bursitis, left hip: Secondary | ICD-10-CM | POA: Insufficient documentation

## 2021-12-13 DIAGNOSIS — R7303 Prediabetes: Secondary | ICD-10-CM | POA: Diagnosis not present

## 2021-12-13 DIAGNOSIS — M8580 Other specified disorders of bone density and structure, unspecified site: Secondary | ICD-10-CM | POA: Diagnosis not present

## 2021-12-13 DIAGNOSIS — F411 Generalized anxiety disorder: Secondary | ICD-10-CM

## 2021-12-13 DIAGNOSIS — R739 Hyperglycemia, unspecified: Secondary | ICD-10-CM | POA: Diagnosis not present

## 2021-12-13 DIAGNOSIS — R69 Illness, unspecified: Secondary | ICD-10-CM | POA: Diagnosis not present

## 2021-12-13 MED ORDER — TELMISARTAN-HCTZ 40-12.5 MG PO TABS: 1.0000 | ORAL_TABLET | Freq: Every day | ORAL | 1 refills | Status: DC

## 2021-12-13 NOTE — Patient Instructions (Signed)
Foam rolling at home   Hip Bursitis  Hip bursitis is the swelling of one or more of the fluid-filled sacs (bursae) in the hip joint. The hip bursae absorb shocks and prevent bones from rubbing against each other. If a bursa becomes irritated, it can fill with extra fluid and become inflamed. Hip bursitis can cause mild to moderate pain, and symptoms often come and go over time. What are the causes? This condition results from increased friction between the hip bones and the tendons around the hip joint. This condition can happen if you: Overuse your hip muscles. Injure your hip. Have weak buttocks muscles. Have bone spurs. Have an infection. In some cases, the cause may not be known. What increases the risk? You are more likely to develop this condition if: You injured your hip previously or had hip surgery. You have a medical condition, such as arthritis, gout, diabetes, or thyroid disease. You have spine problems. You have one leg that is shorter than the other. You participate in athletic activities that include repetitive motion, like running. You participate in sports where there is a risk of injury or falling, such as football, martial arts, or skiing. What are the signs or symptoms? Symptoms may come and go, and they often include: Pain in the hip or groin area. Pain may get worse with movement. Tenderness and swelling of the hip. In rare cases, the bursa may become infected. If this happens, you may get a fever, as well as warmth and redness in the hip area. How is this diagnosed? This condition may be diagnosed based on: Your symptoms. Your medical history. A physical exam. Imaging tests, such as: X-rays to check your bones. MRI or ultrasound to check your tendons and muscles. Bone scan. How is this treated? This condition is treated by resting, icing, applying pressure (compression), and raising (elevating) the injured area. This is called RICE treatment. In some cases,  RICE treatment may not be enough to make your symptoms go away. Treatment may also include: Using crutches, a cane, or a walker to decrease the strain on your hip. Taking medicine to help with swelling and pain. Getting a shot of cortisone medicine near the affected area to reduce swelling and pain. Taking antibiotic medicines if there is an infection. Draining fluid out of the bursa to help relieve swelling and pain. Having surgery to remove a damaged or infected bursa. This is rare. Long-term treatment may include: Physical therapy exercises for strength and flexibility. Identifying the cause of your bursitis to prevent future episodes. Lifestyle changes, such as weight loss, to reduce the strain on the hip. Follow these instructions at home: Managing pain, stiffness, and swelling     If directed, put ice on the affected area. To do this: Put ice in a plastic bag. Place a towel between your skin and the bag. Leave the ice on for 20 minutes, 2-3 times a day. Remove the ice if your skin turns bright red. This is very important. If you cannot feel pain, heat, or cold, you have a greater risk of damage to the area. Elevate your hip as much as you can without feeling pain. To do this, put a pillow under your hips while you lie down. If directed, apply heat to the affected area as often as told by your health care provider. Use the heat source that your health care provider recommends, such as a moist heat pack or a heating pad. Place a towel between your skin and  the heat source. Leave the heat on for 20-30 minutes. Remove the heat if your skin turns bright red. This is especially important if you are unable to feel pain, heat, or cold. You may have a greater risk of getting burned. Activity Do not use your hip to support your body weight until your health care provider says that you can. Use crutches, a cane, or a walker as told by your health care provider. If the affected leg is one that  you use to drive, ask your health care provider if it is safe to drive. Rest and protect your hip as much as possible until your pain and swelling get better. Return to your normal activities as told by your health care provider. Ask your health care provider what activities are safe for you. Do exercises as told by your health care provider. General instructions Take over-the-counter and prescription medicines only as told by your health care provider. Gently massage and stretch your injured area as often as is comfortable. Wear compression wraps only as told by your health care provider. If one of your legs is shorter than the other, get fitted for a shoe insert or orthotic. Your health care provider or physical therapist can tell you where to find these items and what size you need. Maintain a healthy weight. Follow instructions from your health care provider for weight control. These may include dietary restrictions. Keep all follow-up visits. This is important. How is this prevented? Exercise regularly or as told by your health care provider. Wear supportive footwear that is appropriate for your sport and daily activities. Warm up and stretch before being active. Cool down and stretch after being active. Take breaks regularly from repetitive activity. If an activity irritates your hip or causes pain, avoid the activity as much as possible. Avoid sitting down for long periods at a time. Where to find more information American Academy [employer]: orthoinfo.aaos.org Contact a health care provider if: You have a fever. You develop new symptoms. You have trouble walking or doing everyday activities. You have pain that gets worse or does not get better with medicine. You develop red skin or a feeling of warmth in your hip area. Get help right away if: You cannot move your hip. You have severe pain. You cannot control the muscles in your feet. Summary Hip bursitis is the  swelling of one or more of the fluid-filled sacs (bursae) in the hip joint. Hip bursitis can cause hip or groin pain, and symptoms often come and go over time. This condition is often treated by resting, icing, applying pressure (compression), and raising (elevating) the injured area. Other treatments may be needed. This information is not intended to replace advice given to you by your health care provider. Make sure you discuss any questions you have with your health care provider. Document Revised: 04/19/2021 Document Reviewed: 04/19/2021 Elsevier Patient Education  2023 [employer].

## 2021-12-13 NOTE — Progress Notes (Signed)
Name: Jodi Fernandez   MRN: [id number]    DOB: [birth year]   Date:12/13/2021       Progress Note  Subjective  Chief Complaint  Follow Up  HPI  HTN with white coat hypertension: : bp at home has been usually around 120-130 's  but usually DBP 70's- 80's she only takes hydralazine before coming to doctor's visit since she has  white coat hypertension and today bp is borderline. She denies chest pain , decrease in exercise tolerance or palpitation lately. She has a history of paroxysmal tachycardia, but no problems since.    Pre-diabetes: last hgbA1C was 5.7 % and stable She denies polyphagia, polydipsia or polyuria. She is not following a low carb diet at this time    Hyperlipidemia: on statin therapy , he takes medication a few times a week., ASCVD is high but unable to take statins more often because it cause joint pains.ASCVD is a little higher today likely from elevation of BP today . She wants to stop Crestor and try some Dulterra until next visit to see if she can bring level down naturally. Discussed risk of heart attacks and strokes without medications but she is wiling to take the risk . Unchanged   The 10-year ASCVD risk score (Arnett DK, et al., 2019) is: 11.4%   Values used to calculate the score:     Age: 66 years     Sex: Female     Is Non-Hispanic African American: No     Diabetic: No     Tobacco smoker: No     Systolic Blood Pressure: 140 mmHg     Is BP treated: Yes     HDL Cholesterol: 52 mg/dL     Total Cholesterol: 213 mg/dL    GAD and dysthymia: there is a family history of depression ( mother and sister) she also worries about her husbands health, has CAD, history of TIA, also DM and chronic pain   Osteopenia: discussed high calcium diet and vitamin D supplementation   Trochanteric bursitis: she states since around March she noticed pain on left lateral thigh when sleeping, improves when she sleeps on her back, only happens during sleep on left lateral decubitus    Patient Active Problem List   Diagnosis Date Noted   Trochanteric bursitis, left hip 12/13/2021   Pre-diabetes 12/13/2021   White coat syndrome with diagnosis of hypertension 02/05/2019   Osteopenia after menopause 01/22/2019   Conductive hearing loss 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 11/14/2017   Bilateral tinnitus 11/14/2017   Tachycardia, paroxysmal (HCC) 07/25/2016   GAD (generalized anxiety disorder) 11/11/2014   Insomnia, persistent 11/11/2014   Dyspareunia 11/11/2014   Generalized headache 11/11/2014   Bursitis, trochanteric 11/11/2014   History of Rocky Mountain spotted fever 04/11/2014   Dyslipidemia 10/29/2006   Benign hypertension 10/29/2006   Allergic rhinitis 10/29/2006    Past Surgical History:  Procedure Laterality Date   COLONOSCOPY     TUBAL LIGATION  1987    Family History  Problem Relation Age of Onset   Heart disease Mother    Diabetes Mother    Lesch-Nyhan syndrome Mother    Alcohol abuse Father    Heart disease Father    Heart disease Sister    Other Daughter        positive for lynch syndrome   Other Daughter    Breast cancer Paternal Aunt 32   Breast cancer Cousin    Hypertension Brother  Hypertension Brother    Hypertension Brother    Hypertension Brother     Social History   Tobacco Use   Smoking status: Never   Smokeless tobacco: Never  Substance Use Topics   Alcohol use: No    Alcohol/week: 0.0 standard drinks of alcohol     Current Outpatient Medications:    acetaminophen (TYLENOL) 650 MG CR tablet, Take 1 tablet (650 mg total) by mouth as needed for pain., Disp: 30 tablet, Rfl: 0   Cholecalciferol (D3 ADULT PO), Take 1 tablet by mouth daily., Disp: , Rfl:    Coenzyme Q10 (CO Q 10 PO), Take 300 mg by mouth daily., Disp: , Rfl:    hydrALAZINE (APRESOLINE) 10 MG tablet, Take 1 tablet (10 mg total) by mouth 3 (three) times daily. Prn bp above 150/90, Disp: 30 tablet, Rfl: 0   ibuprofen (ADVIL,MOTRIN) 200 MG  tablet, Take 200 mg by mouth every 6 (six) hours as needed. Reported on 11/18/2015, Disp: , Rfl:    OVER THE COUNTER MEDICATION, Immunity support vitamin with elderberry, zinc, vitamin C, Disp: , Rfl:    telmisartan-hydrochlorothiazide (MICARDIS HCT) 40-12.5 MG tablet, Take 1 tablet by mouth daily., Disp: 90 tablet, Rfl: 1  No Known Allergies  I personally reviewed active problem list, medication list, allergies, family history, social history, health maintenance with the patient/caregiver today.   ROS  Constitutional: Negative for fever or weight change.  Respiratory: Negative for cough and shortness of breath.   Cardiovascular: Negative for chest pain or palpitations.  Gastrointestinal: Negative for abdominal pain, no bowel changes.  Musculoskeletal: Negative for gait problem or joint swelling.  Skin: Negative for rash.  Neurological: Negative for dizziness or headache.  No other specific complaints in a complete review of systems (except as listed in HPI above).   Objective  Vitals:   12/13/21 0851 12/13/21 0955  BP: (!) 140/82 136/78  Pulse: 96   Resp: 16   SpO2: 100%   Weight: 148 lb (67.1 kg)   Height: 5\' 3"  (1.6 m)     Body mass index is 26.22 kg/m.  Physical Exam  Constitutional: Patient appears well-developed and well-nourished.  No distress.  HEENT: head atraumatic, normocephalic, pupils equal and reactive to light, neck supple Cardiovascular: Normal rate, regular rhythm and normal heart sounds.  No murmur heard. No BLE edema. Pulmonary/Chest: Effort normal and breath sounds normal. No respiratory distress. Abdominal: Soft.  There is no tenderness. Psychiatric: Patient has a normal mood and affect. behavior is normal. Judgment and thought content normal.    PHQ2/9:    12/13/2021    8:51 AM 06/14/2021   11:33 AM 01/20/2021   10:59 AM 11/16/2020    7:51 AM 05/10/2020   10:06 AM  Depression screen PHQ 2/9  Decreased Interest 0 0 0 0 0  Down, Depressed, Hopeless 0  0 0 0 0  PHQ - 2 Score 0 0 0 0 0  Altered sleeping 0 0   1  Tired, decreased energy 0 0   0  Change in appetite 0 0   1  Feeling bad or failure about yourself  0 0   0  Trouble concentrating 0 0   0  Moving slowly or fidgety/restless 0 0   0  Suicidal thoughts 0 0   0  PHQ-9 Score 0 0   2  Difficult doing work/chores     Not difficult at all    phq 9 is negative   Fall Risk:  12/13/2021    8:51 AM 06/14/2021   11:33 AM 01/20/2021   11:01 AM 11/16/2020    7:51 AM 05/10/2020   10:19 AM  Fall Risk   Falls in the past year? 0 0 0 0 0  Number falls in past yr: 0 0 0 0 0  Injury with Fall? 0 0 0 0 0  Risk for fall due to : No Fall Risks No Fall Risks No Fall Risks    Follow up Falls prevention discussed Falls prevention discussed Falls prevention discussed        Functional Status Survey: Is the patient deaf or have difficulty hearing?: Yes Does the patient have difficulty seeing, even when wearing glasses/contacts?: No Does the patient have difficulty concentrating, remembering, or making decisions?: No Does the patient have difficulty walking or climbing stairs?: No Does the patient have difficulty dressing or bathing?: No Does the patient have difficulty doing errands alone such as visiting a doctor's office or shopping?: No    Assessment & Plan  1. White coat syndrome with diagnosis of hypertension  - telmisartan-hydrochlorothiazide (MICARDIS HCT) 40-12.5 MG tablet; Take 1 tablet by mouth daily.  Dispense: 90 tablet; Refill: 1  2. Pre-diabetes   3. Tachycardia, paroxysmal (HCC)  No problems   4. Dyslipidemia  On diet only   5. GAD (generalized anxiety disorder)   6. Osteopenia after menopause  Advised to get bone density done   7. Breast cancer screening by mammogram  - MM 3D SCREEN BREAST BILATERAL; Future  8. Trochanteric bursitis, left hip   She does not want PT at this time

## 2021-12-14 LAB — COMPLETE METABOLIC PANEL WITH GFR
AG Ratio: 1.7 (calc) (ref 1.0–2.5)
ALT: 10 U/L (ref 6–29)
AST: 18 U/L (ref 10–35)
Albumin: 4.4 g/dL (ref 3.6–5.1)
Alkaline phosphatase (APISO): 89 U/L (ref 37–153)
BUN: 9 mg/dL (ref 7–25)
CO2: 28 mmol/L (ref 20–32)
Calcium: 9.6 mg/dL (ref 8.6–10.4)
Chloride: 104 mmol/L (ref 98–110)
Creat: 0.71 mg/dL (ref 0.50–1.05)
Globulin: 2.6 g/dL (calc) (ref 1.9–3.7)
Glucose, Bld: 104 mg/dL — ABNORMAL HIGH (ref 65–99)
Potassium: 3.8 mmol/L (ref 3.5–5.3)
Sodium: 140 mmol/L (ref 135–146)
Total Bilirubin: 0.4 mg/dL (ref 0.2–1.2)
Total Protein: 7 g/dL (ref 6.1–8.1)
eGFR: 93 mL/min/{1.73_m2} (ref >=60)

## 2021-12-14 LAB — CBC WITH DIFFERENTIAL/PLATELET
Absolute Monocytes: 455 cells/uL (ref 200–950)
Basophils Absolute: 53 cells/uL (ref 0–200)
Basophils Relative: 0.8 %
Eosinophils Absolute: 178 cells/uL (ref 15–500)
Eosinophils Relative: 2.7 %
HCT: 39.2 % (ref 35.0–45.0)
Hemoglobin: 13.7 g/dL (ref 11.7–15.5)
Lymphs Abs: 1571 cells/uL (ref 850–3900)
MCH: 33 pg (ref 27.0–33.0)
MCHC: 34.9 g/dL (ref 32.0–36.0)
MCV: 94.5 fL (ref 80.0–100.0)
MPV: 9.8 fL (ref 7.5–12.5)
Monocytes Relative: 6.9 %
Neutro Abs: 4343 cells/uL (ref 1500–7800)
Neutrophils Relative %: 65.8 %
Platelets: 329 10*3/uL (ref 140–400)
RBC: 4.15 10*6/uL (ref 3.80–5.10)
RDW: 11.8 % (ref 11.0–15.0)
Total Lymphocyte: 23.8 %
WBC: 6.6 10*3/uL (ref 3.8–10.8)

## 2021-12-14 LAB — LIPID PANEL
Cholesterol: 252 mg/dL — ABNORMAL HIGH (ref <200)
HDL: 49 mg/dL — ABNORMAL LOW (ref >=50)
LDL Cholesterol (Calc): 166 mg/dL (calc) — ABNORMAL HIGH
Non-HDL Cholesterol (Calc): 203 mg/dL (calc) — ABNORMAL HIGH (ref <130)
Total CHOL/HDL Ratio: 5.1 (calc) — ABNORMAL HIGH (ref <5.0)
Triglycerides: 209 mg/dL — ABNORMAL HIGH (ref <150)

## 2021-12-14 LAB — HEMOGLOBIN A1C
Hgb A1c MFr Bld: 5.8 % of total Hgb — ABNORMAL HIGH (ref <5.7)
Mean Plasma Glucose: 120 mg/dL
eAG (mmol/L): 6.6 mmol/L

## 2022-01-11 ENCOUNTER — Other Ambulatory Visit: Payer: Self-pay

## 2022-01-11 ENCOUNTER — Telehealth: Payer: Self-pay

## 2022-01-11 DIAGNOSIS — Z13820 Encounter for screening for osteoporosis: Secondary | ICD-10-CM

## 2022-01-11 NOTE — Telephone Encounter (Signed)
Copied from CRM [id number]. Topic: Referral - Request for Referral >> Jan 10, 2022  4:55 PM Franchot Heidelberg wrote: Has patient seen PCP for this complaint? Yes *If NO, is insurance requiring patient see PCP for this issue before PCP can refer them? Referral for which specialty: Bone Density  Preferred provider/office: [facility]  Reason for referral: Bone Density orders needed, hers expire 01/20/2022. She has her mammogram in November.

## 2022-01-12 IMAGING — MG MM DIGITAL SCREENING BILAT W/ TOMO AND CAD
8 series · 8 of 24 positions shown · non-contrast
Comparison: Previous exam(s).

CLINICAL DATA: Screening.

EXAM:
DIGITAL SCREENING BILATERAL MAMMOGRAM WITH TOMOSYNTHESIS AND CAD
TECHNIQUE: Bilateral screening digital craniocaudal and mediolateral oblique
mammograms were obtained. Bilateral screening digital breast
tomosynthesis was performed. The images were evaluated with
computer-aided detection.

[R CC synth-2D]
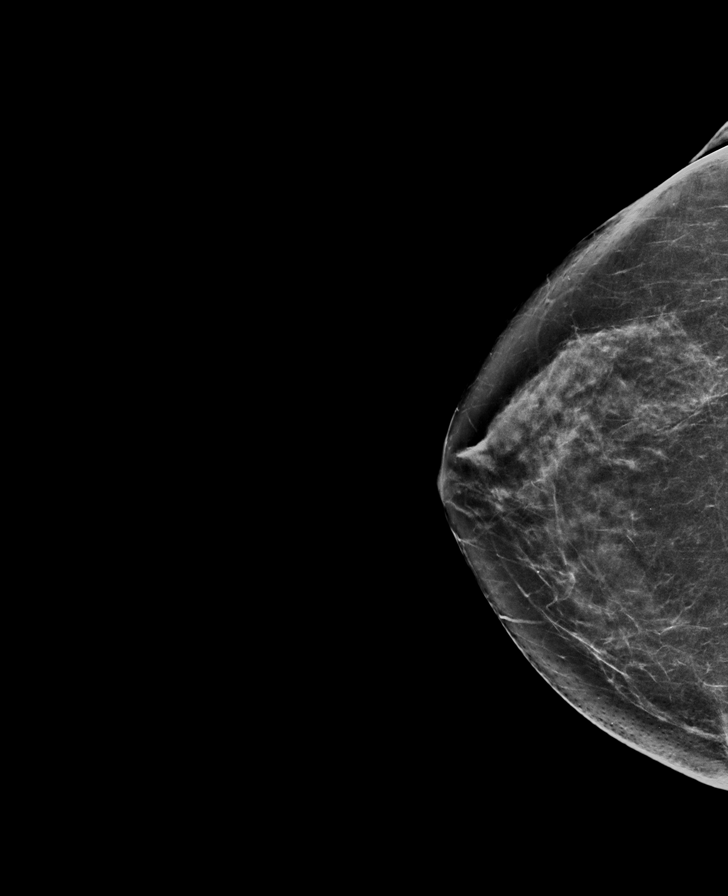

[L MLO synth-2D]
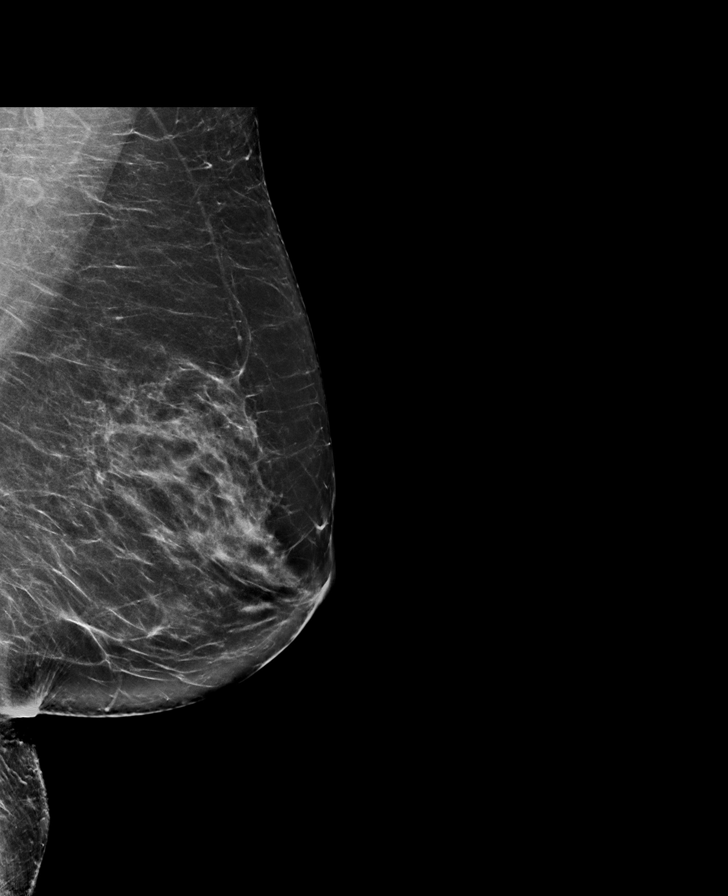

[R MLO synth-2D]
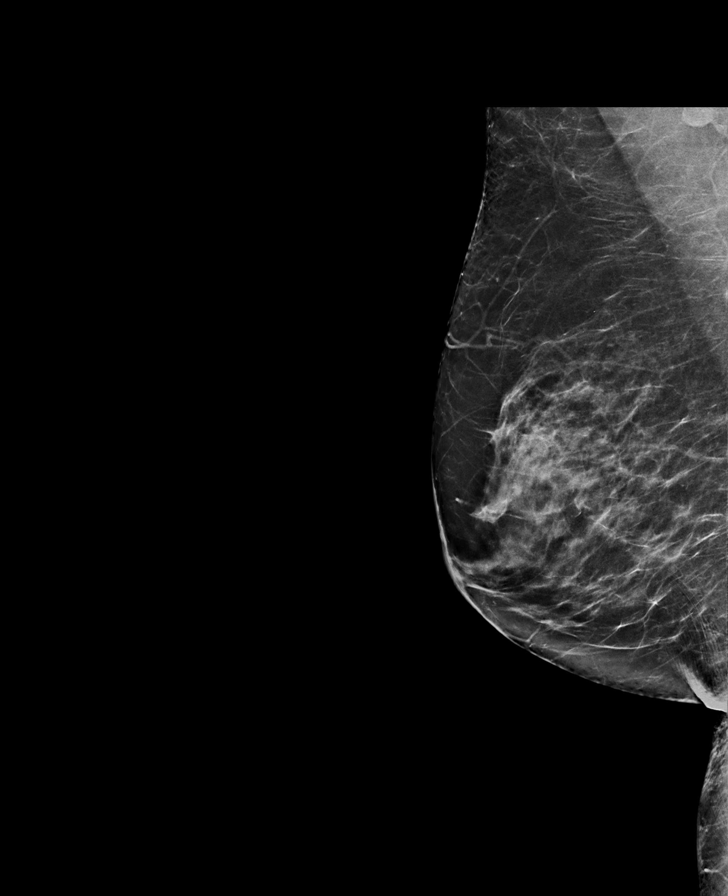

[L CC synth-2D]
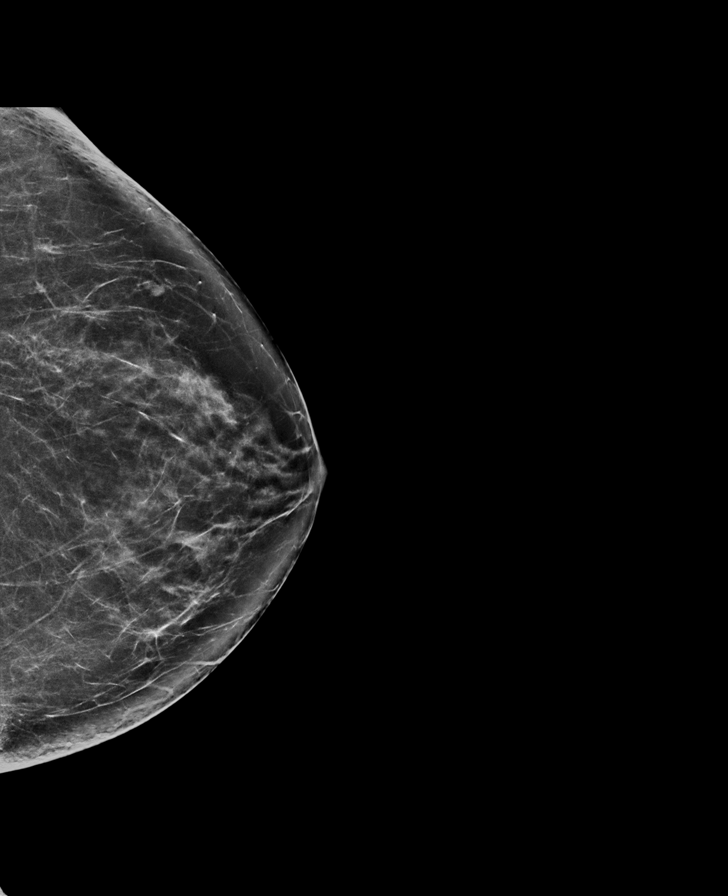

[L CC tomo · tomo slice 38/75.0]
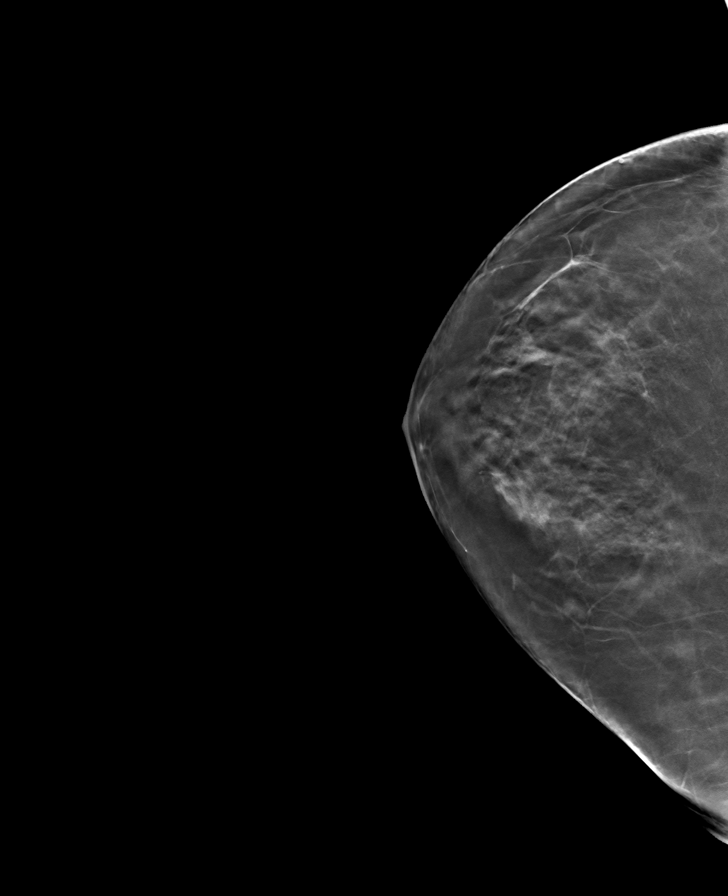

[R MLO tomo · tomo slice 37/74.0]
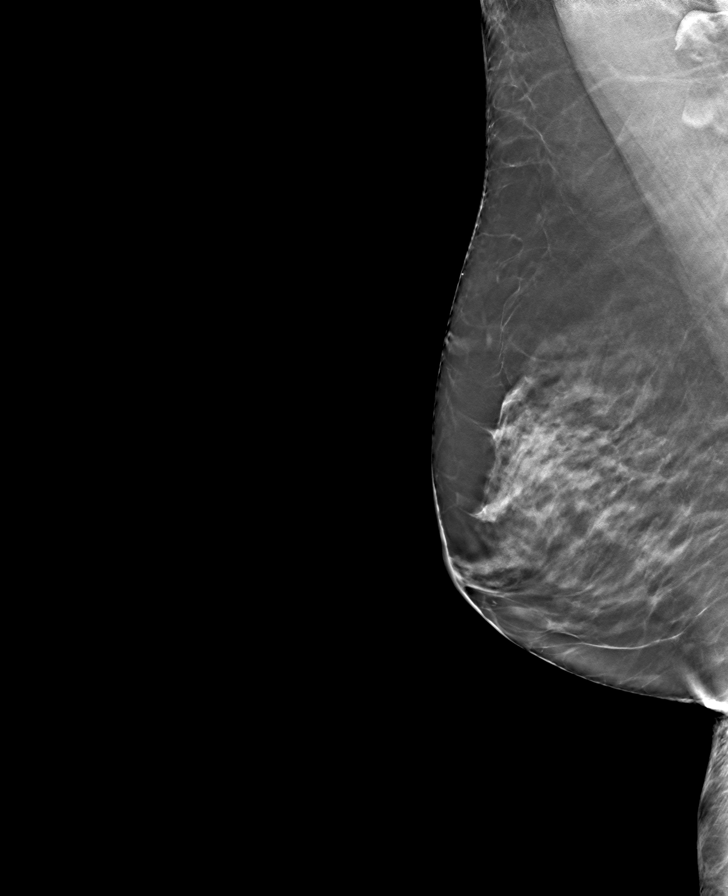

[L MLO tomo · tomo slice 41/82.0]
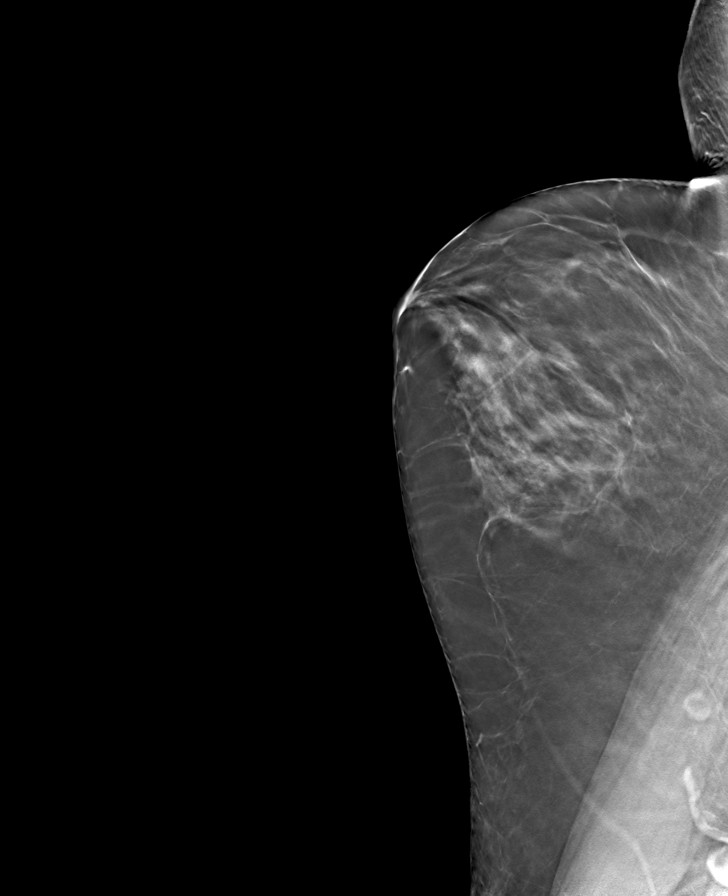

[R CC tomo · tomo slice 36/71.0]
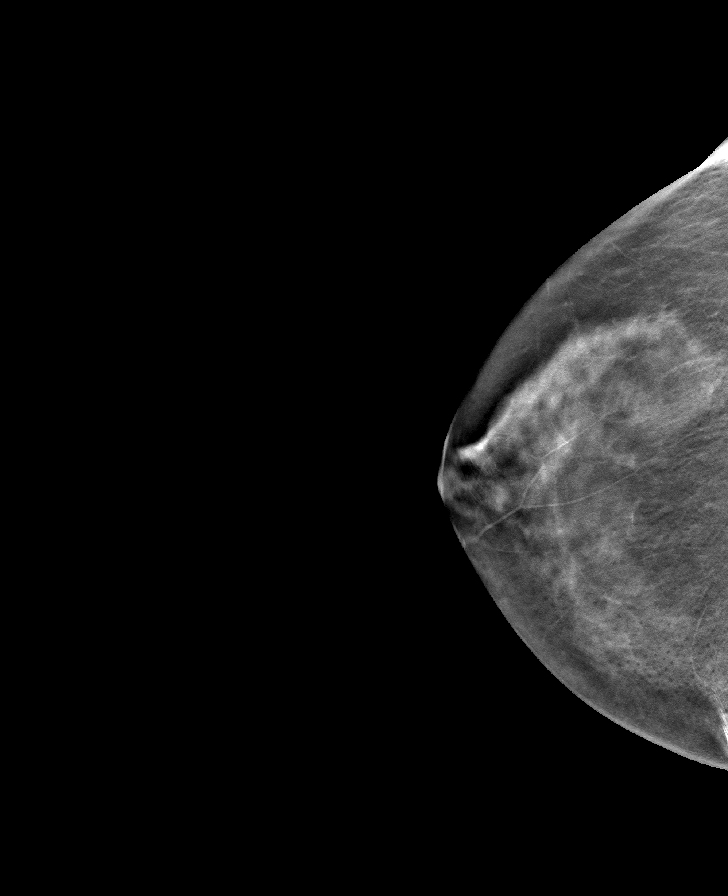

[8 of 24 positions shown; findings below may reference images not displayed]

ACR Breast Density Category b: There are scattered areas of
fibroglandular density.
FINDINGS: There are no findings suspicious for malignancy.
IMPRESSION: No mammographic evidence of malignancy. A result letter of this
screening mammogram will be mailed directly to the patient.

RECOMMENDATION:
Screening mammogram in one year. (Code:51-O-LD2)

BI-RADS CATEGORY  1: Negative.

## 2022-01-24 ENCOUNTER — Ambulatory Visit (INDEPENDENT_AMBULATORY_CARE_PROVIDER_SITE_OTHER): Payer: Medicare HMO

## 2022-01-24 DIAGNOSIS — Z0000 Encounter for general adult medical examination without abnormal findings: Secondary | ICD-10-CM

## 2022-01-24 NOTE — Progress Notes (Signed)
Subjective:  I connected with  Jodi Fernandez on 01/24/22 by a audio enabled telemedicine application and verified that I am speaking with the correct person using two identifiers.  Patient Location: Home  Provider Location: Office/Clinic  I discussed the limitations of evaluation and management by telemedicine. The patient expressed understanding and agreed to proceed.  Jodi Fernandez is a 66 y.o. female who presents for Medicare Annual (Subsequent) preventive examination.  Review of Systems    Defer to PCP       Objective:    There were no vitals filed for this visit. There is no height or weight on file to calculate BMI.     01/20/2021   11:00 AM 01/20/2020   10:54 AM 02/20/2017    9:48 AM 11/20/2016    9:22 AM 08/21/2016    8:44 AM 08/16/2016    2:29 PM 07/28/2016   11:38 AM  Advanced Directives  Does Patient Have a Medical Advance Directive? No No No No No No No  Would patient like information on creating a medical advance directive? No - Patient declined Yes (MAU/Ambulatory/Procedural Areas - Information given)         Current Medications (verified) Outpatient Encounter Medications as of 01/24/2022  Medication Sig   acetaminophen (TYLENOL) 650 MG CR tablet Take 1 tablet (650 mg total) by mouth as needed for pain.   Cholecalciferol (D3 ADULT PO) Take 1 tablet by mouth daily.   Coenzyme Q10 (CO Q 10 PO) Take 300 mg by mouth daily.   hydrALAZINE (APRESOLINE) 10 MG tablet Take 1 tablet (10 mg total) by mouth 3 (three) times daily. Prn bp above 150/90   ibuprofen (ADVIL,MOTRIN) 200 MG tablet Take 200 mg by mouth every 6 (six) hours as needed. Reported on 11/18/2015   OVER THE COUNTER MEDICATION Immunity support vitamin with elderberry, zinc, vitamin C   telmisartan-hydrochlorothiazide (MICARDIS HCT) 40-12.5 MG tablet Take 1 tablet by mouth daily.   No facility-administered encounter medications on file as of 01/24/2022.    Allergies (verified) Patient has no known allergies.    History: Past Medical History:  Diagnosis Date   Abnormal glucose    Allergy    Generalized headache    Hemorrhoids    Hyperlipidemia    Hypertension    Insomnia    Rocky Mountain spotted fever    Ventricular tachycardia ([place]) 08/11/2016   received documention from [facility] (Dr. Mamie Nick)   Past Surgical History:  Procedure Laterality Date   COLONOSCOPY     TUBAL LIGATION  1987   Family History  Problem Relation Age of Onset   Heart disease Mother    Diabetes Mother    Lesch-Nyhan syndrome Mother    Alcohol abuse Father    Heart disease Father    Heart disease Sister    Other Daughter        positive for lynch syndrome   Other Daughter    Breast cancer Paternal Aunt 58   Breast cancer Cousin    Hypertension Brother    Hypertension Brother    Hypertension Brother    Hypertension Brother    Social History   Socioeconomic History   Marital status: Married    Spouse name: Richardson Landry   Number of children: 2   Years of education: Not on file   Highest education level: Associate degree: occupational, Hotel manager, or vocational program  Occupational History   Occupation: retired    Comment: Control and instrumentation engineer   Occupation: Oceanographer  Tobacco  Use   Smoking status: Never   Smokeless tobacco: Never  Vaping Use   Vaping Use: Never used  Substance and Sexual Activity   Alcohol use: No    Alcohol/week: 0.0 standard drinks of alcohol   Drug use: No   Sexual activity: Yes    Partners: Female  Other Topics Concern   Not on file  Social History Narrative   Married   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 She watches them after school about twice a week   Husband is a Ship broker, works on cars, but does not seem to care about their house   Social Determinants of Health   Financial Resource Strain: Low Risk   (01/20/2021)   Overall Financial Resource Strain (CARDIA)    Difficulty of Paying Living Expenses: Not hard at all  Food Insecurity: No Food Insecurity (01/24/2022)   Hunger Vital Sign    Worried About Running Out of Food in the Last Year: Never true    [place] in the Last Year: Never true  Transportation Needs: No Transportation Needs (01/24/2022)   PRAPARE - Hydrologist (Medical): No    Lack of Transportation (Non-Medical): No  Physical Activity: Insufficiently Active (01/24/2022)   Exercise Vital Sign    Days of Exercise per Week: 3 days    Minutes of Exercise per Session: 20 min  Stress: No Stress Concern Present (01/24/2022)   [place]    Feeling of Stress : Only a little  Social Connections: Socially Integrated (01/24/2022)   Social Connection and Isolation Panel [NHANES]    Frequency of Communication with Friends and Family: More than three times a week    Frequency of Social Gatherings with Friends and Family: More than three times a week    Attends Religious Services: More than 4 times per year    Active Member of [employer] or Organizations: Yes    Attends Music therapist: More than 4 times per year    Marital Status: Married    Tobacco Counseling Counseling given: Not Answered   Clinical Intake:  Pre-visit preparation completed: Yes  Pain : No/denies pain     Nutritional Risks: None Diabetes: No  How often do you need to have someone help you when you read instructions, pamphlets, or other written materials from your doctor or pharmacy?: 1 - Never What is the last grade level you completed in school?: 16  Diabetic?No  Interpreter Needed?: No      Activities of Daily Living    12/13/2021    8:51 AM 06/14/2021   11:33 AM  In your present state of health, do you have any difficulty performing the following activities:  Hearing? 1 1  Vision? 0 0   Difficulty concentrating or making decisions? 0 0  Walking or climbing stairs? 0 0  Dressing or bathing? 0 0  Doing errands, shopping? 0 0    Patient Care Team: Steele Sizer, MD as PCP - General (Family Medicine) Christene Lye, MD (General Surgery) Mamie Nick, MD as Consulting Physician (Cardiology) Beverly Gust, MD (Otolaryngology) Malissa Hippo, Gaspar Skeeters, MD as Referring Physician (Gastroenterology) Anell Barr, OD (Optometry)  Indicate any recent Medical Services you may have received from other than [facility] providers in the  past year (date may be approximate).     Assessment:   This is a routine wellness examination for Jodi Fernandez.  Hearing/Vision screen No results found.  Dietary issues and exercise activities discussed:     Goals Addressed   None   Depression Screen    01/24/2022   10:34 AM 12/13/2021    8:51 AM 06/14/2021   11:33 AM 01/20/2021   10:59 AM 11/16/2020    7:51 AM 05/10/2020   10:06 AM 01/20/2020   10:52 AM  PHQ 2/9 Scores  PHQ - 2 Score 0 0 0 0 0 0 0  PHQ- 9 Score 0 0 0   2     Fall Risk    12/13/2021    8:51 AM 06/14/2021   11:33 AM 01/20/2021   11:01 AM 11/16/2020    7:51 AM 05/10/2020   10:19 AM  Fall Risk   Falls in the past year? 0 0 0 0 0  Number falls in past yr: 0 0 0 0 0  Injury with Fall? 0 0 0 0 0  Risk for fall due to : No Fall Risks No Fall Risks No Fall Risks    Follow up Falls prevention discussed Falls prevention discussed Falls prevention discussed      FALL RISK PREVENTION PERTAINING TO THE HOME:  Any stairs in or around the home? Yes  If so, are there any without handrails? Yes  Home free of loose throw rugs in walkways, pet beds, electrical cords, etc? Yes  Adequate lighting in your home to reduce risk of falls? Yes   ASSISTIVE DEVICES UTILIZED TO PREVENT FALLS:  Life alert? No  Use of a cane, walker or w/c? No  Grab bars in the bathroom? No  Shower chair or bench in shower? No  Elevated toilet seat or a  handicapped toilet? No   TIMED UP AND GO:  Was the test performed? No .  Length of time to ambulate 10 feet: N/A sec.    Cognitive Function:        01/24/2022   10:39 AM  6CIT Screen  What Year? 0 points  What month? 0 points  What time? 0 points  Count back from 20 0 points  Months in reverse 0 points  Repeat phrase 0 points  Total Score 0 points    Immunizations Immunization History  Administered Date(s) Administered   Fluad Quad(high Dose 65+) 02/05/2019, 05/10/2020   Influenza,inj,Quad PF,6+ Mos 02/20/2013, 03/03/2014, 02/15/2015, 02/20/2017, 02/19/2018   Influenza-Unspecified 03/03/2014   PFIZER(Purple Top)SARS-COV-2 Vaccination 06/19/2019, 07/16/2019   Pneumococcal Conjugate-13 12/24/2018   Pneumococcal Polysaccharide-23 01/20/2020   Tdap 05/08/2010, 06/06/2018   Zoster Recombinat (Shingrix) 06/06/2018, 12/24/2018   Zoster, Live 02/15/2015    TDAP status: Up to date  Flu Vaccine status: Due, Education has been provided regarding the importance of this vaccine. Advised may receive this vaccine at local pharmacy or Health Dept. Aware to provide a copy of the vaccination record if obtained from local pharmacy or Health Dept. Verbalized acceptance and understanding.  Pneumococcal vaccine status: Completed during today's visit.  Covid-19 vaccine status: Completed vaccines  Qualifies for Shingles Vaccine? Yes   Zostavax completed No   Shingrix Completed?: Yes  Screening Tests Health Maintenance  Topic Date Due   COVID-19 Vaccine (3 - Pfizer series) 09/10/2019   INFLUENZA VACCINE  12/06/2021   MAMMOGRAM  04/05/2022   COLONOSCOPY (Pts 45-29yrs Insurance coverage will need to be confirmed)  08/12/2026   TETANUS/TDAP  06/06/2028   Pneumonia  Vaccine 67+ Years old  Completed   DEXA SCAN  Completed   Hepatitis C Screening  Completed   Zoster Vaccines- Shingrix  Completed   HPV VACCINES  Aged Out    Health Maintenance  Health Maintenance Due  Topic Date Due    COVID-19 Vaccine (3 - Pfizer series) 09/10/2019   INFLUENZA VACCINE  12/06/2021    Colorectal cancer screening: Type of screening: Colonoscopy. Completed 08/11/2016. Repeat every 10 years  Mammogram status: Completed 11/29/22022. Repeat every year scheduled for 04/06/2022.  Bone Density status: Completed 01/21/2019. Results reflect: Bone density results: NORMAL. Repeat every 3 years.  Lung Cancer Screening: (Low Dose CT Chest recommended if Age 74-80 years, 30 pack-year currently smoking OR have quit w/in 15years.) does not qualify.   Lung Cancer Screening Referral: N/A  Additional Screening:  Hepatitis C Screening: does not qualify; Completed 05/09/2012  Vision Screening: Recommended annual ophthalmology exams for early detection of glaucoma and other disorders of the eye. Is the patient up to date with their annual eye exam?  Yes  Who is the provider or what is the name of the office in which the patient attends annual eye exams? Dr. Ellin Mayhew in [place] If pt is not established with a provider, would they like to be referred to a provider to establish care? No .   Dental Screening: Recommended annual dental exams for proper oral hygiene  Community Resource Referral / Chronic Care Management: CRR required this visit?  No   CCM required this visit?  No      Plan:     I have personally reviewed and noted the following in the patient's chart:   Medical and social history Use of alcohol, tobacco or illicit drugs  Current medications and supplements including opioid prescriptions. Patient is not currently taking opioid prescriptions. Functional ability and status Nutritional status Physical activity Advanced directives List of other physicians Hospitalizations, surgeries, and ER visits in previous 12 months Vitals Screenings to include cognitive, depression, and falls Referrals and appointments  In addition, I have reviewed and discussed with patient certain preventive  protocols, quality metrics, and best practice recommendations. A written personalized care plan for preventive services as well as general preventive health recommendations were provided to patient.     Royal Hawthorn, [place]   01/24/2022   Nurse Notes:   Ms. Sester , Thank you for taking time to come for your Medicare Wellness Visit. I appreciate your ongoing commitment to your health goals. Please review the following plan we discussed and let me know if I can assist you in the future.   These are the goals we discussed:  Goals      Weight (lb) < 140 lb (63.5 kg)     Pt states she would like to lose weight over the next year and increase physical activity to at least 3 days per week        This is a list of the screening recommended for you and due dates:  Health Maintenance  Topic Date Due   COVID-19 Vaccine (3 - Pfizer series) 09/10/2019   Flu Shot  12/06/2021   Mammogram  04/05/2022   Colon Cancer Screening  08/12/2026   Tetanus Vaccine  06/06/2028   Pneumonia Vaccine  Completed   DEXA scan (bone density measurement)  Completed   Hepatitis C Screening: USPSTF Recommendation to screen - Ages 26-79 yo.  Completed   Zoster (Shingles) Vaccine  Completed   HPV Vaccine  Aged Out

## 2022-02-20 NOTE — Progress Notes (Deleted)
Name: Jodi Fernandez   MRN: [id number]    DOB: 1955-08-21   Date:02/20/2022       Progress Note  Subjective  Chief Complaint  No chief complaint on file.   HPI  *** Patient Active Problem List   Diagnosis Date Noted   Trochanteric bursitis, left hip 12/13/2021   Pre-diabetes 12/13/2021   White coat syndrome with diagnosis of hypertension 02/05/2019   Osteopenia after menopause 01/22/2019   Conductive hearing loss 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 11/14/2017   Bilateral tinnitus 11/14/2017   Tachycardia, paroxysmal ([place]) 07/25/2016   GAD (generalized anxiety disorder) 11/11/2014   Insomnia, persistent 11/11/2014   Dyspareunia 11/11/2014   Generalized headache 11/11/2014   Bursitis, trochanteric 11/11/2014   History of Rocky Mountain spotted fever 04/11/2014   Dyslipidemia 10/29/2006   Benign hypertension 10/29/2006   Allergic rhinitis 10/29/2006    Past Surgical History:  Procedure Laterality Date   COLONOSCOPY     TUBAL LIGATION  1987    Family History  Problem Relation Age of Onset   Heart disease Mother    Diabetes Mother    Lesch-Nyhan syndrome Mother    Alcohol abuse Father    Heart disease Father    Heart disease Sister    Other Daughter        positive for lynch syndrome   Other Daughter    Breast cancer Paternal Aunt 38   Breast cancer Cousin    Hypertension Brother    Hypertension Brother    Hypertension Brother    Hypertension Brother     Social History   Tobacco Use   Smoking status: Never   Smokeless tobacco: Never  Substance Use Topics   Alcohol use: No    Alcohol/week: 0.0 standard drinks of alcohol     Current Outpatient Medications:    acetaminophen (TYLENOL) 650 MG CR tablet, Take 1 tablet (650 mg total) by mouth as needed for pain., Disp: 30 tablet, Rfl: 0   Cholecalciferol (D3 ADULT PO), Take 1 tablet by mouth daily., Disp: , Rfl:    Coenzyme Q10 (CO Q 10 PO), Take 300 mg by mouth daily., Disp: , Rfl:     hydrALAZINE (APRESOLINE) 10 MG tablet, Take 1 tablet (10 mg total) by mouth 3 (three) times daily. Prn bp above 150/90, Disp: 30 tablet, Rfl: 0   ibuprofen (ADVIL,MOTRIN) 200 MG tablet, Take 200 mg by mouth every 6 (six) hours as needed. Reported on 11/18/2015, Disp: , Rfl:    OVER THE COUNTER MEDICATION, Immunity support vitamin with elderberry, zinc, vitamin C, Disp: , Rfl:    telmisartan-hydrochlorothiazide (MICARDIS HCT) 40-12.5 MG tablet, Take 1 tablet by mouth daily., Disp: 90 tablet, Rfl: 1  No Known Allergies  I personally reviewed {Reviewed:14835} with the patient/caregiver today.   ROS  ***  Objective  There were no vitals filed for this visit.  There is no height or weight on file to calculate BMI.  Physical Exam ***  Recent Results (from the past 2160 hour(s))  Lipid panel     Status: Abnormal   Collection Time: 12/13/21  9:57 AM  Result Value Ref Range   Cholesterol 252 (H) <200 mg/dL   HDL 49 (L) > OR = 50 mg/dL   Triglycerides 209 (H) <150 mg/dL    Comment: . If a non-fasting specimen was collected, consider repeat triglyceride testing on a fasting specimen if clinically indicated.  Yates Decamp et al. J. of Clin. Lipidol. 4917;9:150-569. Marland Kitchen  LDL Cholesterol (Calc) 166 (H) mg/dL (calc)    Comment: Reference range: <100 . Desirable range <100 mg/dL for primary prevention;   <70 mg/dL for patients with CHD or diabetic patients  with > or = 2 CHD risk factors. Marland Kitchen LDL-C is now calculated using the Martin-Hopkins  calculation, which is a validated novel method providing  better accuracy than the Friedewald equation in the  estimation of LDL-C.  Cresenciano Genre et al. Annamaria Helling. 0100;712(19): 2061-2068  (http://education.[facility].com/faq/FAQ164)    Total CHOL/HDL Ratio 5.1 (H) <5.0 (calc)   Non-HDL Cholesterol (Calc) 203 (H) <130 mg/dL (calc)    Comment: For patients with diabetes plus 1 major ASCVD risk  factor, treating to a non-HDL-C goal of <100 mg/dL   (LDL-C of <70 mg/dL) is considered a therapeutic  option.   COMPLETE METABOLIC PANEL WITH GFR     Status: Abnormal   Collection Time: 12/13/21  9:57 AM  Result Value Ref Range   Glucose, Bld 104 (H) 65 - 99 mg/dL    Comment: .            Fasting reference interval . For someone without known diabetes, a glucose value between 100 and 125 mg/dL is consistent with prediabetes and should be confirmed with a follow-up test. .    BUN 9 7 - 25 mg/dL   Creat 0.71 0.50 - 1.05 mg/dL   eGFR 93 > OR = 60 mL/min/1.10m   BUN/Creatinine Ratio SEE NOTE: 6 - 22 (calc)    Comment:    Not Reported: BUN and Creatinine are within    reference range. .    Sodium 140 135 - 146 mmol/L   Potassium 3.8 3.5 - 5.3 mmol/L   Chloride 104 98 - 110 mmol/L   CO2 28 20 - 32 mmol/L   Calcium 9.6 8.6 - 10.4 mg/dL   Total Protein 7.0 6.1 - 8.1 g/dL   Albumin 4.4 3.6 - 5.1 g/dL   Globulin 2.6 1.9 - 3.7 g/dL (calc)   AG Ratio 1.7 1.0 - 2.5 (calc)   Total Bilirubin 0.4 0.2 - 1.2 mg/dL   Alkaline phosphatase (APISO) 89 37 - 153 U/L   AST 18 10 - 35 U/L   ALT 10 6 - 29 U/L  CBC with Differential/Platelet     Status: None   Collection Time: 12/13/21  9:57 AM  Result Value Ref Range   WBC 6.6 3.8 - 10.8 Thousand/uL   RBC 4.15 3.80 - 5.10 Million/uL   Hemoglobin 13.7 11.7 - 15.5 g/dL   HCT 39.2 35.0 - 45.0 %   MCV 94.5 80.0 - 100.0 fL   MCH 33.0 27.0 - 33.0 pg   MCHC 34.9 32.0 - 36.0 g/dL   RDW 11.8 11.0 - 15.0 %   Platelets 329 140 - 400 Thousand/uL   MPV 9.8 7.5 - 12.5 fL   Neutro Abs 4,343 1,500 - 7,800 cells/uL   Lymphs Abs 1,571 850 - 3,900 cells/uL   Absolute Monocytes 455 200 - 950 cells/uL   Eosinophils Absolute 178 15 - 500 cells/uL   Basophils Absolute 53 0 - 200 cells/uL   Neutrophils Relative % 65.8 %   Total Lymphocyte 23.8 %   Monocytes Relative 6.9 %   Eosinophils Relative 2.7 %   Basophils Relative 0.8 %  Hemoglobin A1c     Status: Abnormal   Collection Time: 12/13/21  9:57 AM   Result Value Ref Range   Hgb A1c MFr Bld 5.8 (H) <5.7 % of total  Hgb    Comment: For someone without known diabetes, a hemoglobin  A1c value between 5.7% and 6.4% is consistent with prediabetes and should be confirmed with a  follow-up test. . For someone with known diabetes, a value <7% indicates that their diabetes is well controlled. A1c targets should be individualized based on duration of diabetes, age, comorbid conditions, and other considerations. . This assay result is consistent with an increased risk of diabetes. . Currently, no consensus exists regarding use of hemoglobin A1c for diagnosis of diabetes for children. .    Mean Plasma Glucose 120 mg/dL   eAG (mmol/L) 6.6 mmol/L    Diabetic Foot Exam: Diabetic Foot Exam - Simple   No data filed    ***  PHQ2/9:    01/24/2022   10:34 AM 12/13/2021    8:51 AM 06/14/2021   11:33 AM 01/20/2021   10:59 AM 11/16/2020    7:51 AM  Depression screen PHQ 2/9  Decreased Interest 0 0 0 0 0  Down, Depressed, Hopeless 0 0 0 0 0  PHQ - 2 Score 0 0 0 0 0  Altered sleeping 0 0 0    Tired, decreased energy 0 0 0    Change in appetite 0 0 0    Feeling bad or failure about yourself  0 0 0    Trouble concentrating 0 0 0    Moving slowly or fidgety/restless 0 0 0    Suicidal thoughts 0 0 0    PHQ-9 Score 0 0 0      phq 9 is {gen pos [id number]} ***  Fall Risk:    12/13/2021    8:51 AM 06/14/2021   11:33 AM 01/20/2021   11:01 AM 11/16/2020    7:51 AM 05/10/2020   10:19 AM  Fall Risk   Falls in the past year? 0 0 0 0 0  Number falls in past yr: 0 0 0 0 0  Injury with Fall? 0 0 0 0 0  Risk for fall due to : No Fall Risks No Fall Risks No Fall Risks    Follow up Falls prevention discussed Falls prevention discussed Falls prevention discussed     ***   Functional Status Survey:   ***   Assessment & Plan  *** There are no diagnoses linked to this encounter.

## 2022-02-20 NOTE — Progress Notes (Unsigned)
Name: Jodi Fernandez   MRN: [id number]    DOB: 1955/08/18   Date:02/21/2022       Progress Note  Subjective  Chief Complaint  Annual Exam   HPI  Patient presents for annual CPE.  The 10-year ASCVD risk score (Arnett DK, et al., 2019) is: 12.2%   Values used to calculate the score:     Age: 66 years     Sex: Female     Is Non-Hispanic African American: No     Diabetic: No     Tobacco smoker: No     Systolic Blood Pressure: 354 mmHg     Is BP treated: Yes     HDL Cholesterol: 49 mg/dL     Total Cholesterol: 252 mg/dL   She is taking dultera oils for now, refuses statin therapy. Discussed importance of being physically active   Diet: balanced diet  Exercise: discussed 150 minutes per week   Last Eye Exam: she is due now  Last Dental Exam: she is due now   [place] from 01/20/2021 in [facility]  AUDIT-C Score 0      Depression: Phq 9 is  negative    02/21/2022   10:21 AM 01/24/2022   10:34 AM 12/13/2021    8:51 AM 06/14/2021   11:33 AM 01/20/2021   10:59 AM  Depression screen PHQ 2/9  Decreased Interest 0 0 0 0 0  Down, Depressed, Hopeless 0 0 0 0 0  PHQ - 2 Score 0 0 0 0 0  Altered sleeping 0 0 0 0   Tired, decreased energy 0 0 0 0   Change in appetite 0 0 0 0   Feeling bad or failure about yourself  0 0 0 0   Trouble concentrating 0 0 0 0   Moving slowly or fidgety/restless 0 0 0 0   Suicidal thoughts 0 0 0 0   PHQ-9 Score 0 0 0 0    Hypertension: BP Readings from Last 3 Encounters:  02/21/22 132/72  12/13/21 136/78  06/14/21 140/76   Obesity: Wt Readings from Last 3 Encounters:  02/21/22 145 lb 8 oz (66 kg)  12/13/21 148 lb (67.1 kg)  06/14/21 146 lb (66.2 kg)   BMI Readings from Last 3 Encounters:  02/21/22 26.61 kg/m  12/13/21 26.22 kg/m  06/14/21 27.59 kg/m     Vaccines:   HPV: N/A Tdap: up to date Shingrix: up to date Pneumonia: up to date Flu: refused  COVID-19: refused    Hep C Screening:  05/09/12 STD testing and prevention (HIV/chl/gon/syphilis): N/A Intimate partner violence: negative screen  Sexual History : not sexually in over one year - husband has medical problems  Menstrual History/LMP/Abnormal Bleeding: post-menopausal  Discussed importance of follow up if any post-menopausal bleeding: yes  Incontinence Symptoms: mild and intermittent, stress incontinence when she has a bad cough  Breast cancer:  - Last Mammogram: 04/05/2021, scheduled for Nov  - BRCA gene screening: N/A  Osteoporosis Prevention : Discussed high calcium and vitamin D supplementation, weight bearing exercises Bone density: Scheduled for 04/06/22  Cervical cancer screening:   Skin cancer: Discussed monitoring for atypical lesions  Colorectal cancer: 08/11/2016   Lung cancer:  Low Dose CT Chest recommended if Age 90-80 years, 20 pack-year currently smoking OR have quit w/in 15years. Patient does not qualify for screen   ECG: 12/24/2018  Advanced Care Planning: A voluntary discussion about advance care planning including the explanation and discussion of advance  directives.  Discussed health care proxy and Living will, and the patient was able to identify a health care proxy as husband .  Patient does not have a living will and power of attorney of health care   Lipids: Lab Results  Component Value Date   CHOL 252 (H) 12/13/2021   CHOL 213 (H) 11/16/2020   CHOL 236 (H) 11/05/2019   Lab Results  Component Value Date   HDL 49 (L) 12/13/2021   HDL 52 11/16/2020   HDL 51 11/05/2019   Lab Results  Component Value Date   LDLCALC 166 (H) 12/13/2021   LDLCALC 136 (H) 11/16/2020   LDLCALC 152 (H) 11/05/2019   Lab Results  Component Value Date   TRIG 209 (H) 12/13/2021   TRIG 127 11/16/2020   TRIG 189 (H) 11/05/2019   Lab Results  Component Value Date   CHOLHDL 5.1 (H) 12/13/2021   CHOLHDL 4.1 11/16/2020   CHOLHDL 4.6 11/05/2019   No results found for: "LDLDIRECT"  Glucose: Glucose,  Bld  Date Value Ref Range Status  12/13/2021 104 (H) 65 - 99 mg/dL Final    Comment:    .            Fasting reference interval . For someone without known diabetes, a glucose value between 100 and 125 mg/dL is consistent with prediabetes and should be confirmed with a follow-up test. .   11/16/2020 110 (H) 65 - 99 mg/dL Final    Comment:    .            Fasting reference interval . For someone without known diabetes, a glucose value between 100 and 125 mg/dL is consistent with prediabetes and should be confirmed with a follow-up test. .   11/05/2019 111 (H) 65 - 99 mg/dL Final    Comment:    .            Fasting reference interval . For someone without known diabetes, a glucose value between 100 and 125 mg/dL is consistent with prediabetes and should be confirmed with a follow-up test. .     Patient Active Problem List   Diagnosis Date Noted   Trochanteric bursitis, left hip 12/13/2021   Pre-diabetes 12/13/2021   White coat syndrome with diagnosis of hypertension 02/05/2019   Osteopenia after menopause 01/22/2019   Conductive hearing loss 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 11/14/2017   Bilateral tinnitus 11/14/2017   Tachycardia, paroxysmal ([place]) 07/25/2016   GAD (generalized anxiety disorder) 11/11/2014   Insomnia, persistent 11/11/2014   Dyspareunia 11/11/2014   Generalized headache 11/11/2014   Bursitis, trochanteric 11/11/2014   History of [place] spotted fever 04/11/2014   Dyslipidemia 10/29/2006   Benign hypertension 10/29/2006   Allergic rhinitis 10/29/2006    Past Surgical History:  Procedure Laterality Date   COLONOSCOPY     TUBAL LIGATION  1987    Family History  Problem Relation Age of Onset   Heart disease Mother    Diabetes Mother    Lesch-Nyhan syndrome Mother    Alcohol abuse Father    Heart disease Father    Heart disease Sister    Other Daughter        positive for lynch syndrome   Other Daughter     Breast cancer Paternal Aunt 83   Breast cancer Cousin    Hypertension Brother    Hypertension Brother    Hypertension Brother    Hypertension Brother     Social History  Socioeconomic History   Marital status: Married    Spouse name: Richardson Landry   Number of children: 2   Years of education: Not on file   Highest education level: Associate degree: occupational, Hotel manager, or vocational program  Occupational History   Occupation: retired    Comment: Control and instrumentation engineer   Occupation: Oceanographer  Tobacco Use   Smoking status: Never   Smokeless tobacco: Never  Vaping Use   Vaping Use: Never used  Substance and Sexual Activity   Alcohol use: No    Alcohol/week: 0.0 standard drinks of alcohol   Drug use: No   Sexual activity: Yes    Partners: Female  Other Topics Concern   Not on file  Social History Narrative   Married   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    Husband is a Ship broker, works on cars, but does not seem to care about their house   Social Determinants of Health   Financial Resource Strain: Low Risk  (02/21/2022)   Overall Financial Resource Strain (CARDIA)    Difficulty of Paying Living Expenses: Not hard at all  Food Insecurity: No Food Insecurity (02/21/2022)   Hunger Vital Sign    Worried About Running Out of Food in the Last Year: Never true    Ran Out of Food in the Last Year: Never true  Transportation Needs: No Transportation Needs (02/21/2022)   PRAPARE - Hydrologist (Medical): No    Lack of Transportation (Non-Medical): No  Physical Activity: Insufficiently Active (02/21/2022)   Exercise Vital Sign    Days of Exercise per Week: 1 day    Minutes of Exercise per Session: 20 min  Stress: No Stress Concern Present (02/21/2022)   [place]    Feeling of Stress : Only a little  Social Connections: Socially Integrated (02/21/2022)   Social Connection and Isolation Panel [NHANES]    Frequency of Communication with Friends and Family: More than three times a week    Frequency of Social Gatherings with Friends and Family: More than three times a week    Attends Religious Services: More than 4 times per year    Active Member of [employer] or Organizations: Yes    Attends Music therapist: More than 4 times per year    Marital Status: Married  Human resources officer Violence: Not At Risk (02/21/2022)   Humiliation, Afraid, Rape, and Kick questionnaire    Fear of Current or Ex-Partner: No    Emotionally Abused: No    Physically Abused: No    Sexually Abused: No     Current Outpatient Medications:    acetaminophen (TYLENOL) 650 MG CR tablet, Take 1 tablet (650 mg total) by mouth as needed for pain., Disp: 30 tablet, Rfl: 0   Cholecalciferol (D3 ADULT PO), Take 1 tablet by mouth daily., Disp: , Rfl:    Coenzyme Q10 (CO Q 10 PO), Take 300 mg by mouth daily., Disp: , Rfl:    hydrALAZINE (APRESOLINE) 10 MG tablet, Take 1 tablet (10 mg total) by mouth 3 (three) times daily. Prn bp above 150/90, Disp: 30 tablet, Rfl: 0   ibuprofen (ADVIL,MOTRIN) 200 MG tablet, Take 200 mg by mouth every 6 (six) hours as needed. Reported on 11/18/2015, Disp: , Rfl:  OVER THE COUNTER MEDICATION, Immunity support vitamin with elderberry, zinc, vitamin C, Disp: , Rfl:    telmisartan-hydrochlorothiazide (MICARDIS HCT) 40-12.5 MG tablet, Take 1 tablet by mouth daily., Disp: 90 tablet, Rfl: 1  No Known Allergies   ROS  Constitutional: Negative for fever or weight change.  Respiratory: Negative for cough and shortness of breath.   Cardiovascular: Negative for chest pain or palpitations.  Gastrointestinal: Negative for abdominal pain, no bowel changes.  Musculoskeletal: Negative for gait problem or joint  swelling.  Skin: Negative for rash.  Neurological: Negative for dizziness or headache.  No other specific complaints in a complete review of systems (except as listed in HPI above).   Objective  Vitals:   02/21/22 1031  BP: 132/72  Pulse: 97  Resp: 14  Temp: 97.9 F (36.6 C)  TempSrc: Oral  SpO2: 100%  Weight: 145 lb 8 oz (66 kg)  Height: '5\' 2"'  (1.575 m)    Body mass index is 26.61 kg/m.  Physical Exam  Constitutional: Patient appears well-developed and well-nourished. No distress.  HENT: Head: Normocephalic and atraumatic. Ears: B TMs ok, no erythema or effusion; Nose: Nose normal. Mouth/Throat: Oropharynx is clear and moist. No oropharyngeal exudate.  Eyes: Conjunctivae and EOM are normal. Pupils are equal, round, and reactive to light. No scleral icterus.  Neck: Normal range of motion. Neck supple. No JVD present. No thyromegaly present.  Cardiovascular: Normal rate, regular rhythm and normal heart sounds.  No murmur heard. No BLE edema. Pulmonary/Chest: Effort normal and breath sounds normal. No respiratory distress. Abdominal: Soft. Bowel sounds are normal, no distension. There is no tenderness. no masses Breast: no lumps or masses, no nipple discharge or rashes FEMALE GENITALIA:  Not done  RECTAL: not done  Musculoskeletal: Normal range of motion, no joint effusions. No gross deformities Neurological: he is alert and oriented to person, place, and time. No cranial nerve deficit. Coordination, balance, strength, speech and gait are normal.  Skin: Skin is warm, dry round patch on right arm.  No erythema.  Psychiatric: Patient has a normal mood and affect. behavior is normal. Judgment and thought content normal.   Recent Results (from the past 2160 hour(s))  Lipid panel     Status: Abnormal   Collection Time: 12/13/21  9:57 AM  Result Value Ref Range   Cholesterol 252 (H) <200 mg/dL   HDL 49 (L) > OR = 50 mg/dL   Triglycerides 209 (H) <150 mg/dL    Comment: . If a  non-fasting specimen was collected, consider repeat triglyceride testing on a fasting specimen if clinically indicated.  Yates Decamp et al. J. of Clin. Lipidol. 5993;5:701-779. Marland Kitchen    LDL Cholesterol (Calc) 166 (H) mg/dL (calc)    Comment: Reference range: <100 . Desirable range <100 mg/dL for primary prevention;   <70 mg/dL for patients with CHD or diabetic patients  with > or = 2 CHD risk factors. Marland Kitchen LDL-C is now calculated using the Martin-Hopkins  calculation, which is a validated novel method providing  better accuracy than the Friedewald equation in the  estimation of LDL-C.  Cresenciano Genre et al. Annamaria Helling. 3903;009(23): 2061-2068  (http://education.[facility].com/faq/FAQ164)    Total CHOL/HDL Ratio 5.1 (H) <5.0 (calc)   Non-HDL Cholesterol (Calc) 203 (H) <130 mg/dL (calc)    Comment: For patients with diabetes plus 1 major ASCVD risk  factor, treating to a non-HDL-C goal of <100 mg/dL  (LDL-C of <70 mg/dL) is considered a therapeutic  option.   COMPLETE METABOLIC PANEL WITH GFR  Status: Abnormal   Collection Time: 12/13/21  9:57 AM  Result Value Ref Range   Glucose, Bld 104 (H) 65 - 99 mg/dL    Comment: .            Fasting reference interval . For someone without known diabetes, a glucose value between 100 and 125 mg/dL is consistent with prediabetes and should be confirmed with a follow-up test. .    BUN 9 7 - 25 mg/dL   Creat 0.71 0.50 - 1.05 mg/dL   eGFR 93 > OR = 60 mL/min/1.88m   BUN/Creatinine Ratio SEE NOTE: 6 - 22 (calc)    Comment:    Not Reported: BUN and Creatinine are within    reference range. .    Sodium 140 135 - 146 mmol/L   Potassium 3.8 3.5 - 5.3 mmol/L   Chloride 104 98 - 110 mmol/L   CO2 28 20 - 32 mmol/L   Calcium 9.6 8.6 - 10.4 mg/dL   Total Protein 7.0 6.1 - 8.1 g/dL   Albumin 4.4 3.6 - 5.1 g/dL   Globulin 2.6 1.9 - 3.7 g/dL (calc)   AG Ratio 1.7 1.0 - 2.5 (calc)   Total Bilirubin 0.4 0.2 - 1.2 mg/dL   Alkaline phosphatase (APISO) 89  37 - 153 U/L   AST 18 10 - 35 U/L   ALT 10 6 - 29 U/L  CBC with Differential/Platelet     Status: None   Collection Time: 12/13/21  9:57 AM  Result Value Ref Range   WBC 6.6 3.8 - 10.8 Thousand/uL   RBC 4.15 3.80 - 5.10 Million/uL   Hemoglobin 13.7 11.7 - 15.5 g/dL   HCT 39.2 35.0 - 45.0 %   MCV 94.5 80.0 - 100.0 fL   MCH 33.0 27.0 - 33.0 pg   MCHC 34.9 32.0 - 36.0 g/dL   RDW 11.8 11.0 - 15.0 %   Platelets 329 140 - 400 Thousand/uL   MPV 9.8 7.5 - 12.5 fL   Neutro Abs 4,343 1,500 - 7,800 cells/uL   Lymphs Abs 1,571 850 - 3,900 cells/uL   Absolute Monocytes 455 200 - 950 cells/uL   Eosinophils Absolute 178 15 - 500 cells/uL   Basophils Absolute 53 0 - 200 cells/uL   Neutrophils Relative % 65.8 %   Total Lymphocyte 23.8 %   Monocytes Relative 6.9 %   Eosinophils Relative 2.7 %   Basophils Relative 0.8 %  Hemoglobin A1c     Status: Abnormal   Collection Time: 12/13/21  9:57 AM  Result Value Ref Range   Hgb A1c MFr Bld 5.8 (H) <5.7 % of total Hgb    Comment: For someone without known diabetes, a hemoglobin  A1c value between 5.7% and 6.4% is consistent with prediabetes and should be confirmed with a  follow-up test. . For someone with known diabetes, a value <7% indicates that their diabetes is well controlled. A1c targets should be individualized based on duration of diabetes, age, comorbid conditions, and other considerations. . This assay result is consistent with an increased risk of diabetes. . Currently, no consensus exists regarding use of hemoglobin A1c for diagnosis of diabetes for children. .    Mean Plasma Glucose 120 mg/dL   eAG (mmol/L) 6.6 mmol/L     Fall Risk:    12/13/2021    8:51 AM 06/14/2021   11:33 AM 01/20/2021   11:01 AM 11/16/2020    7:51 AM 05/10/2020   10:19 AM  Fall Risk  Falls in the past year? 0 0 0 0 0  Number falls in past yr: 0 0 0 0 0  Injury with Fall? 0 0 0 0 0  Risk for fall due to : No Fall Risks No Fall Risks No Fall Risks     Follow up Falls prevention discussed Falls prevention discussed Falls prevention discussed       Functional Status Survey: Is the patient deaf or have difficulty hearing?: No Does the patient have difficulty seeing, even when wearing glasses/contacts?: No Does the patient have difficulty concentrating, remembering, or making decisions?: No Does the patient have difficulty walking or climbing stairs?: No Does the patient have difficulty dressing or bathing?: No Does the patient have difficulty doing errands alone such as visiting a doctor's office or shopping?: No   Assessment & Plan  1. Well adult exam  Discussed importance of increasing physical activity She is also due for eye and dental exams   2. Skin lesion of right arm  - Ambulatory referral to Dermatology    -USPSTF grade A and B recommendations reviewed with patient; age-appropriate recommendations, preventive care, screening tests, etc discussed and encouraged; healthy living encouraged; see AVS for patient education given to patient -Discussed importance of 150 minutes of physical activity weekly, eat two servings of fish weekly, eat one serving of tree nuts ( cashews, pistachios, pecans, almonds.Marland Kitchen) every other day, eat 6 servings of fruit/vegetables daily and drink plenty of water and avoid sweet beverages.   -Reviewed Health Maintenance: Yes.

## 2022-02-20 NOTE — Patient Instructions (Signed)
Preventive Care 65 Years and Older, Female Preventive care refers to lifestyle choices and visits with your health care provider that can promote health and wellness. Preventive care visits are also called wellness exams. What can I expect for my preventive care visit? Counseling Your health care provider may ask you questions about your: Medical history, including: Past medical problems. Family medical history. Pregnancy and menstrual history. History of falls. Current health, including: Memory and ability to understand (cognition). Emotional well-being. Home life and relationship well-being. Sexual activity and sexual health. Lifestyle, including: Alcohol, nicotine or tobacco, and drug use. Access to firearms. Diet, exercise, and sleep habits. Work and work environment. Sunscreen use. Safety issues such as seatbelt and bike helmet use. Physical exam Your health care provider will check your: Height and weight. These may be used to calculate your BMI (body mass index). BMI is a measurement that tells if you are at a healthy weight. Waist circumference. This measures the distance around your waistline. This measurement also tells if you are at a healthy weight and may help predict your risk of certain diseases, such as type 2 diabetes and high blood pressure. Heart rate and blood pressure. Body temperature. Skin for abnormal spots. What immunizations do I need?  Vaccines are usually given at various ages, according to a schedule. Your health care provider will recommend vaccines for you based on your age, medical history, and lifestyle or other factors, such as travel or where you work. What tests do I need? Screening Your health care provider may recommend screening tests for certain conditions. This may include: Lipid and cholesterol levels. Hepatitis C test. Hepatitis B test. HIV (human immunodeficiency virus) test. STI (sexually transmitted infection) testing, if you are at  risk. Lung cancer screening. Colorectal cancer screening. Diabetes screening. This is done by checking your blood sugar (glucose) after you have not eaten for a while (fasting). Mammogram. Talk with your health care provider about how often you should have regular mammograms. BRCA-related cancer screening. This may be done if you have a family history of breast, ovarian, tubal, or peritoneal cancers. Bone density scan. This is done to screen for osteoporosis. Talk with your health care provider about your test results, treatment options, and if necessary, the need for more tests. Follow these instructions at home: Eating and drinking  Eat a diet that includes fresh fruits and vegetables, whole grains, lean protein, and low-fat dairy products. Limit your intake of foods with high amounts of sugar, saturated fats, and salt. Take vitamin and mineral supplements as recommended by your health care provider. Do not drink alcohol if your health care provider tells you not to drink. If you drink alcohol: Limit how much you have to 0-1 drink a day. Know how much alcohol is in your drink. In the [place] one drink equals one 12 oz bottle of beer (355 mL), one 5 oz glass of wine (148 mL), or one 1 oz glass of hard liquor (44 mL). Lifestyle Brush your teeth every morning and night with fluoride toothpaste. Floss one time each day. Exercise for at least 30 minutes 5 or more days each week. Do not use any products that contain nicotine or tobacco. These products include cigarettes, chewing tobacco, and vaping devices, such as e-cigarettes. If you need help quitting, ask your health care provider. Do not use drugs. If you are sexually active, practice safe sex. Use a condom or other form of protection in order to prevent STIs. Take aspirin only as told by   your health care provider. Make sure that you understand how much to take and what form to take. Work with your health care provider to find out whether it  is safe and beneficial for you to take aspirin daily. Ask your health care provider if you need to take a cholesterol-lowering medicine (statin). Find healthy ways to manage stress, such as: Meditation, yoga, or listening to music. Journaling. Talking to a trusted person. Spending time with friends and family. Minimize exposure to UV radiation to reduce your risk of skin cancer. Safety Always wear your seat belt while driving or riding in a vehicle. Do not drive: If you have been drinking alcohol. Do not ride with someone who has been drinking. When you are tired or distracted. While texting. If you have been using any mind-altering substances or drugs. Wear a helmet and other protective equipment during sports activities. If you have firearms in your house, make sure you follow all gun safety procedures. What's next? Visit your health care provider once a year for an annual wellness visit. Ask your health care provider how often you should have your eyes and teeth checked. Stay up to date on all vaccines. This information is not intended to replace advice given to you by your health care provider. Make sure you discuss any questions you have with your health care provider. Document Revised: 10/20/2020 Document Reviewed: 10/20/2020 Elsevier Patient Education  2023 Elsevier Inc.  

## 2022-02-21 ENCOUNTER — Encounter: Payer: Self-pay | Admitting: Family Medicine

## 2022-02-21 ENCOUNTER — Ambulatory Visit (INDEPENDENT_AMBULATORY_CARE_PROVIDER_SITE_OTHER): Payer: Medicare HMO | Admitting: Family Medicine

## 2022-02-21 VITALS — BP 132/72 | HR 97 | Temp 97.9°F | Resp 14 | Ht 62.0 in | Wt 145.5 lb

## 2022-02-21 DIAGNOSIS — L989 Disorder of the skin and subcutaneous tissue, unspecified: Secondary | ICD-10-CM | POA: Diagnosis not present

## 2022-02-21 DIAGNOSIS — Z0000 Encounter for general adult medical examination without abnormal findings: Secondary | ICD-10-CM

## 2022-04-06 ENCOUNTER — Ambulatory Visit
Admission: RE | Admit: 2022-04-06 | Discharge: 2022-04-06 | Disposition: A | Discharge status: Home / Self Care | Payer: Medicare HMO | Source: Ambulatory Visit | Attending: Family Medicine | Admitting: Family Medicine

## 2022-04-06 DIAGNOSIS — Z13820 Encounter for screening for osteoporosis: Secondary | ICD-10-CM | POA: Insufficient documentation

## 2022-04-06 DIAGNOSIS — Z1231 Encounter for screening mammogram for malignant neoplasm of breast: Secondary | ICD-10-CM | POA: Diagnosis not present

## 2022-04-06 DIAGNOSIS — M85852 Other specified disorders of bone density and structure, left thigh: Secondary | ICD-10-CM | POA: Diagnosis not present

## 2022-04-06 DIAGNOSIS — Z780 Asymptomatic menopausal state: Secondary | ICD-10-CM | POA: Insufficient documentation

## 2022-06-09 ENCOUNTER — Other Ambulatory Visit: Payer: Self-pay

## 2022-06-09 ENCOUNTER — Ambulatory Visit: Payer: Self-pay

## 2022-06-09 ENCOUNTER — Encounter: Payer: Self-pay | Admitting: Family Medicine

## 2022-06-09 ENCOUNTER — Ambulatory Visit (INDEPENDENT_AMBULATORY_CARE_PROVIDER_SITE_OTHER): Payer: Medicare HMO | Admitting: Family Medicine

## 2022-06-09 ENCOUNTER — Emergency Department: Payer: Medicare HMO

## 2022-06-09 ENCOUNTER — Emergency Department
Admission: EM | Admit: 2022-06-09 | Discharge: 2022-06-09 | Disposition: A | Discharge status: Home / Self Care | Payer: Medicare HMO | Attending: Emergency Medicine | Admitting: Emergency Medicine

## 2022-06-09 VITALS — BP 126/68 | HR 107 | Resp 16 | Ht 62.0 in | Wt 146.0 lb

## 2022-06-09 DIAGNOSIS — R55 Syncope and collapse: Secondary | ICD-10-CM | POA: Diagnosis not present

## 2022-06-09 DIAGNOSIS — B349 Viral infection, unspecified: Secondary | ICD-10-CM | POA: Diagnosis not present

## 2022-06-09 DIAGNOSIS — B9789 Other viral agents as the cause of diseases classified elsewhere: Secondary | ICD-10-CM | POA: Diagnosis not present

## 2022-06-09 DIAGNOSIS — J069 Acute upper respiratory infection, unspecified: Secondary | ICD-10-CM

## 2022-06-09 DIAGNOSIS — R0789 Other chest pain: Secondary | ICD-10-CM | POA: Diagnosis not present

## 2022-06-09 DIAGNOSIS — R9431 Abnormal electrocardiogram [ECG] [EKG]: Secondary | ICD-10-CM | POA: Diagnosis not present

## 2022-06-09 DIAGNOSIS — R059 Cough, unspecified: Secondary | ICD-10-CM | POA: Diagnosis not present

## 2022-06-09 DIAGNOSIS — R079 Chest pain, unspecified: Secondary | ICD-10-CM | POA: Diagnosis not present

## 2022-06-09 LAB — BASIC METABOLIC PANEL
Anion gap: 10 (ref 5–15)
BUN: 17 mg/dL (ref 8–23)
CO2: 27 mmol/L (ref 22–32)
Calcium: 9.5 mg/dL (ref 8.9–10.3)
Chloride: 101 mmol/L (ref 98–111)
Creatinine, Ser: 0.84 mg/dL (ref 0.44–1.00)
GFR, Estimated: >60 mL/min (ref >60)
Glucose, Bld: 135 mg/dL — ABNORMAL HIGH (ref 70–99)
Potassium: 3.3 mmol/L — ABNORMAL LOW (ref 3.5–5.1)
Sodium: 138 mmol/L (ref 135–145)

## 2022-06-09 LAB — CBC
HCT: 41 % (ref 36.0–46.0)
Hemoglobin: 14.4 g/dL (ref 12.0–15.0)
MCH: 32.7 pg (ref 26.0–34.0)
MCHC: 35.1 g/dL (ref 30.0–36.0)
MCV: 93.2 fL (ref 80.0–100.0)
Platelets: 291 10*3/uL (ref 150–400)
RBC: 4.4 MIL/uL (ref 3.87–5.11)
RDW: 11.5 % (ref 11.5–15.5)
WBC: 8.5 10*3/uL (ref 4.0–10.5)
nRBC: 0 % (ref 0.0–0.2)

## 2022-06-09 LAB — TROPONIN I (HIGH SENSITIVITY): Troponin I (High Sensitivity): 8 ng/L (ref <18)

## 2022-06-09 NOTE — Telephone Encounter (Signed)
    Chief Complaint: Fainted this morning at home, while sitting on bed. No injury. BP 97/69 then, BP 1 hiu Symptoms: Above Frequency: Today Pertinent Negatives: Patient denies fever Disposition: [] ED /[] Urgent Care (no appt availability in office) / [x] Appointment(In office/virtual)/ []  [facility]/ [] Home Care/ [] Refused Recommended Disposition /[] [facility]/ []  Follow-up with PCP Additional Notes:    Reason for Disposition  [9] Systolic BP [id number] AND [8] taking blood pressure medications AND [3] dizzy, lightheaded or weak  Answer Assessment - Initial Assessment Questions 1. BLOOD PRESSURE: "What is the blood pressure?" "Did you take at least two measurements 5 minutes apart?"     97////69  109/74 2. ONSET: "When did you take your blood pressure?"     This morning 3. HOW: "How did you obtain the blood pressure?" (e.g., visiting nurse, automatic home BP monitor)     Home cuff 4. HISTORY: "Do you have a history of low blood pressure?" "What is your blood pressure normally?"     Yes 5. MEDICINES: "Are you taking any medications for blood pressure?" If Yes, ask: "Have they been changed recently?"     YES 6. PULSE RATE: "Do you know what your pulse rate is?"      77 7. OTHER SYMPTOMS: "Have you been sick recently?" "Have you had a recent injury?"     Cough 8. PREGNANCY: "Is there any chance you are pregnant?" "When was your last menstrual period?"     No  Protocols used: Blood Pressure - Low-A-AH

## 2022-06-09 NOTE — ED Provider Notes (Signed)
[facility] Provider Note    Event Date/Time   First MD Initiated Contact with Patient 06/09/22 1640     (approximate)   History   Chest Pain (Last night )   HPI  Jodi Fernandez is a 67 y.o. female  who presents to the emergency department today at the recommendation of primary care because of chest pain and EKG change.  The patient states that for roughly the past week and a half her husband has been sick with viral URI type symptoms.  Patient herself started feeling sick 4 days ago.  She has had cough.  This morning when she was getting up to use the bathroom she noticed some nausea and then she states when she tried to stand up she passed out.  Husband thinks that she was out for maybe 15 to 20 seconds.  She did feel a slight tugging in her chest.  She then called her primary care was able to schedule an appointment for this afternoon.  While there the patient had an EKG done.  There was some concern for T wave changes and so patient was sent to the emergency department.     Physical Exam   Triage Vital Signs: ED Triage Vitals  Enc Vitals Group     BP 06/09/22 1619 (!) 173/90     Pulse Rate 06/09/22 1619 86     Resp 06/09/22 1619 16     Temp 06/09/22 1619 98 F (36.7 C)     Temp Source 06/09/22 1619 Oral     SpO2 06/09/22 1619 98 %     Weight 06/09/22 1611 145 lb 15.1 oz (66.2 kg)     Height 06/09/22 1611 5\' 2"  (1.575 m)     Head Circumference --      Peak Flow --      Pain Score 06/09/22 1611 0     Pain Loc --      Pain Edu? --      Excl. in [place]? --     Most recent vital signs: Vitals:   06/09/22 1619  BP: (!) 173/90  Pulse: 86  Resp: 16  Temp: 98 F (36.7 C)  SpO2: 98%   General: Awake, alert, oriented. CV:  Good peripheral perfusion. Regular rate and rhythm. Resp:  Normal effort. Lungs clear. Abd:  No distention.     ED Results / Procedures / Treatments   Labs (all labs ordered are listed, but only abnormal results are  displayed) Labs Reviewed  BASIC METABOLIC PANEL - Abnormal; Notable for the following components:      Result Value   Potassium 3.3 (*)    Glucose, Bld 135 (*)    All other components within normal limits  CBC  TROPONIN I (HIGH SENSITIVITY)     EKG  I, Nance Pear, attending physician, personally viewed and interpreted this EKG  EKG Time: 1614 Rate: 87 Rhythm: normal sinus rhythm Axis: normal Intervals: qtc 409 QRS: narrow ST changes: no st elevation Impression: normal ekg   RADIOLOGY I independently interpreted and visualized the CXR. My interpretation: No pneumonia.  Radiology interpretation:  IMPRESSION:  No acute cardiopulmonary process.      PROCEDURES:  Critical Care performed: No  Procedures   MEDICATIONS ORDERED IN ED: Medications - No data to display   IMPRESSION / MDM / [place] / ED COURSE  I reviewed the triage vital signs and the nursing notes.  Differential diagnosis includes, but is not limited to, acs, pneumonia, ptx, viral uri  Patient's presentation is most consistent with acute presentation with potential threat to life or bodily function.   Patient presented to the emergency department today from primary care doctor's office because of concerns for some chest pain and changes to her EKG.  EKG here without any concerning ST elevation.  No concerning arrhythmia.  Does appear that her T waves laterally are slightly flattened compared to previous EKG that was performed roughly 3-1/2 years ago.  Blood work without elevation of troponin.  I would expect some elevation of the troponin if the event was ACS given that it occurred number hours ago.  Given that is negative I think this is reassuring.  Chest x-ray without any pneumonia.  I do think she is likely suffering from viral URI.  I did offer to perform COVID/RSV/flu swab however patient declined.  Additionally offered to prescribe cough medication which  the patient declined.   FINAL CLINICAL IMPRESSION(S) / ED DIAGNOSES   Final diagnoses:  Viral URI     Note:  This document was prepared using Dragon voice recognition software and may include unintentional dictation errors.    Nance Pear, MD 06/09/22 [phone]

## 2022-06-09 NOTE — ED Triage Notes (Signed)
Pt to ED from PCP and sent over for low BP and possible changes on EKG. Pt has no complaints at this time. Pt is ambulatory in triage. EKG in hand from PCP.

## 2022-06-09 NOTE — ED Triage Notes (Signed)
First nurse note: Pt sent ER by Dr. Joaquim Lai for MI rule out. Md stating has  been feeling clammy, near-syncope, decreased BP and abnormal EKG.

## 2022-06-09 NOTE — Discharge Instructions (Signed)
Please seek medical attention for any high fevers, chest pain, shortness of breath, change in behavior, persistent vomiting, bloody stool or any other new or concerning symptoms.  

## 2022-06-09 NOTE — Progress Notes (Signed)
Name: Jodi Fernandez   MRN: [id number]    DOB: Nov 23, 1955   Date:06/09/2022       Progress Note  Subjective  Chief Complaint  Syncope/ Hypotension  HPI  Viral illness: she states husband has been sick for two weeks, went to the doctor and COVID-19 negative. She developed symptoms 5 days ago, initially with sneezing , scratchy throat, followed by a dry cough , she also has clear rhinorrhea. This morning she had mild sputum, ting of white . No fever, chills or body aches.   Syncope: she states this morning when she woke up around 6 am and felt like she had to get up and void. She felt nauseated , had some anterior chest pain and clammy , she sat on the side of the bed and before she got up , she fell backwards, her husband in bed and reached his hand and realized she was laying across the bed, got up and called her name. She voided during the syncopal episode. She recalls all the events leading to the syncope. She was able to go downstairs and check bp , husband drove her here today. She did not take her bp medication since BP was low 97-109/66-74 this morning.   She states in the past BP was well controlled at home.    Patient Active Problem List   Diagnosis Date Noted   Trochanteric bursitis, left hip 12/13/2021   Pre-diabetes 12/13/2021   White coat syndrome with diagnosis of hypertension 02/05/2019   Osteopenia after menopause 01/22/2019   Conductive hearing loss 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 11/14/2017   Bilateral tinnitus 11/14/2017   Tachycardia, paroxysmal ([place]) 07/25/2016   GAD (generalized anxiety disorder) 11/11/2014   Insomnia, persistent 11/11/2014   Dyspareunia 11/11/2014   Generalized headache 11/11/2014   Bursitis, trochanteric 11/11/2014   History of Rocky Mountain spotted fever 04/11/2014   Dyslipidemia 10/29/2006   Benign hypertension 10/29/2006   Allergic rhinitis 10/29/2006    Past Surgical History:  Procedure Laterality Date   COLONOSCOPY      TUBAL LIGATION  1987    Family History  Problem Relation Age of Onset   Heart disease Mother    Diabetes Mother    Lesch-Nyhan syndrome Mother    Alcohol abuse Father    Heart disease Father    Heart disease Sister    Other Daughter        positive for lynch syndrome   Other Daughter    Breast cancer Paternal Aunt 33   Breast cancer Cousin    Hypertension Brother    Hypertension Brother    Hypertension Brother    Hypertension Brother     Social History   Tobacco Use   Smoking status: Never   Smokeless tobacco: Never  Substance Use Topics   Alcohol use: No    Alcohol/week: 0.0 standard drinks of alcohol     Current Outpatient Medications:    Cholecalciferol (D3 ADULT PO), Take 1 tablet by mouth daily., Disp: , Rfl:    Coenzyme Q10 (CO Q 10 PO), Take 300 mg by mouth daily., Disp: , Rfl:    hydrALAZINE (APRESOLINE) 10 MG tablet, Take 1 tablet (10 mg total) by mouth 3 (three) times daily. Prn bp above 150/90, Disp: 30 tablet, Rfl: 0   ibuprofen (ADVIL,MOTRIN) 200 MG tablet, Take 200 mg by mouth every 6 (six) hours as needed. Reported on 11/18/2015, Disp: , Rfl:    OVER THE COUNTER MEDICATION, Immunity support vitamin  with elderberry, zinc, vitamin C, Disp: , Rfl:    telmisartan-hydrochlorothiazide (MICARDIS HCT) 40-12.5 MG tablet, Take 1 tablet by mouth daily., Disp: 90 tablet, Rfl: 1   acetaminophen (TYLENOL) 650 MG CR tablet, Take 1 tablet (650 mg total) by mouth as needed for pain. (Patient not taking: Reported on 06/09/2022), Disp: 30 tablet, Rfl: 0  No Known Allergies  I personally reviewed active problem list, medication list, allergies, family history, social history, health maintenance with the patient/caregiver today.   ROS  Ten systems reviewed and is negative except as mentioned in HPI  Objective  Vitals:   06/09/22 1442  BP: 126/68  Pulse: (!) 107  Resp: 16  SpO2: 97%  Weight: 146 lb (66.2 kg)  Height: 5\' 2"  (1.575 m)    Body mass index is 26.7  kg/m.  Physical Exam  Constitutional: Patient appears well-developed and well-nourished.  No distress.  HEENT: head atraumatic, normocephalic, pupils equal and reactive to light, ears normal TM, neck supple. Cardiovascular: Normal rate, regular rhythm and normal heart sounds.  No murmur heard. No BLE edema. Pulmonary/Chest: Effort normal and breath sounds normal. No respiratory distress. Abdominal: Soft.  There is no tenderness. Normal bowel sounds  Psychiatric: Patient has a normal mood and affect. behavior is normal. Judgment and thought content normal.  Neuro: normal cranial nerves, normal sensation, Romberg negative   PHQ2/9:    06/09/2022    2:39 PM 02/21/2022   10:21 AM 01/24/2022   10:34 AM 12/13/2021    8:51 AM 06/14/2021   11:33 AM  Depression screen PHQ 2/9  Decreased Interest 1 0 0 0 0  Down, Depressed, Hopeless 0 0 0 0 0  PHQ - 2 Score 1 0 0 0 0  Altered sleeping 0 0 0 0 0  Tired, decreased energy 0 0 0 0 0  Change in appetite 0 0 0 0 0  Feeling bad or failure about yourself  0 0 0 0 0  Trouble concentrating 0 0 0 0 0  Moving slowly or fidgety/restless 0 0 0 0 0  Suicidal thoughts 0 0 0 0 0  PHQ-9 Score 1 0 0 0 0    phq 9 is negative   Fall Risk:    06/09/2022    2:39 PM 12/13/2021    8:51 AM 06/14/2021   11:33 AM 01/20/2021   11:01 AM 11/16/2020    7:51 AM  Fall Risk   Falls in the past year? 0 0 0 0 0  Number falls in past yr: 0 0 0 0 0  Injury with Fall? 0 0 0 0 0  Risk for fall due to : No Fall Risks No Fall Risks No Fall Risks No Fall Risks   Follow up Falls prevention discussed Falls prevention discussed Falls prevention discussed Falls prevention discussed       Functional Status Survey: Is the patient deaf or have difficulty hearing?: Yes Does the patient have difficulty seeing, even when wearing glasses/contacts?: No Does the patient have difficulty concentrating, remembering, or making decisions?: No Does the patient have difficulty walking or  climbing stairs?: No Does the patient have difficulty dressing or bathing?: No Does the patient have difficulty doing errands alone such as visiting a doctor's office or shopping?: No    Assessment & Plan  1. Syncope, unspecified syncope type  - EKG 12-Lead  2. Viral illness  Going on for 5 days, continue hydration and otc medications   3. EKG abnormalities  T wave abnormalities on  lateral leads, change on her EKG   Explained risk of heart attack, needs serial troponins.   Discussed transport by EMS but patient refused, husband will drive her there now

## 2022-06-11 DIAGNOSIS — J069 Acute upper respiratory infection, unspecified: Secondary | ICD-10-CM | POA: Diagnosis not present

## 2022-06-11 DIAGNOSIS — R051 Acute cough: Secondary | ICD-10-CM | POA: Diagnosis not present

## 2022-06-11 DIAGNOSIS — I10 Essential (primary) hypertension: Secondary | ICD-10-CM | POA: Diagnosis not present

## 2022-06-11 DIAGNOSIS — Z20822 Contact with and (suspected) exposure to covid-19: Secondary | ICD-10-CM | POA: Diagnosis not present

## 2022-06-14 NOTE — Progress Notes (Unsigned)
Name: Jodi Fernandez   MRN: [id number]    DOB: July 25, 1955   Date:06/15/2022       Progress Note  Subjective  Chief Complaint  Follow Up  HPI  HTN with white coat hypertension: :she had an episode of syncope on 06/09/2022, when she first got up associated with substernal chest pain and nausea, BP was very low that morning. When she came in that afternoon there was an EKG change and we sent her to [facility], troponin was negative and she was sent home . BP since that day spiked for about 48 hours and she resumed her medication bp is at goal since. No chest pain, SOB or dizziness since 06/09/2022  Viral illness: started about 2 weeks ago, husband was sick the week before. She is feeling better, she still has a cough but is improving, no SOB but still feels weaker than usual.     Pre-diabetes: last hgbA1C was 5.7 % and stable She denies polyphagia, polydipsia or polyuria.    Hyperlipidemia: She stopped taking statin therapy on her own, she states it caused joint aches. She states symptoms improved once she stopped statins .  The 10-year ASCVD risk score (Arnett DK, et al., 2019) is: 12.2%   Values used to calculate the score:     Age: 67 years     Sex: Female     Is Non-Hispanic African American: No     Diabetic: No     Tobacco smoker: No     Systolic Blood Pressure: 409 mmHg     Is BP treated: Yes     HDL Cholesterol: 49 mg/dL     Total Cholesterol: 252 mg/dL    GAD and dysthymia: there is a family history of depression ( mother and sister) she also worries about her husbands health, has CAD, history of TIA, also DM and chronic pain , he is now very sick with URI   Osteopenia: discussed high calcium diet and vitamin D supplementation . Unchanged    Patient Active Problem List   Diagnosis Date Noted   Trochanteric bursitis, left hip 12/13/2021   Pre-diabetes 12/13/2021   White coat syndrome with diagnosis of hypertension 02/05/2019   Osteopenia after menopause 01/22/2019   Conductive  hearing loss 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 11/14/2017   Bilateral tinnitus 11/14/2017   Tachycardia, paroxysmal ([place]) 07/25/2016   GAD (generalized anxiety disorder) 11/11/2014   Insomnia, persistent 11/11/2014   Dyspareunia 11/11/2014   Generalized headache 11/11/2014   Bursitis, trochanteric 11/11/2014   History of Rocky Mountain spotted fever 04/11/2014   Dyslipidemia 10/29/2006   Benign hypertension 10/29/2006   Allergic rhinitis 10/29/2006    Past Surgical History:  Procedure Laterality Date   COLONOSCOPY     TUBAL LIGATION  1987    Family History  Problem Relation Age of Onset   Heart disease Mother    Diabetes Mother    Lesch-Nyhan syndrome Mother    Alcohol abuse Father    Heart disease Father    Heart disease Sister    Other Daughter        positive for lynch syndrome   Other Daughter    Breast cancer Paternal Aunt 79   Breast cancer Cousin    Hypertension Brother    Hypertension Brother    Hypertension Brother    Hypertension Brother     Social History   Tobacco Use   Smoking status: Never   Smokeless tobacco: Never  Substance Use Topics  Alcohol use: No    Alcohol/week: 0.0 standard drinks of alcohol     Current Outpatient Medications:    Bempedoic Acid-Ezetimibe (NEXLIZET) 180-10 MG TABS, Take 1 tablet by mouth daily at 12 noon., Disp: 90 tablet, Rfl: 1   Cholecalciferol (D3 ADULT PO), Take 1 tablet by mouth daily., Disp: , Rfl:    Coenzyme Q10 (CO Q 10 PO), Take 300 mg by mouth daily., Disp: , Rfl:    ibuprofen (ADVIL,MOTRIN) 200 MG tablet, Take 200 mg by mouth every 6 (six) hours as needed. Reported on 11/18/2015, Disp: , Rfl:    OVER THE COUNTER MEDICATION, Immunity support vitamin with elderberry, zinc, vitamin C, Disp: , Rfl:    telmisartan-hydrochlorothiazide (MICARDIS HCT) 40-12.5 MG tablet, Take 1 tablet by mouth daily., Disp: 90 tablet, Rfl: 1   hydrALAZINE (APRESOLINE) 10 MG tablet, Take 1 tablet (10 mg total) by mouth  3 (three) times daily. Prn bp above 150/90, Disp: 30 tablet, Rfl: 0  No Known Allergies  I personally reviewed active problem list, medication list, allergies, family history, social history, health maintenance with the patient/caregiver today.   ROS  Constitutional: Negative for fever or weight change.  Respiratory: Negative for cough and shortness of breath.   Cardiovascular: Negative for chest pain or palpitations.  Gastrointestinal: Negative for abdominal pain, no bowel changes.  Musculoskeletal: Negative for gait problem or joint swelling.  Skin: Negative for rash.  Neurological: Negative for dizziness or headache.  No other specific complaints in a complete review of systems (except as listed in HPI above).   Objective  Vitals:   06/15/22 0842  BP: 132/82  Pulse: 92  Resp: 14  Temp: 97.8 F (36.6 C)  TempSrc: Oral  SpO2: 100%  Weight: 145 lb 9.6 oz (66 kg)  Height: 5\' 2"  (1.575 m)    Body mass index is 26.63 kg/m.  Physical Exam  Constitutional: Patient appears well-developed and well-nourished.  No distress.  HEENT: head atraumatic, normocephalic, pupils equal and reactive to light, neck supple Cardiovascular: Normal rate, regular rhythm and normal heart sounds.  No murmur heard. No BLE edema. Pulmonary/Chest: Effort normal and breath sounds normal. No respiratory distress. Abdominal: Soft.  There is no tenderness. Psychiatric: Patient has a normal mood and affect. behavior is normal. Judgment and thought content normal.   Recent Results (from the past 2160 hour(s))  Basic metabolic panel     Status: Abnormal   Collection Time: 06/09/22  4:15 PM  Result Value Ref Range   Sodium 138 135 - 145 mmol/L   Potassium 3.3 (L) 3.5 - 5.1 mmol/L   Chloride 101 98 - 111 mmol/L   CO2 27 22 - 32 mmol/L   Glucose, Bld 135 (H) 70 - 99 mg/dL    Comment: Glucose reference range applies only to samples taken after fasting for at least 8 hours.   BUN 17 8 - 23 mg/dL    Creatinine, Ser 0.84 0.44 - 1.00 mg/dL   Calcium 9.5 8.9 - 10.3 mg/dL   GFR, Estimated >60 >60 mL/min    Comment: (NOTE) Calculated using the CKD-EPI Creatinine Equation (2021)    Anion gap 10 5 - 15    Comment: Performed at [facility], [address] [place], [place] [address]  CBC     Status: None   Collection Time: 06/09/22  4:15 PM  Result Value Ref Range   WBC 8.5 4.0 - 10.5 K/uL   RBC 4.40 3.87 - 5.11 MIL/uL   Hemoglobin 14.4  12.0 - 15.0 g/dL   HCT 41.0 36.0 - 46.0 %   MCV 93.2 80.0 - 100.0 fL   MCH 32.7 26.0 - 34.0 pg   MCHC 35.1 30.0 - 36.0 g/dL   RDW 11.5 11.5 - 15.5 %   Platelets 291 150 - 400 K/uL   nRBC 0.0 0.0 - 0.2 %    Comment: Performed at [facility], [address], [place], [place] [address]  Troponin I (High Sensitivity)     Status: None   Collection Time: 06/09/22  4:15 PM  Result Value Ref Range   Troponin I (High Sensitivity) 8 <18 ng/L    Comment: (NOTE) Elevated high sensitivity troponin I (hsTnI) values and significant  changes across serial measurements may suggest ACS but many other  chronic and acute conditions are known to elevate hsTnI results.  Refer to the "Links" section for chest pain algorithms and additional  guidance. Performed at [facility], [address] [place], [place] [address]     PHQ2/9:    06/15/2022    8:50 AM 06/09/2022    2:39 PM 02/21/2022   10:21 AM 01/24/2022   10:34 AM 12/13/2021    8:51 AM  Depression screen PHQ 2/9  Decreased Interest 1 1 0 0 0  Down, Depressed, Hopeless 0 0 0 0 0  PHQ - 2 Score 1 1 0 0 0  Altered sleeping 0 0 0 0 0  Tired, decreased energy 0 0 0 0 0  Change in appetite 0 0 0 0 0  Feeling bad or failure about yourself  0 0 0 0 0  Trouble concentrating 0 0 0 0 0  Moving slowly or fidgety/restless 0 0 0 0 0  Suicidal thoughts 0 0 0 0 0  PHQ-9 Score 1 1 0 0 0  Difficult doing work/chores Not difficult at all        phq 9 is negative   Fall Risk:     06/15/2022    8:50 AM 06/09/2022    2:39 PM 12/13/2021    8:51 AM 06/14/2021   11:33 AM 01/20/2021   11:01 AM  Fall Risk   Falls in the past year? 0 0 0 0 0  Number falls in past yr:  0 0 0 0  Injury with Fall?  0 0 0 0  Risk for fall due to : No Fall Risks No Fall Risks No Fall Risks No Fall Risks No Fall Risks  Follow up Falls prevention discussed Falls prevention discussed Falls prevention discussed Falls prevention discussed Falls prevention discussed    Assessment & Plan  1. Dyslipidemia  - Bempedoic Acid-Ezetimibe (NEXLIZET) 180-10 MG TABS; Take 1 tablet by mouth daily at 12 noon.  Dispense: 90 tablet; Refill: 1  2. White coat syndrome with diagnosis of hypertension  - hydrALAZINE (APRESOLINE) 10 MG tablet; Take 1 tablet (10 mg total) by mouth 3 (three) times daily. Prn bp above 150/90  Dispense: 30 tablet; Refill: 0  3. GAD (generalized anxiety disorder)  Increased lately due to feeling sick and recent visit [facility]  4. Pre-diabetes  Reviewed low carb diet   5. Osteopenia after menopause    6. Tachycardia, paroxysmal ([facility])  Doing well at this time  7. Statin myopathy  We will try Nexlizet   8. Post-viral cough syndrome  Improving, normal exam, reassurance given

## 2022-06-15 ENCOUNTER — Ambulatory Visit (INDEPENDENT_AMBULATORY_CARE_PROVIDER_SITE_OTHER): Payer: Medicare HMO | Admitting: Family Medicine

## 2022-06-15 ENCOUNTER — Encounter: Payer: Self-pay | Admitting: Family Medicine

## 2022-06-15 VITALS — BP 132/82 | HR 92 | Temp 97.8°F | Resp 14 | Ht 62.0 in | Wt 145.6 lb

## 2022-06-15 DIAGNOSIS — R69 Illness, unspecified: Secondary | ICD-10-CM | POA: Diagnosis not present

## 2022-06-15 DIAGNOSIS — R058 Other specified cough: Secondary | ICD-10-CM

## 2022-06-15 DIAGNOSIS — E785 Hyperlipidemia, unspecified: Secondary | ICD-10-CM | POA: Diagnosis not present

## 2022-06-15 DIAGNOSIS — G720 Drug-induced myopathy: Secondary | ICD-10-CM

## 2022-06-15 DIAGNOSIS — I10 Essential (primary) hypertension: Secondary | ICD-10-CM

## 2022-06-15 DIAGNOSIS — T466X5A Adverse effect of antihyperlipidemic and antiarteriosclerotic drugs, initial encounter: Secondary | ICD-10-CM | POA: Diagnosis not present

## 2022-06-15 DIAGNOSIS — R7303 Prediabetes: Secondary | ICD-10-CM | POA: Diagnosis not present

## 2022-06-15 DIAGNOSIS — F411 Generalized anxiety disorder: Secondary | ICD-10-CM

## 2022-06-15 DIAGNOSIS — Z780 Asymptomatic menopausal state: Secondary | ICD-10-CM

## 2022-06-15 DIAGNOSIS — I479 Paroxysmal tachycardia, unspecified: Secondary | ICD-10-CM

## 2022-06-15 DIAGNOSIS — M8580 Other specified disorders of bone density and structure, unspecified site: Secondary | ICD-10-CM | POA: Diagnosis not present

## 2022-06-15 MED ORDER — HYDRALAZINE HCL 10 MG PO TABS: 10.0000 mg | ORAL_TABLET | Freq: Three times a day (TID) | ORAL | 0 refills | Status: DC

## 2022-06-15 MED ORDER — NEXLIZET 180-10 MG PO TABS: 1.0000 | ORAL_TABLET | Freq: Every day | ORAL | 1 refills | Status: DC

## 2022-06-22 ENCOUNTER — Telehealth: Payer: Self-pay | Admitting: Family Medicine

## 2022-06-22 NOTE — Telephone Encounter (Signed)
Pt stated pharmacy advised her they reached out to Dr.Sowles to advise her that medication Bempedoic Acid-Ezetimibe (NEXLIZET) 180-10 MG TABS  needs prior authorization. Pt stated that if this is not approved, she cannot afford the medication.  Pt stated she wonders if she should continue trying try natural remedy of doTERRA oils, which she started today, or go back to her previous medication, rosuvastatin.  Please advise.

## 2022-06-23 NOTE — Telephone Encounter (Signed)
PA in progress. 

## 2022-09-05 ENCOUNTER — Telehealth: Payer: Self-pay | Admitting: Family Medicine

## 2022-09-05 NOTE — Telephone Encounter (Signed)
Contacted [name] to schedule their annual wellness visit. Appointment made for 10/05/2022.  [facility] Care Guide [facility] AWV TEAM Direct Dial: [phone]

## 2022-09-14 NOTE — Progress Notes (Signed)
Name: Jodi Fernandez   MRN: [id number]    DOB: 13-May-1955   Date:09/15/2022       Progress Note  Subjective  Chief Complaint  Follow Up  HPI  HTN with white coat hypertension: :she had an episode of syncope on 06/09/2022, when she first got up associated with substernal chest pain and nausea, BP was very low that morning. When she came in that afternoon there was an EKG change and we sent her to [facility] troponin was negative and she was sent home . BP since that day spiked for about 48 hours and she resumed her medication bp is at goal since. No chest pain, SOB or dizziness since 06/09/2022, she is taking Micardis hctz and occasionally has to take Hydralazine prn when bp spikes at home. She states bp seems to spike at home ( that is a change ) and also has episodes of palpitation, no longer having syncope but is now willing to see cardiologist     Pre-diabetes: last hgbA1C was 5.7 % and stable She denies polyphagia, polydipsia or polyuria.    Hyperlipidemia: She stopped taking statin  due to myalgia , we sent Nexlizet to her pharmacy in Feb but denied by insurance and we will try Zetia now and recheck labs next visit   The 10-year ASCVD risk score (Arnett DK, et al., 2019) is: 11.2%   Values used to calculate the score:     Age: 67 years     Sex: Female     Is Non-Hispanic African American: No     Diabetic: No     Tobacco smoker: No     Systolic Blood Pressure: 126 mmHg     Is BP treated: Yes     HDL Cholesterol: 49 mg/dL     Total Cholesterol: 252 mg/dL    GAD and dysthymia: there is a family history of depression ( mother and sister) she also worries about her husbands health, has CAD, history of TIA, also DM and chronic pain. Unchanged   Osteopenia: discussed high calcium diet and vitamin D supplementation .Unchanged   Patient Active Problem List   Diagnosis Date Noted   Trochanteric bursitis, left hip 12/13/2021   Pre-diabetes 12/13/2021   White coat syndrome with diagnosis of  hypertension 02/05/2019   Osteopenia after menopause 01/22/2019   Conductive hearing loss 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 11/14/2017   Bilateral tinnitus 11/14/2017   Tachycardia, paroxysmal (HCC) 07/25/2016   GAD (generalized anxiety disorder) 11/11/2014   Insomnia, persistent 11/11/2014   Dyspareunia 11/11/2014   Generalized headache 11/11/2014   Bursitis, trochanteric 11/11/2014   History of [place] spotted fever 04/11/2014   Dyslipidemia 10/29/2006   Benign hypertension 10/29/2006   Allergic rhinitis 10/29/2006    Past Surgical History:  Procedure Laterality Date   COLONOSCOPY     TUBAL LIGATION  1987    Family History  Problem Relation Age of Onset   Heart disease Mother    Diabetes Mother    Lesch-Nyhan syndrome Mother    Alcohol abuse Father    Heart disease Father    Heart disease Sister    Other Daughter        positive for lynch syndrome   Other Daughter    Breast cancer Paternal Aunt 2   Breast cancer Cousin    Hypertension Brother    Hypertension Brother    Hypertension Brother    Hypertension Brother     Social History   Tobacco  Use   Smoking status: Never   Smokeless tobacco: Never  Substance Use Topics   Alcohol use: No    Alcohol/week: 0.0 standard drinks of alcohol     Current Outpatient Medications:    Cholecalciferol (D3 ADULT PO), Take 1 tablet by mouth daily., Disp: , Rfl:    Coenzyme Q10 (CO Q 10 PO), Take 300 mg by mouth daily., Disp: , Rfl:    hydrALAZINE (APRESOLINE) 10 MG tablet, Take 1 tablet (10 mg total) by mouth 3 (three) times daily. Prn bp above 150/90, Disp: 30 tablet, Rfl: 0   ibuprofen (ADVIL,MOTRIN) 200 MG tablet, Take 200 mg by mouth every 6 (six) hours as needed. Reported on 11/18/2015, Disp: , Rfl:    OVER THE COUNTER MEDICATION, Immunity support vitamin with elderberry, zinc, vitamin C, Disp: , Rfl:    ezetimibe (ZETIA) 10 MG tablet, Take 1 tablet (10 mg total) by mouth daily., Disp: 90 tablet,  Rfl: 1   telmisartan-hydrochlorothiazide (MICARDIS HCT) 40-12.5 MG tablet, Take 1 tablet by mouth daily., Disp: 90 tablet, Rfl: 1  Allergies  Allergen Reactions   Statins     Myalgia     I personally reviewed active problem list, medication list, allergies, family history, social history, health maintenance with the patient/caregiver today.   ROS  Constitutional: Negative for fever or weight change.  Respiratory: Negative for cough and shortness of breath.   Cardiovascular: Negative for chest pain or palpitations.  Gastrointestinal: Negative for abdominal pain, no bowel changes.  Musculoskeletal: Negative for gait problem or joint swelling.  Skin: Negative for rash.  Neurological: Negative for dizziness or headache.  No other specific complaints in a complete review of systems (except as listed in HPI above).   Objective  Vitals:   09/15/22 0853  BP: 126/80  Pulse: 90  Resp: 14  Temp: 97.8 F (36.6 C)  TempSrc: Oral  SpO2: 96%  Weight: 145 lb 14.4 oz (66.2 kg)  Height: 5\' 2"  (1.575 m)    Body mass index is 26.69 kg/m.  Physical Exam  Constitutional: Patient appears well-developed and well-nourished. No distress.  HEENT: head atraumatic, normocephalic, pupils equal and reactive to light, neck supple Cardiovascular: Normal rate, regular rhythm and normal heart sounds.  No murmur heard. No BLE edema. Pulmonary/Chest: Effort normal and breath sounds normal. No respiratory distress. Abdominal: Soft.  There is no tenderness. Psychiatric: Patient has a normal mood and affect. behavior is normal. Judgment and thought content normal.    PHQ2/9:    09/15/2022    8:52 AM 06/15/2022    8:50 AM 06/09/2022    2:39 PM 02/21/2022   10:21 AM 01/24/2022   10:34 AM  Depression screen PHQ 2/9  Decreased Interest 0 1 1 0 0  Down, Depressed, Hopeless 0 0 0 0 0  PHQ - 2 Score 0 1 1 0 0  Altered sleeping 1 0 0 0 0  Tired, decreased energy 1 0 0 0 0  Change in appetite 0 0 0 0 0   Feeling bad or failure about yourself  0 0 0 0 0  Trouble concentrating 0 0 0 0 0  Moving slowly or fidgety/restless 0 0 0 0 0  Suicidal thoughts 0 0 0 0 0  PHQ-9 Score 2 1 1  0 0  Difficult doing work/chores Not difficult at all Not difficult at all       phq 9 is negative   Fall Risk:    09/15/2022    8:52 AM 06/15/2022  8:50 AM 06/09/2022    2:39 PM 12/13/2021    8:51 AM 06/14/2021   11:33 AM  Fall Risk   Falls in the past year? 0 0 0 0 0  Number falls in past yr:   0 0 0  Injury with Fall?   0 0 0  Risk for fall due to : No Fall Risks No Fall Risks No Fall Risks No Fall Risks No Fall Risks  Follow up Falls prevention discussed;Education provided;Falls evaluation completed Falls prevention discussed Falls prevention discussed Falls prevention discussed Falls prevention discussed    Assessment & Plan  1. Dyslipidemia  - ezetimibe (ZETIA) 10 MG tablet; Take 1 tablet (10 mg total) by mouth daily.  Dispense: 90 tablet; Refill: 1  2. White coat syndrome with diagnosis of hypertension  - telmisartan-hydrochlorothiazide (MICARDIS HCT) 40-12.5 MG tablet; Take 1 tablet by mouth daily.  Dispense: 90 tablet; Refill: 1 - Ambulatory referral to Cardiology  3. Pre-diabetes  On low carb diet   4. Tachycardia, paroxysmal (HCC)  - Ambulatory referral to Cardiology  5. GAD (generalized anxiety disorder)  Stable  6. Statin myopathy  We will try zetia   7. History of syncope  - Ambulatory referral to Cardiology

## 2022-09-15 ENCOUNTER — Ambulatory Visit (INDEPENDENT_AMBULATORY_CARE_PROVIDER_SITE_OTHER): Payer: Medicare HMO | Admitting: Family Medicine

## 2022-09-15 ENCOUNTER — Encounter: Payer: Self-pay | Admitting: Family Medicine

## 2022-09-15 VITALS — BP 126/80 | HR 90 | Temp 97.8°F | Resp 14 | Ht 62.0 in | Wt 145.9 lb

## 2022-09-15 DIAGNOSIS — Z87898 Personal history of other specified conditions: Secondary | ICD-10-CM

## 2022-09-15 DIAGNOSIS — I10 Essential (primary) hypertension: Secondary | ICD-10-CM | POA: Diagnosis not present

## 2022-09-15 DIAGNOSIS — R7303 Prediabetes: Secondary | ICD-10-CM

## 2022-09-15 DIAGNOSIS — F411 Generalized anxiety disorder: Secondary | ICD-10-CM

## 2022-09-15 DIAGNOSIS — T466X5A Adverse effect of antihyperlipidemic and antiarteriosclerotic drugs, initial encounter: Secondary | ICD-10-CM | POA: Diagnosis not present

## 2022-09-15 DIAGNOSIS — E785 Hyperlipidemia, unspecified: Secondary | ICD-10-CM | POA: Diagnosis not present

## 2022-09-15 DIAGNOSIS — G720 Drug-induced myopathy: Secondary | ICD-10-CM

## 2022-09-15 DIAGNOSIS — I479 Paroxysmal tachycardia, unspecified: Secondary | ICD-10-CM | POA: Diagnosis not present

## 2022-09-15 MED ORDER — TELMISARTAN-HCTZ 40-12.5 MG PO TABS: 1.0000 | ORAL_TABLET | Freq: Every day | ORAL | 1 refills | Status: DC

## 2022-09-15 MED ORDER — EZETIMIBE 10 MG PO TABS: 10.0000 mg | ORAL_TABLET | Freq: Every day | ORAL | 1 refills | Status: DC

## 2022-10-11 ENCOUNTER — Encounter: Payer: Self-pay | Admitting: Dermatology

## 2022-10-11 ENCOUNTER — Ambulatory Visit (INDEPENDENT_AMBULATORY_CARE_PROVIDER_SITE_OTHER): Payer: Medicare HMO | Admitting: Dermatology

## 2022-10-11 VITALS — BP 145/91 | HR 77

## 2022-10-11 DIAGNOSIS — S30861A Insect bite (nonvenomous) of abdominal wall, initial encounter: Secondary | ICD-10-CM

## 2022-10-11 DIAGNOSIS — W57XXXA Bitten or stung by nonvenomous insect and other nonvenomous arthropods, initial encounter: Secondary | ICD-10-CM | POA: Diagnosis not present

## 2022-10-11 DIAGNOSIS — L821 Other seborrheic keratosis: Secondary | ICD-10-CM | POA: Diagnosis not present

## 2022-10-11 DIAGNOSIS — S20162A Insect bite (nonvenomous) of breast, left breast, initial encounter: Secondary | ICD-10-CM | POA: Diagnosis not present

## 2022-10-11 DIAGNOSIS — W908XXA Exposure to other nonionizing radiation, initial encounter: Secondary | ICD-10-CM

## 2022-10-11 DIAGNOSIS — L578 Other skin changes due to chronic exposure to nonionizing radiation: Secondary | ICD-10-CM | POA: Diagnosis not present

## 2022-10-11 DIAGNOSIS — X32XXXA Exposure to sunlight, initial encounter: Secondary | ICD-10-CM | POA: Diagnosis not present

## 2022-10-11 MED ORDER — DOXYCYCLINE MONOHYDRATE 100 MG PO CAPS: 100.0000 mg | ORAL_CAPSULE | Freq: Two times a day (BID) | ORAL | 0 refills | Status: AC

## 2022-10-11 MED ORDER — MOMETASONE FUROATE 0.1 % EX CREA: TOPICAL_CREAM | CUTANEOUS | 0 refills | Status: DC

## 2022-10-11 NOTE — Progress Notes (Signed)
   Follow-Up Visit   Subjective  Jodi Fernandez is a 67 y.o. female who presents for the following: new patient reports 2 scaly spots at right forearm, 1 spot at right shoulder and possible bug bite at left breast she would like checked.   States was walking her daughter's dog 2 nights ago and walked through spider webs, felt something sting her. Area since then has been red and itchy.    The following portions of the chart were reviewed this encounter and updated as appropriate: medications, allergies, medical history  Review of Systems:  No other skin or systemic complaints except as noted in HPI or Assessment and Plan.  Objective  Well appearing patient in no apparent distress; mood and affect are within normal limits.   A focused examination was performed of the following areas: right forearm, right shoulder and left breast   Relevant exam findings are noted in the Assessment and Plan.    Assessment & Plan   Bite reaction at side of left breast  Exam: 10 cm pink patch surrounding brighter pink smaller patch on left breast  Hx of bite while walking pet 2 nights ago, Has been itchy  Treatment Plan:  Start doxycycline 100 mg capsule by mouth bid with food for 2 weeks or can discontinue if clear after 1 week.   Doxycycline should be taken with food to prevent nausea. Do not lay down for 30 minutes after taking. Be cautious with sun exposure and use good sun protection while on this medication. Pregnant women should not take this medication.    Start mometasone cream apply twice daily to itchy rash for 2 weeks or until clear.   .Topical steroids (such as triamcinolone, fluocinolone, fluocinonide, mometasone, clobetasol, halobetasol, betamethasone, hydrocortisone) can cause thinning and lightening of the skin if they are used for too long in the same area. Your physician has selected the right strength medicine for your problem and area affected on the body. Please use your  medication only as directed by your physician to prevent side effects.     SEBORRHEIC KERATOSIS Right forearm x 2, right medial shoulder x 1 - Stuck-on, waxy, tan-brown papules and/or plaques  - Benign-appearing - Discussed benign etiology and prognosis. - Observe,  Discussed cryotherapy if spot(s) become irritated or inflamed.  - Call for any changes   Insect bite of abdomen with local reaction, initial encounter  Related Medications doxycycline (MONODOX) 100 MG capsule Take 1 capsule (100 mg total) by mouth 2 (two) times daily for 14 days. Take with food  mometasone (ELOCON) 0.1 % cream Apply topically to itchy rash bid for up to 2 weeks or until clear  ACTINIC DAMAGE - chronic, secondary to cumulative UV radiation exposure/sun exposure over time - diffuse scaly erythematous macules with underlying dyspigmentation - Recommend daily broad spectrum sunscreen SPF 30+ to sun-exposed areas, reapply every 2 hours as needed.  - Recommend staying in the shade or wearing long sleeves, sun glasses (UVA+UVB protection) and wide brim hats (4-inch brim around the entire circumference of the hat). - Call for new or changing lesions.   Return if symptoms worsen or fail to improve.  I, Asher Muir, CMA, am acting as scribe for Willeen Niece, MD.   Documentation: I have reviewed the above documentation for accuracy and completeness, and I agree with the above.  Willeen Niece, MD

## 2022-10-11 NOTE — Patient Instructions (Addendum)
Bite reaction    Start doxycycline 100 mg capsule by mouth twice daily with food for 2 weeks. If clear after a week could discontinue   Doxycycline should be taken with food to prevent nausea. Do not lay down for 30 minutes after taking. Be cautious with sun exposure and use good sun protection while on this medication. Pregnant women should not take this medication.   Start mometasone cream - apply topically to itchy rash twice daily for 2 weeks or until clear.   Topical steroids (such as triamcinolone, fluocinolone, fluocinonide, mometasone, clobetasol, halobetasol, betamethasone, hydrocortisone) can cause thinning and lightening of the skin if they are used for too long in the same area. Your physician has selected the right strength medicine for your problem and area affected on the body. Please use your medication only as directed by your physician to prevent side effects.    Seborrheic Keratosis  What causes seborrheic keratoses? Seborrheic keratoses are harmless, common skin growths that first appear during adult life.  As time goes by, more growths appear.  Some people may develop a large number of them.  Seborrheic keratoses appear on both covered and uncovered body parts.  They are not caused by sunlight.  The tendency to develop seborrheic keratoses can be inherited.  They vary in color from skin-colored to gray, brown, or even black.  They can be either smooth or have a rough, warty surface.   Seborrheic keratoses are superficial and look as if they were stuck on the skin.  Under the microscope this type of keratosis looks like layers upon layers of skin.  That is why at times the top layer may seem to fall off, but the rest of the growth remains and re-grows.    Treatment Seborrheic keratoses do not need to be treated, but can easily be removed in the office.  Seborrheic keratoses often cause symptoms when they rub on clothing or jewelry.  Lesions can be in the way of shaving.  If  they become inflamed, they can cause itching, soreness, or burning.  Removal of a seborrheic keratosis can be accomplished by freezing, burning, or surgery. If any spot bleeds, scabs, or grows rapidly, please return to have it checked, as these can be an indication of a skin cancer.     Due to recent changes in healthcare laws, you may see results of your pathology and/or laboratory studies on MyChart before the doctors have had a chance to review them. We understand that in some cases there may be results that are confusing or concerning to you. Please understand that not all results are received at the same time and often the doctors may need to interpret multiple results in order to provide you with the best plan of care or course of treatment. Therefore, we ask that you please give [place] 2 business days to thoroughly review all your results before contacting the office for clarification. Should we see a critical lab result, you will be contacted sooner.   If You Need Anything After Your Visit  If you have any questions or concerns for your doctor, please call our main line at [phone] and press option 4 to reach your doctor's medical assistant. If no one answers, please leave a voicemail as directed and we will return your call as soon as possible. Messages left after 4 pm will be answered the following business day.   You may also send [place] a message via MyChart. We typically respond to MyChart messages within  1-2 business days.  For prescription refills, please ask your pharmacy to contact our office. Our fax number is [phone].  If you have an urgent issue when the clinic is closed that cannot wait until the next business day, you can page your doctor at the number below.    Please note that while we do our best to be available for urgent issues outside of office hours, we are not available 24/7.   If you have an urgent issue and are unable to reach [place], you may choose to seek medical  care at your doctor's office, retail clinic, urgent care center, or emergency room.  If you have a medical emergency, please immediately call 911 or go to the emergency department.  Pager Numbers  - Dr. Gwen Pounds: [phone]  - Dr. Neale Burly: [phone]  - Dr. Roseanne Reno: [phone]  In the event of inclement weather, please call our main line at [phone] for an update on the status of any delays or closures.  Dermatology Medication Tips: Please keep the boxes that topical medications come in in order to help keep track of the instructions about where and how to use these. Pharmacies typically print the medication instructions only on the boxes and not directly on the medication tubes.   If your medication is too expensive, please contact our office at [phone] option 4 or send [place] a message through MyChart.   We are unable to tell what your co-pay for medications will be in advance as this is different depending on your insurance coverage. However, we may be able to find a substitute medication at lower cost or fill out paperwork to get insurance to cover a needed medication.   If a prior authorization is required to get your medication covered by your insurance company, please allow [place] 1-2 business days to complete this process.  Drug prices often vary depending on where the prescription is filled and some pharmacies may offer cheaper prices.  The website www.goodrx.com contains coupons for medications through different pharmacies. The prices here do not account for what the cost may be with help from insurance (it may be cheaper with your insurance), but the website can give you the price if you did not use any insurance.  - You can print the associated coupon and take it with your prescription to the pharmacy.  - You may also stop by our office during regular business hours and pick up a GoodRx coupon card.  - If you need your prescription sent electronically to a different  pharmacy, notify our office through [facility] or by phone at [phone] option 4.     Si Usted Necesita Algo Despus de Su Visita  Tambin puede enviarnos un mensaje a travs de Clinical cytogeneticist. Por lo general respondemos a los mensajes de MyChart en el transcurso de 1 a 2 das hbiles.  Para renovar recetas, por favor pida a su farmacia que se ponga en contacto con nuestra oficina. Annie Sable de fax es [place] [phone].  Si tiene un asunto urgente cuando la clnica est cerrada y que no puede esperar hasta el siguiente da hbil, puede llamar/localizar a su doctor(a) al nmero que aparece a continuacin.   Por favor, tenga en cuenta que aunque hacemos todo lo posible para estar disponibles para asuntos urgentes fuera del horario de [place], no estamos disponibles las 24 horas del da, los 7 [address]  [address] de la [place].   Si tiene un problema urgente y no puede comunicarse con nosotros, puede optar por buscar  atencin mdica  en el consultorio de su doctor(a), en una clnica privada, en un centro de atencin urgente o en una sala de emergencias.  Si tiene Engineer, drilling, por favor llame inmediatamente al 911 o vaya a la sala de emergencias.  Nmeros de bper  - Dr. Gwen Pounds: [phone]  - Dra. Moye: [phone]  - Dra. Roseanne Reno: [phone]  En caso de inclemencias del [place], por favor llame a Lacy Duverney principal al [phone] para una actualizacin sobre el [place] de cualquier retraso o cierre.  Consejos para la medicacin en dermatologa: Por favor, guarde las cajas en las que vienen los medicamentos de uso tpico para ayudarle a seguir las instrucciones sobre dnde y cmo usarlos. Las farmacias generalmente imprimen las instrucciones del medicamento slo en las cajas y no directamente en los tubos del [place].   Si su medicamento es muy caro, por favor, pngase en contacto con Rolm Gala llamando al [phone] y presione la opcin 4 o envenos un mensaje a  travs de Clinical cytogeneticist.   No podemos decirle cul ser su copago por los medicamentos por adelantado ya que esto es diferente dependiendo de la cobertura de su seguro. Sin embargo, es posible que podamos encontrar un medicamento sustituto a Audiological scientist un formulario para que el seguro cubra el medicamento que se considera necesario.   Si se requiere una autorizacin previa para que su compaa de seguros [place] su medicamento, por favor permtanos de 1 a 2 das hbiles para completar [address]  Los precios de los medicamentos varan con frecuencia dependiendo del Environmental consultant de dnde se surte la receta y alguna farmacias pueden ofrecer precios ms baratos.  El sitio web www.goodrx.com tiene cupones para medicamentos de Health and safety inspector. Los precios aqu no tienen en cuenta lo que podra costar con la ayuda del seguro (puede ser ms barato con su seguro), pero el sitio web puede darle el precio si no utiliz Tourist information centre manager.  - Puede imprimir el cupn correspondiente y llevarlo con su receta a la farmacia.  - Tambin puede pasar por nuestra oficina durante el horario de atencin regular y Education officer, museum una tarjeta de cupones de GoodRx.  - Si necesita que su receta se enve electrnicamente a una farmacia diferente, informe a nuestra oficina a travs de MyChart de [place] o por telfono llamando al [phone] y presione la opcin 4.

## 2022-10-16 DIAGNOSIS — I10 Essential (primary) hypertension: Secondary | ICD-10-CM | POA: Diagnosis not present

## 2022-10-16 DIAGNOSIS — R55 Syncope and collapse: Secondary | ICD-10-CM | POA: Diagnosis not present

## 2022-12-05 ENCOUNTER — Ambulatory Visit: Payer: Medicare HMO | Admitting: Dermatology

## 2023-01-24 ENCOUNTER — Other Ambulatory Visit: Payer: Self-pay | Admitting: Family Medicine

## 2023-01-24 DIAGNOSIS — I10 Essential (primary) hypertension: Secondary | ICD-10-CM

## 2023-01-24 DIAGNOSIS — E785 Hyperlipidemia, unspecified: Secondary | ICD-10-CM

## 2023-02-01 ENCOUNTER — Ambulatory Visit: Payer: Medicare HMO

## 2023-02-01 VITALS — Ht 62.0 in | Wt 145.0 lb

## 2023-02-01 DIAGNOSIS — Z0000 Encounter for general adult medical examination without abnormal findings: Secondary | ICD-10-CM | POA: Diagnosis not present

## 2023-02-01 DIAGNOSIS — Z1231 Encounter for screening mammogram for malignant neoplasm of breast: Secondary | ICD-10-CM

## 2023-02-01 NOTE — Patient Instructions (Signed)
Jodi Fernandez , Thank you for taking time to come for your Medicare Wellness Visit. I appreciate your ongoing commitment to your health goals. Please review the following plan we discussed and let me know if I can assist you in the future.   Referrals/Orders/Follow-Ups/Clinician Recommendations:   You have an order for:  []   2D Mammogram  [x]   3D Mammogram  []   Bone Density     Please call for appointment:  [facility]  [address] [place] [address] [phone]  [facility] [address] [place], [place] [address] [phone]  [facility] [address] [place], [place] [address] [phone]    Make sure to wear two-piece clothing.  No lotions, powders, or deodorants the day of the appointment. Make sure to bring picture ID and insurance card.  Bring list of medications you are currently taking including any supplements.   Schedule your [place] screening mammogram through MyChart!   Log into your MyChart account.  Go to 'Visit' (or 'Appointments' if on mobile App) --> Schedule an Appointment  Under 'Select a Reason for Visit' choose the Mammogram Screening option.  Complete the pre-visit questions and select the time and place that best fits your schedule.    This is a list of the screening recommended for you and due dates:  Health Maintenance  Topic Date Due   COVID-19 Vaccine (3 - Pfizer risk series) 08/13/2019   Flu Shot  12/07/2022   Mammogram  04/07/2023   Medicare Annual Wellness Visit  02/01/2024   Colon Cancer Screening  08/12/2026   DTaP/Tdap/Td vaccine (3 - Td or Tdap) 06/06/2028   Pneumonia Vaccine  Completed   DEXA scan (bone density measurement)  Completed   Hepatitis C Screening  Completed   Zoster (Shingles) Vaccine  Completed   HPV Vaccine  Aged Out    Advanced directives: (Copy Requested) Please  bring a copy of your health care power of attorney and living will to the office to be added to your chart at your convenience.  Next Medicare Annual Wellness Visit scheduled for next year: Yes 04/08/24 @ 1:40pm telephone

## 2023-02-01 NOTE — Progress Notes (Signed)
Subjective:   Jodi Fernandez is a 67 y.o. female who presents for Medicare Annual (Subsequent) preventive examination.  Visit Complete: Virtual  I connected with  Jodi Fernandez on 02/01/23 by a audio enabled telemedicine application and verified that I am speaking with the correct person using two identifiers.  Patient Location: Home  Provider Location: Home Office  I discussed the limitations of evaluation and management by telemedicine. The patient expressed understanding and agreed to proceed.  Because this visit was a virtual/telehealth visit, some criteria may be missing or patient reported. Any vitals not documented were not able to be obtained and vitals that have been documented are patient reported.   Patient Medicare AWV questionnaire was completed by the patient on (not done); I have confirmed that all information answered by patient is correct and no changes since this date. Cardiac Risk Factors include: advanced age (>90men, >23 women);hypertension;dyslipidemia;sedentary lifestyle    Objective:    Today's Vitals   02/01/23 1303  Weight: 145 lb (65.8 kg)  Height: 5\' 2"  (1.575 m)   Body mass index is 26.52 kg/m.     02/01/2023    1:24 PM 06/09/2022    4:12 PM 01/20/2021   11:00 AM 01/20/2020   10:54 AM 02/20/2017    9:48 AM 11/20/2016    9:22 AM 08/21/2016    8:44 AM  Advanced Directives  Does Patient Have a Medical Advance Directive? No No No No No No No  Would patient like information on creating a medical advance directive?   No - Patient declined Yes (MAU/Ambulatory/Procedural Areas - Information given)       Current Medications (verified) Outpatient Encounter Medications as of 02/01/2023  Medication Sig   Cholecalciferol (D3 ADULT PO) Take 1 tablet by mouth daily.   Coenzyme Q10 (CO Q 10 PO) Take 300 mg by mouth daily.   ezetimibe (ZETIA) 10 MG tablet TAKE 1 TABLET BY MOUTH ONCE DAILY   hydrALAZINE (APRESOLINE) 10 MG tablet Take 1 tablet (10 mg total) by  mouth 3 (three) times daily. Prn bp above 150/90   ibuprofen (ADVIL,MOTRIN) 200 MG tablet Take 200 mg by mouth every 6 (six) hours as needed. Reported on 11/18/2015   OVER THE COUNTER MEDICATION Immunity support vitamin with elderberry, zinc, vitamin C   telmisartan-hydrochlorothiazide (MICARDIS HCT) 40-12.5 MG tablet TAKE 1 TABLET BY MOUTH ONCE DAILY   mometasone (ELOCON) 0.1 % cream Apply topically to itchy rash bid for up to 2 weeks or until clear (Patient not taking: Reported on 02/01/2023)   No facility-administered encounter medications on file as of 02/01/2023.    Allergies (verified) Statins   History: Past Medical History:  Diagnosis Date   Abnormal glucose    Allergy    Generalized headache    Hemorrhoids    Hyperlipidemia    Hypertension    Insomnia    Rocky Mountain spotted fever    Ventricular tachycardia (HCC) 08/11/2016   received documention from [facility] (Dr. Vita Erm)   Past Surgical History:  Procedure Laterality Date   COLONOSCOPY     TUBAL LIGATION  1987   Family History  Problem Relation Age of Onset   Heart disease Mother    Diabetes Mother    Lesch-Nyhan syndrome Mother    Alcohol abuse Father    Heart disease Father    Heart disease Sister    Other Daughter        positive for lynch syndrome   Other Daughter    Breast  cancer Paternal Aunt 15   Breast cancer Cousin    Hypertension Brother    Hypertension Brother    Hypertension Brother    Hypertension Brother    Social History   Socioeconomic History   Marital status: Married    Spouse name: Jodi Fernandez   Number of children: 2   Years of education: Not on file   Highest education level: Associate degree: occupational, Scientist, product/process development, or vocational program  Occupational History   Occupation: retired    Comment: Geologist, engineering   Occupation: Lawyer  Tobacco Use   Smoking status: Never   Smokeless tobacco: Never  Vaping Use   Vaping status: Never Used  Substance and Sexual  Activity   Alcohol use: No    Alcohol/week: 0.0 standard drinks of alcohol   Drug use: No   Sexual activity: Yes    Partners: Female  Other Topics Concern   Not on file  Social History Narrative   Married   Retired Geologist, 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    Husband is a Chartered loss adjuster, works on cars, but does not seem to care about their house   Social Determinants of Health   Financial Resource Strain: Low Risk  (02/01/2023)   Overall Financial Resource Strain (CARDIA)    Difficulty of Paying Living Expenses: Not hard at all  Food Insecurity: No Food Insecurity (02/01/2023)   Hunger Vital Sign    Worried About Running Out of Food in the Last Year: Never true    Ran Out of Food in the Last Year: Never true  Transportation Needs: No Transportation Needs (02/01/2023)   PRAPARE - Administrator, Civil Service (Medical): No    Lack of Transportation (Non-Medical): No  Physical Activity: Inactive (02/01/2023)   Exercise Vital Sign    Days of Exercise per Week: 0 days    Minutes of Exercise per Session: 0 min  Stress: No Stress Concern Present (02/01/2023)   [employer] of Occupational Health - Occupational Stress Questionnaire    Feeling of Stress : Only a little  Social Connections: Socially Integrated (02/01/2023)   Social Connection and Isolation Panel [NHANES]    Frequency of Communication with Friends and Family: More than three times a week    Frequency of Social Gatherings with Friends and Family: More than three times a week    Attends Religious Services: More than 4 times per year    Active Member of [employer] or Organizations: Yes    Attends Engineer, structural: More than 4 times per year    Marital Status: Married    Tobacco Counseling Counseling given: Not Answered   Clinical Intake:  Pre-visit  preparation completed: Yes  Pain : No/denies pain     BMI - recorded: 26.52 Nutritional Status: BMI 25 -29 Overweight Nutritional Risks: None Diabetes: No  How often do you need to have someone help you when you read instructions, pamphlets, or other written materials from your doctor or pharmacy?: 1 - Never  Interpreter Needed?: No  Comments: lives with husband Information entered by :: B.Gawain Crombie,LPN   Activities of Daily Living    02/01/2023    1:24 PM 06/15/2022    8:51 AM  In your present state of health, do you have any difficulty performing the following activities:  Hearing? 1 1  Vision?  0 0  Difficulty concentrating or making decisions? 0 0  Walking or climbing stairs? 0 0  Dressing or bathing? 0 0  Doing errands, shopping? 0 0  Preparing Food and eating ? N   Using the Toilet? N   In the past six months, have you accidently leaked urine? N   Do you have problems with loss of bowel control? N   Managing your Medications? N   Managing your Finances? N   Housekeeping or managing your Housekeeping? N     Patient Care Team: Alba Cory, MD as PCP - General (Family Medicine) Kieth Brightly, MD (General Surgery) Vita Erm, MD as Consulting Physician (Cardiology) Linus Salmons, MD (Otolaryngology) Theodore Demark, Alden Benjamin, MD as Referring Physician (Gastroenterology) Isla Pence, OD (Optometry)  Indicate any recent Medical Services you may have received from other than [facility] providers in the past year (date may be approximate).     Assessment:   This is a routine wellness examination for Jodi Fernandez.  Hearing/Vision screen Hearing Screening - Comments:: Ringing in both ears:some hearing loss Vision Screening - Comments:: Pt says she wears readers only; can see alright Dr Clydene Pugh   Goals Addressed   None    Depression Screen    02/01/2023    1:33 PM 09/15/2022    8:52 AM 06/15/2022    8:50 AM 06/09/2022    2:39 PM 02/21/2022   10:21 AM  01/24/2022   10:34 AM 12/13/2021    8:51 AM  PHQ 2/9 Scores  PHQ - 2 Score 0 0 1 1 0 0 0  PHQ- 9 Score  2 1 1  0 0 0    Fall Risk    02/01/2023    1:12 PM 09/15/2022    8:52 AM 06/15/2022    8:50 AM 06/09/2022    2:39 PM 12/13/2021    8:51 AM  Fall Risk   Falls in the past year? 0 0 0 0 0  Number falls in past yr: 0   0 0  Injury with Fall? 0   0 0  Risk for fall due to : No Fall Risks No Fall Risks No Fall Risks No Fall Risks No Fall Risks  Follow up Education provided;Falls prevention discussed Falls prevention discussed;Education provided;Falls evaluation completed Falls prevention discussed Falls prevention discussed Falls prevention discussed    MEDICARE RISK AT HOME: Medicare Risk at Home Any stairs in or around the home?: Yes If so, are there any without handrails?: No Home free of loose throw rugs in walkways, pet beds, electrical cords, etc?: Yes Adequate lighting in your home to reduce risk of falls?: Yes Life alert?: No Use of a cane, walker or w/c?: No Grab bars in the bathroom?: No Shower chair or bench in shower?: No Elevated toilet seat or a handicapped toilet?: No  TIMED UP AND GO:  Was the test performed?  No    Cognitive Function:        02/01/2023    1:29 PM 01/24/2022   10:39 AM  6CIT Screen  What Year? 0 points 0 points  What month? 0 points 0 points  What time? 0 points 0 points  Count back from 20 0 points 0 points  Months in reverse 0 points 0 points  Repeat phrase 0 points 0 points  Total Score 0 points 0 points    Immunizations Immunization History  Administered Date(s) Administered   Fluad Quad(high Dose 65+) 02/05/2019, 05/10/2020   Influenza,inj,Quad PF,6+ Mos 02/20/2013, 03/03/2014, 02/15/2015, 02/20/2017,  02/19/2018   Influenza-Unspecified 03/03/2014   PFIZER(Purple Top)SARS-COV-2 Vaccination 06/19/2019, 07/16/2019   Pneumococcal Conjugate-13 12/24/2018   Pneumococcal Polysaccharide-23 01/20/2020   Tdap 05/08/2010, 06/06/2018   Zoster  Recombinant(Shingrix) 06/06/2018, 12/24/2018   Zoster, Live 02/15/2015    TDAP status: Up to date  Flu Vaccine status: Due, Education has been provided regarding the importance of this vaccine. Advised may receive this vaccine at local pharmacy or Health Dept. Aware to provide a copy of the vaccination record if obtained from local pharmacy or Health Dept. Verbalized acceptance and understanding.  Pneumococcal vaccine status: Up to date  Covid-19 vaccine status: Completed vaccines  Qualifies for Shingles Vaccine? Yes   Zostavax completed Yes   Shingrix Completed?: Yes  Screening Tests Health Maintenance  Topic Date Due   COVID-19 Vaccine (3 - Pfizer risk series) 08/13/2019   INFLUENZA VACCINE  12/07/2022   MAMMOGRAM  04/07/2023   Medicare Annual Wellness (AWV)  02/01/2024   Colonoscopy  08/12/2026   DTaP/Tdap/Td (3 - Td or Tdap) 06/06/2028   Pneumonia Vaccine 66+ Years old  Completed   DEXA SCAN  Completed   Hepatitis C Screening  Completed   Zoster Vaccines- Shingrix  Completed   HPV VACCINES  Aged Out    Health Maintenance  Health Maintenance Due  Topic Date Due   COVID-19 Vaccine (3 - Pfizer risk series) 08/13/2019   INFLUENZA VACCINE  12/07/2022    Colorectal cancer screening: Type of screening: Colonoscopy. Completed yes. Repeat every 5-10 years  Mammogram status: Ordered yes. Pt provided with contact info and advised to call to schedule appt.   Bone Density status: Completed yes. Results reflect: Bone density results: NORMAL. Repeat every 5 years. OSTEOPOROSIS  Lung Cancer Screening: (Low Dose CT Chest recommended if Age 3-80 years, 20 pack-year currently smoking OR have quit w/in 15years.) does not qualify.   Lung Cancer Screening Referral: no  Additional Screening:  Hepatitis C Screening: does not qualify; Completed yes  Vision Screening: Recommended annual ophthalmology exams for early detection of glaucoma and other disorders of the eye. Is the  patient up to date with their annual eye exam?  Yes  Who is the provider or what is the name of the office in which the patient attends annual eye exams? Dr Clydene Pugh If pt is not established with a provider, would they like to be referred to a provider to establish care? No .   Dental Screening: Recommended annual dental exams for proper oral hygiene  Diabetic Foot Exam: N/A  Community Resource Referral / Chronic Care Management: CRR required this visit?  No   CCM required this visit?  No    Plan:     I have personally reviewed and noted the following in the patient's chart:   Medical and social history Use of alcohol, tobacco or illicit drugs  Current medications and supplements including opioid prescriptions. Patient is not currently taking opioid prescriptions. Functional ability and status Nutritional status Physical activity Advanced directives List of other physicians Hospitalizations, surgeries, and ER visits in previous 12 months Vitals Screenings to include cognitive, depression, and falls Referrals and appointments  In addition, I have reviewed and discussed with patient certain preventive protocols, quality metrics, and best practice recommendations. A written personalized care plan for preventive services as well as general preventive health recommendations were provided to patient.     Sue Lush, LPN   1/61/0960   After Visit Summary: (MyChart) Due to this being a telephonic visit, the after visit summary with  patients personalized plan was offered to patient via MyChart   Nurse Notes: The patient states she is doing well and has no concerns or questions at this time.  **MMG DUE in November 24

## 2023-03-13 NOTE — Progress Notes (Unsigned)
Name: Jodi Fernandez   MRN: [id number]    DOB: [birth year]   Date:03/14/2023       Progress Note  Subjective  Chief Complaint  Follow Up  HPI  HTN with white coat hypertension/:paroxysmal tachycardia  :she had an episode of syncope on 06/09/2022, when she first got up associated with substernal chest pain and nausea, BP was very low that morning. When she came in that afternoon there was an EKG change and we sent her to [facility], troponin was negative and she was sent home . She is back on her regular bp medication and is doing well now. She has hydralazine to take prn but states has not been taking it lately     Pre-diabetes: last hgbA1C was 5.7 % and stable She denies polyphagia, polydipsia or polyuria. We will recheck labs today    Hyperlipidemia: She stopped taking statin  due to myalgia , we sent Nexlizet to her pharmacy in Feb but denied by insurance , she is taking Zetia at this time and we will recheck labs   The 10-year ASCVD risk score (Arnett DK, et al., 2019) is: 12.2%   Values used to calculate the score:     Age: 67 years     Sex: Female     Is Non-Hispanic African American: No     Diabetic: No     Tobacco smoker: No     Systolic Blood Pressure: 126 mmHg     Is BP treated: Yes     HDL Cholesterol: 49 mg/dL     Total Cholesterol: 252 mg/dL   GAD and dysthymia: there is a family history of depression ( mother and sister) she also worries about her husbands health, has CAD, history of TIA, also DM and chronic pain. 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 11/14/2017   Bilateral tinnitus 11/14/2017   Tachycardia, paroxysmal (HCC) 07/25/2016   GAD (generalized anxiety disorder) 11/11/2014   Insomnia, persistent 11/11/2014   Dyspareunia 11/11/2014   Generalized headache 11/11/2014   Bursitis, trochanteric 11/11/2014   History of Rocky Mountain spotted fever 04/11/2014   Dyslipidemia 10/29/2006   Benign hypertension 10/29/2006   Allergic rhinitis 10/29/2006    Past Surgical History:  Procedure Laterality Date   COLONOSCOPY     TUBAL LIGATION  1987    Family History  Problem Relation Age of Onset   Heart disease Mother    Diabetes Mother    Lesch-Nyhan syndrome Mother    Alcohol abuse Father    Heart disease Father    Heart disease Sister    Other Daughter        positive for lynch syndrome   Other Daughter    Breast cancer Paternal Aunt 55   Breast cancer Cousin    Hypertension Brother    Hypertension Brother    Hypertension Brother    Hypertension Brother     Social History   Tobacco Use   Smoking status: Never   Smokeless tobacco: Never  Substance Use Topics   Alcohol  use: No    Alcohol/week: 0.0 standard drinks of alcohol     Current Outpatient Medications:    Cholecalciferol (D3 ADULT PO), Take 1 tablet by mouth daily., Disp: , Rfl:    Coenzyme Q10 (CO Q 10 PO), Take 300 mg by mouth daily., Disp: , Rfl:    ezetimibe (ZETIA) 10 MG tablet, TAKE 1 TABLET BY MOUTH ONCE DAILY, Disp: 90 tablet, Rfl: 1   hydrALAZINE (APRESOLINE) 10 MG tablet, Take 1 tablet (10 mg total) by mouth 3 (three) times daily. Prn bp above 150/90, Disp: 30 tablet, Rfl: 0   ibuprofen (ADVIL,MOTRIN) 200 MG tablet, Take 200 mg by mouth every 6 (six) hours as needed. Reported on 11/18/2015, Disp: , Rfl:    OVER THE COUNTER MEDICATION, Immunity support vitamin with elderberry, zinc, vitamin C, Disp: , Rfl:    telmisartan-hydrochlorothiazide (MICARDIS HCT) 40-12.5 MG tablet, TAKE 1 TABLET BY MOUTH ONCE DAILY, Disp: 90  tablet, Rfl: 1  Allergies  Allergen Reactions   Statins     Myalgia     I personally reviewed active problem list, medication list, allergies, family history, social history, health maintenance with the patient/caregiver today.   ROS  Ten systems reviewed and is negative except as mentioned in HPI    Objective  Vitals:   03/14/23 1051  BP: 126/80  Pulse: 83  Resp: 14  Temp: 97.7 F (36.5 C)  TempSrc: Oral  SpO2: 96%  Weight: 146 lb 3.2 oz (66.3 kg)  Height: 5\' 2"  (1.575 m)    Body mass index is 26.74 kg/m.  Physical Exam  Constitutional: Patient appears well-developed and well-nourished. Obese  No distress.  HEENT: head atraumatic, normocephalic, pupils equal and reactive to light, neck supple Cardiovascular: Normal rate, regular rhythm and normal heart sounds.  No murmur heard. No BLE edema. Pulmonary/Chest: Effort normal and breath sounds normal. No respiratory distress. Abdominal: Soft.  There is no tenderness. Psychiatric: Patient has a normal mood and affect. behavior is normal. Judgment and thought content normal.   PHQ2/9:    03/14/2023   10:53 AM 02/01/2023    1:33 PM 09/15/2022    8:52 AM 06/15/2022    8:50 AM 06/09/2022    2:39 PM  Depression screen PHQ 2/9  Decreased Interest 0 0 0 1 1  Down, Depressed, Hopeless 0 0 0 0 0  PHQ - 2 Score 0 0 0 1 1  Altered sleeping 0  1 0 0  Tired, decreased energy 0  1 0 0  Change in appetite 0  0 0 0  Feeling bad or failure about yourself  0  0 0 0  Trouble concentrating 0  0 0 0  Moving slowly or fidgety/restless 0  0 0 0  Suicidal thoughts 0  0 0 0  PHQ-9 Score 0  2 1 1   Difficult doing work/chores   Not difficult at all Not difficult at all     phq 9 is negative   Fall Risk:    03/14/2023   10:53 AM 02/01/2023    1:12 PM 09/15/2022    8:52 AM 06/15/2022    8:50 AM 06/09/2022    2:39 PM  Fall Risk   Falls in the past year? 0 0 0 0 0  Number falls in past yr:  0   0  Injury with Fall?  0   0  Risk for fall  due to : No Fall Risks No Fall Risks No Fall Risks No Fall Risks No Fall  Risks  Follow up Falls prevention discussed Education provided;Falls prevention discussed Falls prevention discussed;Education provided;Falls evaluation completed Falls prevention discussed Falls prevention discussed      Functional Status Survey: Is the patient deaf or have difficulty hearing?: Yes Does the patient have difficulty seeing, even when wearing glasses/contacts?: No Does the patient have difficulty concentrating, remembering, or making decisions?: No Does the patient have difficulty walking or climbing stairs?: No Does the patient have difficulty dressing or bathing?: No Does the patient have difficulty doing errands alone such as visiting a doctor's office or shopping?: No    Assessment & Plan  1. Tachycardia, paroxysmal (HCC)  Doing well now   2. White coat syndrome with diagnosis of hypertension  - CBC with Differential/Platelet - COMPLETE METABOLIC PANEL WITH GFR  3. Osteopenia after menopause  - COMPLETE METABOLIC PANEL WITH GFR - VITAMIN D 25 Hydroxy (Vit-D Deficiency, Fractures)  4. Pre-diabetes  - Hemoglobin A1c  5. Dyslipidemia  - Lipid panel  6. Statin myopathy  On Zetia   7. GAD (generalized anxiety disorder)  Stable  8. Hyperglycemia  - Hemoglobin A1c

## 2023-03-14 ENCOUNTER — Ambulatory Visit (INDEPENDENT_AMBULATORY_CARE_PROVIDER_SITE_OTHER): Payer: Medicare HMO | Admitting: Family Medicine

## 2023-03-14 ENCOUNTER — Encounter: Payer: Self-pay | Admitting: Family Medicine

## 2023-03-14 VITALS — BP 126/80 | HR 83 | Temp 97.7°F | Resp 14 | Ht 62.0 in | Wt 146.2 lb

## 2023-03-14 DIAGNOSIS — Z23 Encounter for immunization: Secondary | ICD-10-CM

## 2023-03-14 DIAGNOSIS — M8580 Other specified disorders of bone density and structure, unspecified site: Secondary | ICD-10-CM

## 2023-03-14 DIAGNOSIS — F411 Generalized anxiety disorder: Secondary | ICD-10-CM | POA: Diagnosis not present

## 2023-03-14 DIAGNOSIS — G720 Drug-induced myopathy: Secondary | ICD-10-CM

## 2023-03-14 DIAGNOSIS — I10 Essential (primary) hypertension: Secondary | ICD-10-CM

## 2023-03-14 DIAGNOSIS — I479 Paroxysmal tachycardia, unspecified: Secondary | ICD-10-CM

## 2023-03-14 DIAGNOSIS — R7303 Prediabetes: Secondary | ICD-10-CM

## 2023-03-14 DIAGNOSIS — R739 Hyperglycemia, unspecified: Secondary | ICD-10-CM | POA: Diagnosis not present

## 2023-03-14 DIAGNOSIS — Z780 Asymptomatic menopausal state: Secondary | ICD-10-CM

## 2023-03-14 DIAGNOSIS — E785 Hyperlipidemia, unspecified: Secondary | ICD-10-CM | POA: Diagnosis not present

## 2023-03-15 LAB — CBC WITH DIFFERENTIAL/PLATELET
Absolute Lymphocytes: 1742 {cells}/uL (ref 850–3900)
Absolute Monocytes: 415 {cells}/uL (ref 200–950)
Basophils Absolute: 40 {cells}/uL (ref 0–200)
Basophils Relative: 0.6 %
Eosinophils Absolute: 241 {cells}/uL (ref 15–500)
Eosinophils Relative: 3.6 %
HCT: 37.6 % (ref 35.0–45.0)
Hemoglobin: 12.9 g/dL (ref 11.7–15.5)
MCH: 33 pg (ref 27.0–33.0)
MCHC: 34.3 g/dL (ref 32.0–36.0)
MCV: 96.2 fL (ref 80.0–100.0)
MPV: 9.7 fL (ref 7.5–12.5)
Monocytes Relative: 6.2 %
Neutro Abs: 4261 {cells}/uL (ref 1500–7800)
Neutrophils Relative %: 63.6 %
Platelets: 340 10*3/uL (ref 140–400)
RBC: 3.91 10*6/uL (ref 3.80–5.10)
RDW: 11.5 % (ref 11.0–15.0)
Total Lymphocyte: 26 %
WBC: 6.7 10*3/uL (ref 3.8–10.8)

## 2023-03-15 LAB — LIPID PANEL
Cholesterol: 229 mg/dL — ABNORMAL HIGH (ref <200)
HDL: 52 mg/dL (ref >=50)
LDL Cholesterol (Calc): 154 mg/dL — ABNORMAL HIGH
Non-HDL Cholesterol (Calc): 177 mg/dL — ABNORMAL HIGH (ref <130)
Total CHOL/HDL Ratio: 4.4 (calc) (ref <5.0)
Triglycerides: 109 mg/dL (ref <150)

## 2023-03-15 LAB — COMPLETE METABOLIC PANEL WITH GFR
AG Ratio: 1.7 (calc) (ref 1.0–2.5)
ALT: 9 U/L (ref 6–29)
AST: 18 U/L (ref 10–35)
Albumin: 4.2 g/dL (ref 3.6–5.1)
Alkaline phosphatase (APISO): 83 U/L (ref 37–153)
BUN: 12 mg/dL (ref 7–25)
CO2: 28 mmol/L (ref 20–32)
Calcium: 9.2 mg/dL (ref 8.6–10.4)
Chloride: 104 mmol/L (ref 98–110)
Creat: 0.72 mg/dL (ref 0.50–1.05)
Globulin: 2.5 g/dL (ref 1.9–3.7)
Glucose, Bld: 109 mg/dL — ABNORMAL HIGH (ref 65–99)
Potassium: 3.9 mmol/L (ref 3.5–5.3)
Sodium: 140 mmol/L (ref 135–146)
Total Bilirubin: 0.5 mg/dL (ref 0.2–1.2)
Total Protein: 6.7 g/dL (ref 6.1–8.1)
eGFR: 90 mL/min/{1.73_m2} (ref >=60)

## 2023-03-15 LAB — HEMOGLOBIN A1C
Hgb A1c MFr Bld: 6.1 %{Hb} — ABNORMAL HIGH (ref <5.7)
Mean Plasma Glucose: 128 mg/dL
eAG (mmol/L): 7.1 mmol/L

## 2023-03-15 LAB — VITAMIN D 25 HYDROXY (VIT D DEFICIENCY, FRACTURES): Vit D, 25-Hydroxy: 44 ng/mL (ref 30–100)

## 2023-03-19 ENCOUNTER — Ambulatory Visit: Payer: Medicare HMO | Admitting: Family Medicine

## 2023-03-21 ENCOUNTER — Ambulatory Visit: Payer: Medicare HMO | Admitting: Family Medicine

## 2023-03-22 ENCOUNTER — Ambulatory Visit: Payer: Medicare HMO | Admitting: Family Medicine

## 2023-06-18 ENCOUNTER — Encounter: Payer: Self-pay | Admitting: Family Medicine

## 2023-06-18 ENCOUNTER — Ambulatory Visit: Payer: Medicare HMO | Admitting: Family Medicine

## 2023-06-18 VITALS — BP 128/82 | HR 72 | Resp 16 | Ht 62.0 in | Wt 144.0 lb

## 2023-06-18 DIAGNOSIS — M79645 Pain in left finger(s): Secondary | ICD-10-CM | POA: Diagnosis not present

## 2023-06-18 DIAGNOSIS — M25532 Pain in left wrist: Secondary | ICD-10-CM

## 2023-06-18 DIAGNOSIS — R21 Rash and other nonspecific skin eruption: Secondary | ICD-10-CM

## 2023-06-18 MED ORDER — TRIAMCINOLONE ACETONIDE 0.1 % EX OINT: 1.0000 | TOPICAL_OINTMENT | Freq: Two times a day (BID) | CUTANEOUS | 0 refills | Status: DC | PRN

## 2023-06-18 NOTE — Progress Notes (Signed)
 Patient ID: Jodi Fernandez, female    DOB: Jun 20, 1955, 68 y.o.   MRN: [id number]  PCP: Arleen Lacer, MD  Chief Complaint  Patient presents with   Rash    X3 weeks. Back and L leg   Wrist Pain    L wrist- since November. Tender to touch, certain movements hurt.    Subjective:   Jodi Fernandez is a 68 y.o. female, presents to clinic with CC of the following:  HPI   Rash and dryness rash to mid center of back and left thigh she tried topical steroids clear calamine topical and doterra oils, overall getting better, it did start after using a body oil, she's changes soaps as well. No hx of eczema though some family members have dx   Left thumb and wrist pain for several weeks after increased activity and multiple things with lifting and repetitive use of left hand and wrist/arm.  She has tried a wrist spint and it helps a little No specific injury, she wonders if she pulled her thumb back once Right hand dominant No swelling, redness bruising    Patient Active Problem List   Diagnosis Date Noted   Trochanteric bursitis, left hip 12/13/2021   Pre-diabetes 12/13/2021   White coat syndrome with diagnosis of hypertension 02/05/2019   Osteopenia after menopause 01/22/2019   Conductive hearing loss 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 11/14/2017   Bilateral tinnitus 11/14/2017   Tachycardia, paroxysmal (HCC) 07/25/2016   GAD (generalized anxiety disorder) 11/11/2014   Insomnia, persistent 11/11/2014   Dyspareunia 11/11/2014   Generalized headache 11/11/2014   Bursitis, trochanteric 11/11/2014   History of [place] spotted fever 04/11/2014   Dyslipidemia 10/29/2006   Benign hypertension 10/29/2006   Allergic rhinitis 10/29/2006      Current Outpatient Medications:    Cholecalciferol (D3 ADULT PO), Take 1 tablet by mouth daily., Disp: , Rfl:    Coenzyme Q10 (CO Q 10 PO), Take 300 mg by mouth daily., Disp: , Rfl:    ezetimibe  (ZETIA ) 10 MG tablet, TAKE 1  TABLET BY MOUTH ONCE DAILY, Disp: 90 tablet, Rfl: 1   hydrALAZINE  (APRESOLINE ) 10 MG tablet, Take 1 tablet (10 mg total) by mouth 3 (three) times daily. Prn bp above 150/90, Disp: 30 tablet, Rfl: 0   ibuprofen (ADVIL,MOTRIN) 200 MG tablet, Take 200 mg by mouth every 6 (six) hours as needed. Reported on 11/18/2015, Disp: , Rfl:    OVER THE COUNTER MEDICATION, Immunity support vitamin with elderberry, zinc, vitamin C, Disp: , Rfl:    telmisartan -hydrochlorothiazide (MICARDIS  HCT) 40-12.5 MG tablet, TAKE 1 TABLET BY MOUTH ONCE DAILY, Disp: 90 tablet, Rfl: 1   Allergies  Allergen Reactions   Statins     Myalgia      Social History   Tobacco Use   Smoking status: Never   Smokeless tobacco: Never  Vaping Use   Vaping status: Never Used  Substance Use Topics   Alcohol use: No    Alcohol/week: 0.0 standard drinks of alcohol   Drug use: No      Chart Review Today: I personally reviewed active problem list, medication list, allergies, family history, social history, health maintenance, notes from last encounter, lab results, imaging with the patient/caregiver today.   Review of Systems  Constitutional: Negative.   HENT: Negative.    Eyes: Negative.   Respiratory: Negative.    Cardiovascular: Negative.   Gastrointestinal: Negative.   Endocrine: Negative.   Genitourinary: Negative.  Musculoskeletal: Negative.   Skin: Negative.   Allergic/Immunologic: Negative.   Neurological: Negative.   Hematological: Negative.   Psychiatric/Behavioral: Negative.    All other systems reviewed and are negative.      Objective:   Vitals:   06/18/23 1121  BP: 128/82  Pulse: 72  Resp: 16  SpO2: 96%  Weight: 144 lb (65.3 kg)  Height: 5\' 2"  (1.575 m)    Body mass index is 26.34 kg/m.  Physical Exam Vitals and nursing note reviewed.  Constitutional:      Appearance: She is well-developed.  HENT:     Head: Normocephalic and atraumatic.     Nose: Nose normal.  Eyes:     General:         Right eye: No discharge.        Left eye: No discharge.     Conjunctiva/sclera: Conjunctivae normal.  Neck:     Trachea: No tracheal deviation.  Cardiovascular:     Rate and Rhythm: Normal rate and regular rhythm.  Pulmonary:     Effort: Pulmonary effort is normal. No respiratory distress.     Breath sounds: No stridor.  Musculoskeletal:        General: Normal range of motion.     Left wrist: Tenderness and bony tenderness present. No swelling, deformity, effusion, snuff box tenderness or crepitus. Normal range of motion. Normal pulse.     Left hand: Tenderness and bony tenderness present. No swelling, deformity or lacerations. Normal range of motion. Normal sensation. There is no disruption of two-point discrimination.     Comments: Left wrist and thumb mild ttp along radial aspect of wrist and up thumb No crepitus, swelling or erythema Neg tinel's and phalens  Skin:    General: Skin is warm and dry.     Findings: Rash (mild erythematous maculopapular rash to mid low back and some scattered to left anterior thigh) present.  Neurological:     Mental Status: She is alert.     Motor: No abnormal muscle tone.     Coordination: Coordination normal.  Psychiatric:        Behavior: Behavior normal.      Results for orders placed or performed in visit on 03/14/23  Lipid panel   Collection Time: 03/14/23 11:13 AM  Result Value Ref Range   Cholesterol 229 (H) <200 mg/dL   HDL 52 > OR = 50 mg/dL   Triglycerides 161 <096 mg/dL   LDL Cholesterol (Calc) 154 (H) mg/dL (calc)   Total CHOL/HDL Ratio 4.4 <5.0 (calc)   Non-HDL Cholesterol (Calc) 177 (H) <130 mg/dL (calc)  CBC with Differential/Platelet   Collection Time: 03/14/23 11:13 AM  Result Value Ref Range   WBC 6.7 3.8 - 10.8 Thousand/uL   RBC 3.91 3.80 - 5.10 Million/uL   Hemoglobin 12.9 11.7 - 15.5 g/dL   HCT 04.5 40.9 - 81.1 %   MCV 96.2 80.0 - 100.0 fL   MCH 33.0 27.0 - 33.0 pg   MCHC 34.3 32.0 - 36.0 g/dL   RDW 91.4  78.2 - 95.6 %   Platelets 340 140 - 400 Thousand/uL   MPV 9.7 7.5 - 12.5 fL   Neutro Abs 4,261 1,500 - 7,800 cells/uL   Absolute Lymphocytes 1,742 850 - 3,900 cells/uL   Absolute Monocytes 415 200 - 950 cells/uL   Eosinophils Absolute 241 15 - 500 cells/uL   Basophils Absolute 40 0 - 200 cells/uL   Neutrophils Relative % 63.6 %   Total Lymphocyte 26.0 %  Monocytes Relative 6.2 %   Eosinophils Relative 3.6 %   Basophils Relative 0.6 %  COMPLETE METABOLIC PANEL WITH GFR   Collection Time: 03/14/23 11:13 AM  Result Value Ref Range   Glucose, Bld 109 (H) 65 - 99 mg/dL   BUN 12 7 - 25 mg/dL   Creat 1.61 0.96 - 0.45 mg/dL   eGFR 90 > OR = 60 WU/JWJ/1.91Y7   BUN/Creatinine Ratio SEE NOTE: 6 - 22 (calc)   Sodium 140 135 - 146 mmol/L   Potassium 3.9 3.5 - 5.3 mmol/L   Chloride 104 98 - 110 mmol/L   CO2 28 20 - 32 mmol/L   Calcium  9.2 8.6 - 10.4 mg/dL   Total Protein 6.7 6.1 - 8.1 g/dL   Albumin 4.2 3.6 - 5.1 g/dL   Globulin 2.5 1.9 - 3.7 g/dL (calc)   AG Ratio 1.7 1.0 - 2.5 (calc)   Total Bilirubin 0.5 0.2 - 1.2 mg/dL   Alkaline phosphatase (APISO) 83 37 - 153 U/L   AST 18 10 - 35 U/L   ALT 9 6 - 29 U/L  Hemoglobin A1c   Collection Time: 03/14/23 11:13 AM  Result Value Ref Range   Hgb A1c MFr Bld 6.1 (H) <5.7 % of total Hgb   Mean Plasma Glucose 128 mg/dL   eAG (mmol/L) 7.1 mmol/L  VITAMIN D  25 Hydroxy (Vit-D Deficiency, Fractures)   Collection Time: 03/14/23 11:13 AM  Result Value Ref Range   Vit D, 25-Hydroxy 44 30 - 100 ng/mL       Assessment & Plan:   1. Rash and nonspecific skin eruption (Primary) Unclear if some atopic dermatitis or contact dermatitis due to a new body oil she tried It is improving with home remedies and OTC hydrocortisone cream Encouraged hydration and can try stronger topical steroid, also advised to start a daily oral antihistamine F/up if any change or worsening At this time rash if faint, mild and reported to be improving, do not feel systemic  steroids are indicated - triamcinolone  ointment (KENALOG ) 0.1 %; Apply 1 Application topically 2 (two) times daily as needed (to affect skin with rash). Not to face or genitals  Dispense: 30 g; Refill: 0  2. Left wrist pain 3. Thumb pain, left Suspect thumb tendonitis - rest with thumb spica splint and if not improving in a few weeks she was instructed to let us  know so we can refer her to ortho.  Good ROM, no swelling, no crepitus - likely from some recent overuse Explained inflamed tendon or ligament may take up to 6 weeks to improve with rest and conservative measures      Adeline Hone, PA-C 06/18/23 11:41 AM

## 2023-06-18 NOTE — Patient Instructions (Addendum)
 Try a thumb spica to rest your left thumb and recommend using for a few weeks and if its not better in a few weeks let us  know and we will refer you to a orthopedic or hand specialist for further evaluation    For rash focus on hydration to dry skin with creams and vaseline For itchy area of rash apply the new prescription steroid Also start a daily antihistamine like zyrtec or claritin  once a day at bedtime  If the rash worsens or spreads you will need to come back for us  to check you

## 2023-07-30 ENCOUNTER — Ambulatory Visit: Payer: Self-pay

## 2023-07-30 NOTE — Telephone Encounter (Signed)
 Copied from CRM [id number]. Topic: Clinical - Red Word Triage >> Jul 30, 2023 11:00 AM Geroge Baseman wrote: Red Word that prompted transfer to Nurse Triage: Patient is having issues wrist hurting and rash is still apparent on the wrist as well. The brace she got at last visit is helping with support but she is still experiencing pain. She does not know how to proceed, doesn't think its broken but something is wrong. The rash is not itching any longer but it is not going away. Antihistamine was suggested but patient doesn't understand how that would help with a  rash please explain.  Chief Complaint: rash, wrist pain Symptoms: persistent but intermittent wrist pain, persistent rash and intermittent itching to lower back and around waist, new similar rash to hairline Frequency: continual Pertinent Negatives: Patient denies worsening rash to lower back/wrist, bright red sunburn like rash, hives, open skin, blisters, worsening wrist pain Disposition: [] 911 / [] ED /[] Urgent Care (no appt availability in office) / [x] Appointment(In office/virtual)/ []  [facility]/ [] Home Care/ [] Refused Recommended Disposition /[] [facility]/ []  Follow-up with PCP Additional Notes: Pt reporting that she has had no improvement with her wrist pain since using the brace advised at last visit, asking if need for ortho referral. Pt also reporting no improvement with rash to lower back and waist, the prescribed ointment helps with itching but still itches, asking if need for dermatology referral. Pt reporting that she now has similar rash to hairline since being at beach. Pt confirms rash to both areas is some redness, some "bumpiness, roughness to it," maybe "raised" and itchy but no blisters. Pt reporting vaseline does help with dry skin but has not taken zyrtec or claritin as recommended by doc at last visit, pt thought was for sneezing, etc and is "very sensitive" to meds, gets "funny feeling, not dizzy but just  different" feeling so avoids taking meds. Advised that pt could try to the zyrtec or claritin as recommended by the doc but call back if concerns. Advised pt be examined again to check on new rash and ensure best next steps with wrist and skin, scheduled for tomorrow am. Pt verbalized understanding and will call back if worsening.  Reason for Disposition  [1] MILD pain (e.g., does not interfere with normal activities) AND [2] present > 7 days  Answer Assessment - Initial Assessment Questions 1. ONSET: "When did the pain start?"     Since November 2. LOCATION: "Where is the pain located?"     wrist 3. PAIN: "How bad is the pain?" (Scale 1-10; or mild, moderate, severe)   - MILD (1-3): doesn't interfere with normal activities   - MODERATE (4-7): interferes with normal activities (e.g., work or school) or awakens from sleep   - SEVERE (8-10): excruciating pain, unable to use hand at all     Okay with brace on, some pain sometimes with brace on, positional, go to take it off same sort of pain been dealing with, going on since November, tried another brace from online, Misty Stanley said if brace with thumb doesn't help will refer to ortho, not worsening but persisting not improved, it's the same  Protocols used: Hand and Wrist Pain-A-AH

## 2023-07-31 ENCOUNTER — Encounter: Payer: Self-pay | Admitting: Internal Medicine

## 2023-07-31 ENCOUNTER — Other Ambulatory Visit: Payer: Self-pay

## 2023-07-31 ENCOUNTER — Ambulatory Visit (INDEPENDENT_AMBULATORY_CARE_PROVIDER_SITE_OTHER): Admitting: Internal Medicine

## 2023-07-31 VITALS — BP 128/84 | HR 86 | Temp 97.9°F | Resp 16 | Ht 62.0 in | Wt 143.8 lb

## 2023-07-31 DIAGNOSIS — M778 Other enthesopathies, not elsewhere classified: Secondary | ICD-10-CM | POA: Diagnosis not present

## 2023-07-31 DIAGNOSIS — Z1231 Encounter for screening mammogram for malignant neoplasm of breast: Secondary | ICD-10-CM | POA: Diagnosis not present

## 2023-07-31 DIAGNOSIS — R21 Rash and other nonspecific skin eruption: Secondary | ICD-10-CM

## 2023-07-31 MED ORDER — NAPROXEN 500 MG PO TABS: 500.0000 mg | ORAL_TABLET | Freq: Two times a day (BID) | ORAL | 0 refills | Status: AC

## 2023-07-31 MED ORDER — TRIAMCINOLONE ACETONIDE 0.1 % EX OINT: 1.0000 | TOPICAL_OINTMENT | Freq: Two times a day (BID) | CUTANEOUS | 1 refills | Status: DC | PRN

## 2023-07-31 NOTE — Progress Notes (Signed)
 Acute Office Visit  Subjective:     Patient ID: Jodi Fernandez, female    DOB: 05/23/1955, 68 y.o.   MRN: [id number]  Chief Complaint  Patient presents with   Wrist Pain   Rash    Lower back, itching   Hair/Scalp Problem    itching    HPI Patient is in today for itching behind neck, scalp and back.  She first noticed this rash in January on her lower back.  Rash is very itchy.  She was seen here about a month ago and was prescribed steroid cream which she states does help the itching and the rash is slowly improving.  She now has it on the back of neck and scalp which is also very itchy.  She has been using baby shampoo and trying to keep her skin moisturized by using Vaseline.  She is concerned about potential [facility] spotted fever, as she has frequent tick bites and has been diagnosed with this in the past.  She denies any recent tick bites that she knows of the yard frequently.  She also has ongoing left wrist pain.  Pain is worse at the base of the thumb and with pronation and supination of the wrist.  This has been going on since November.  She does use a wrist brace which does help control the pain.  She took ibuprofen one time and does not remember much improvement.  Review of Systems  Constitutional:  Negative for chills and fever.  Musculoskeletal:  Positive for joint pain.  Skin:  Positive for itching and rash.        Objective:    BP 128/84 (Cuff Size: Normal)   Pulse 86   Temp 97.9 F (36.6 C) (Oral)   Resp 16   Ht 5\' 2"  (1.575 m)   Wt 143 lb 12.8 oz (65.2 kg)   SpO2 99%   BMI 26.30 kg/m    Physical Exam Constitutional:      Appearance: Normal appearance.  HENT:     Head: Normocephalic and atraumatic.  Eyes:     Conjunctiva/sclera: Conjunctivae normal.  Cardiovascular:     Rate and Rhythm: Normal rate and regular rhythm.  Pulmonary:     Effort: Pulmonary effort is normal.     Breath sounds: Normal breath sounds.  Musculoskeletal:         General: Tenderness present. No swelling.     Comments: Wearing left wrist brace, pain at the base of the thumb on palpation   Skin:    General: Skin is warm and dry.     Findings: Rash present.     Comments: Rash consistent with atopic dermatitis on the back of her neck, erythematous macular rash on the lower back  Neurological:     General: No focal deficit present.     Mental Status: She is alert. Mental status is at baseline.  Psychiatric:        Mood and Affect: Mood normal.        Behavior: Behavior normal.     No results found for any visits on 07/31/23.      Assessment & Plan:   1. Rash and nonspecific skin eruption (Primary): Rash consistent with atopic dermatitis on the back of her neck and scalp.  Patient is worried about [facility] spotted fever, will test antibodies and refill steroid cream.  She was given a sample of nonsteroid Zoryve to try on her neck.  - triamcinolone ointment (KENALOG) 0.1 %;  Apply 1 Application topically 2 (two) times daily as needed (to affect skin with rash). Not to face or genitals  Dispense: 80 g; Refill: 1 - Rickettsia Ab Pnl w/Rfx to Titer  2. Tendonitis of wrist, left: Consistent with tendinitis, can continue wrist brace but recommend scheduled anti-inflammatories, which she is hesitant about.  Will prescribe naproxen 500 mg twice daily x 7 days to reduce inflammation, also discussed with the patient using Voltaren gel for pain relief.  - naproxen (NAPROSYN) 500 MG tablet; Take 1 tablet (500 mg total) by mouth 2 (two) times daily with a meal for 7 days.  Dispense: 14 tablet; Refill: 0  3. Encounter for screening mammogram for malignant neoplasm of breast: Mammogram ordered.   - MM 3D SCREENING MAMMOGRAM BILATERAL BREAST; Future   Return if symptoms worsen or fail to improve.  Margarita Mail, DO

## 2023-07-31 NOTE — Patient Instructions (Addendum)
 It was great seeing you today!  Plan discussed at today's visit: -Blood work ordered today, results will be uploaded to MyChart.  -For rash continue steroid cream on trunk/body and use sample cream on back of neck, can also take Claritin to help with allergic component to rash -For wrist, recommend Naproxen 500 mg twice a day for 1 week, take with food. Can also use over the counter Voltaren gel for pain as well  Follow up in: as needed  Take care and let us know if you have any questions or concerns prior to your next visit.  Dr. Caralee Ates

## 2023-08-03 LAB — RICKETTSIA AB PNL W/RFX TO TITER
RMSF IgG: NOT DETECTED
RMSF IgM: NOT DETECTED
Typhus IgG Antibodies: NOT DETECTED
Typhus IgM Antibodies: NOT DETECTED

## 2023-08-06 ENCOUNTER — Encounter: Payer: Self-pay | Admitting: Internal Medicine

## 2023-08-20 ENCOUNTER — Ambulatory Visit (INDEPENDENT_AMBULATORY_CARE_PROVIDER_SITE_OTHER): Admitting: Dermatology

## 2023-08-20 DIAGNOSIS — R21 Rash and other nonspecific skin eruption: Secondary | ICD-10-CM | POA: Diagnosis not present

## 2023-08-20 MED ORDER — CLOBETASOL PROPIONATE 0.05 % EX CREA: 1.0000 | TOPICAL_CREAM | Freq: Two times a day (BID) | CUTANEOUS | 2 refills | Status: AC

## 2023-08-20 MED ORDER — CLOBETASOL PROPIONATE 0.05 % EX SOLN: 1.0000 | Freq: Two times a day (BID) | CUTANEOUS | 2 refills | Status: AC

## 2023-08-20 NOTE — Patient Instructions (Addendum)
 Use clobetasol cream twice a day for 2 weeks to aa, back and sides.   Topical steroids (such as triamcinolone, fluocinolone, fluocinonide, mometasone, clobetasol, halobetasol, betamethasone, hydrocortisone) can cause thinning and lightening of the skin if they are used for too long in the same area. Your physician has selected the right strength medicine for your problem and area affected on the body. Please use your medication only as directed by your physician to prevent side effects.   Use clobetasol 0.05% solution to scalp, BID.   Recommend OTC CeraVe Anti-Itch moisturizer, can use a couple times a day. Use on top of clobetasol.   Due to recent changes in healthcare laws, you may see results of your pathology and/or laboratory studies on MyChart before the doctors have had a chance to review them. We understand that in some cases there may be results that are confusing or concerning to you. Please understand that not all results are received at the same time and often the doctors may need to interpret multiple results in order to provide you with the best plan of care or course of treatment. Therefore, we ask that you please give [place] 2 business days to thoroughly review all your results before contacting the office for clarification. Should we see a critical lab result, you will be contacted sooner.   If You Need Anything After Your Visit  If you have any questions or concerns for your doctor, please call our main line at [phone] and press option 4 to reach your doctor's medical assistant. If no one answers, please leave a voicemail as directed and we will return your call as soon as possible. Messages left after 4 pm will be answered the following business day.   You may also send [place] a message via MyChart. We typically respond to MyChart messages within 1-2 business days.  For prescription refills, please ask your pharmacy to contact our office. Our fax number is [phone].  If you  have an urgent issue when the clinic is closed that cannot wait until the next business day, you can page your doctor at the number below.    Please note that while we do our best to be available for urgent issues outside of office hours, we are not available 24/7.   If you have an urgent issue and are unable to reach [place], you may choose to seek medical care at your doctor's office, retail clinic, urgent care center, or emergency room.  If you have a medical emergency, please immediately call 911 or go to the emergency department.  Pager Numbers  - Dr. Gwen Pounds: [phone]  - Dr. Roseanne Reno: [phone]  - Dr. Katrinka Blazing: [phone]   In the event of inclement weather, please call our main line at [phone] for an update on the status of any delays or closures.  Dermatology Medication Tips: Please keep the boxes that topical medications come in in order to help keep track of the instructions about where and how to use these. Pharmacies typically print the medication instructions only on the boxes and not directly on the medication tubes.   If your medication is too expensive, please contact our office at [phone] option 4 or send [place] a message through MyChart.   We are unable to tell what your co-pay for medications will be in advance as this is different depending on your insurance coverage. However, we may be able to find a substitute medication at lower cost or fill out paperwork to get insurance to cover a  needed medication.   If a prior authorization is required to get your medication covered by your insurance company, please allow [place] 1-2 business days to complete this process.  Drug prices often vary depending on where the prescription is filled and some pharmacies may offer cheaper prices.  The website www.goodrx.com contains coupons for medications through different pharmacies. The prices here do not account for what the cost may be with help from insurance (it may be cheaper  with your insurance), but the website can give you the price if you did not use any insurance.  - You can print the associated coupon and take it with your prescription to the pharmacy.  - You may also stop by our office during regular business hours and pick up a GoodRx coupon card.  - If you need your prescription sent electronically to a different pharmacy, notify our office through [facility] or by phone at [phone] option 4.     Si Usted Necesita Algo Despus de Su Visita  Tambin puede enviarnos un mensaje a travs de Clinical cytogeneticist. Por lo general respondemos a los mensajes de MyChart en el transcurso de 1 a 2 das hbiles.  Para renovar recetas, por favor pida a su farmacia que se ponga en contacto con nuestra oficina. Annie Sable de fax es [place] [phone].  Si tiene un asunto urgente cuando la clnica est cerrada y que no puede esperar hasta el siguiente da hbil, puede llamar/localizar a su doctor(a) al nmero que aparece a continuacin.   Por favor, tenga en cuenta que aunque hacemos todo lo posible para estar disponibles para asuntos urgentes fuera del horario de [place], no estamos disponibles las 24 horas del da, los 7 [address]  [address] de la [place].   Si tiene un problema urgente y no puede comunicarse con nosotros, puede optar por buscar atencin mdica  en el consultorio de su doctor(a), en una clnica privada, en un centro de atencin urgente o en una sala de emergencias.  Si tiene Engineer, drilling, por favor llame inmediatamente al 911 o vaya a la sala de emergencias.  Nmeros de bper  - Dr. Gwen Pounds: [phone]  - Dra. Roseanne Reno: [phone]  - Dr. Katrinka Blazing: [phone]   En caso de inclemencias del tiempo, por favor llame a Lacy Duverney principal al [phone] para una actualizacin sobre el [place] de cualquier retraso o cierre.  Consejos para la medicacin en dermatologa: Por favor, guarde las cajas en las que vienen los medicamentos de uso tpico para  ayudarle a seguir las instrucciones sobre dnde y cmo usarlos. Las farmacias generalmente imprimen las instrucciones del medicamento slo en las cajas y no directamente en los tubos del [place].   Si su medicamento es muy caro, por favor, pngase en contacto con Rolm Gala llamando al [phone] y presione la opcin 4 o envenos un mensaje a travs de Clinical cytogeneticist.   No podemos decirle cul ser su copago por los medicamentos por adelantado ya que esto es diferente dependiendo de la cobertura de su seguro. Sin embargo, es posible que podamos encontrar un medicamento sustituto a Audiological scientist un formulario para que el seguro cubra el medicamento que se considera necesario.   Si se requiere una autorizacin previa para que su compaa de seguros [place] su medicamento, por favor permtanos de 1 a 2 das hbiles para completar [address]  Los precios de los medicamentos varan con frecuencia dependiendo del Environmental consultant de dnde se surte la receta y alguna farmacias pueden ofrecer precios ms baratos.  El sitio web www.goodrx.com tiene cupones para medicamentos de Health and safety inspector. Los precios aqu no tienen en cuenta lo que podra costar con la ayuda del seguro (puede ser ms barato con su seguro), pero el sitio web puede darle el precio si no utiliz Tourist information centre manager.  - Puede imprimir el cupn correspondiente y llevarlo con su receta a la farmacia.  - Tambin puede pasar por nuestra oficina durante el horario de atencin regular y Education officer, museum una tarjeta de cupones de GoodRx.  - Si necesita que su receta se enve electrnicamente a una farmacia diferente, informe a nuestra oficina a travs de MyChart de [place] o por telfono llamando al [phone] y presione la opcin 4.

## 2023-08-20 NOTE — Progress Notes (Signed)
   Follow-Up Visit   Subjective  Jodi Fernandez is a 68 y.o. female who presents for the following: rash mainly at lower back, bil flank, posterior lower scalp and along waistline. Rash on going since January, has been seen by her PCP twice and prescribed TMC ointment and given a sample of Zoryve to try on lower scalp.  Patient reports legs are itchy but no rash present there. Hx of Covid middle of December. Patient has also been taking half a pill of zyrtec nightly because it makes her drowsy.   The patient has spots, moles and lesions to be evaluated, some may be new or changing and the patient may have concern these could be cancer.   The following portions of the chart were reviewed this encounter and updated as appropriate: medications, allergies, medical history  Review of Systems:  No other skin or systemic complaints except as noted in HPI or Assessment and Plan.  Objective  Well appearing patient in no apparent distress; mood and affect are within normal limits.  A focused examination was performed of the following areas: Back, R flank, L flank, scalp  Relevant exam findings are noted in the Assessment and Plan.           Assessment & Plan    Rash Exam: ill-defined pink blanchable patches on back and flanks. Linear patches associated with scratching on back. No true urticaria. No epidermal change. No lesions in scalp  Differential diagnosis:  Eczema vs dermographism vs other hypersensitivity  Treatment Plan: Patient seen with Dr Annette Barters who helped with evaluation counseling and plan Discussed doing a round of oral prednisone, but discussed can increase blood sugar and patient's A1c is elevated. Discussed if rash returned, would consider other options such as methotrexate. Lack of clinical findings means biopsy would be low yield. Suspect "dermal hypersensitivity reaction" would be the histologic diagnosis  Recommend CeraVe Anti-Itch moisturizer PRN. Use on top of  clobetasol  cream.  continue taking Zyrtec at night.   Given potential side effects of systemic steroids, jointly decided to try clobetasol  0.05% cream BID for 2 weeks to aa, back and sides.   Use clobetasol  0.05% solution to scalp, BID.   Topical steroids (such as triamcinolone , fluocinolone, fluocinonide, mometasone , clobetasol , halobetasol, betamethasone, hydrocortisone) can cause thinning and lightening of the skin if they are used for too long in the same area. Your physician has selected the right strength medicine for your problem and area affected on the body. Please use your medication only as directed by your physician to prevent side effects.   Risks of prednisone taper include mood irritability, insomnia, weight gain, stomach ulcers, increased risk of infection, increased blood sugar (diabetes), hypertension, osteoporosis with long-term or frequent use, and rare risk of avascular necrosis of the hip.     Return in about 2 weeks (around 09/03/2023) for Rash.  I, Jacquelynn Vera, CMA, am acting as scribe for Harris Liming, MD .   Documentation: I have reviewed the above documentation for accuracy and completeness, and I agree with the above.  Harris Liming, MD

## 2023-08-25 ENCOUNTER — Encounter: Payer: Self-pay | Admitting: Dermatology

## 2023-08-28 ENCOUNTER — Ambulatory Visit
Admission: RE | Admit: 2023-08-28 | Discharge: 2023-08-28 | Disposition: A | Discharge status: Home / Self Care | Source: Ambulatory Visit | Attending: Internal Medicine | Admitting: Internal Medicine

## 2023-08-28 DIAGNOSIS — Z1231 Encounter for screening mammogram for malignant neoplasm of breast: Secondary | ICD-10-CM | POA: Insufficient documentation

## 2023-08-31 ENCOUNTER — Encounter: Payer: Self-pay | Admitting: Internal Medicine

## 2023-09-14 ENCOUNTER — Encounter: Payer: Self-pay | Admitting: Family Medicine

## 2023-09-14 ENCOUNTER — Ambulatory Visit: Payer: Self-pay | Admitting: Family Medicine

## 2023-09-14 VITALS — BP 136/82 | HR 92 | Resp 16 | Ht 62.0 in | Wt 142.9 lb

## 2023-09-14 DIAGNOSIS — E785 Hyperlipidemia, unspecified: Secondary | ICD-10-CM | POA: Diagnosis not present

## 2023-09-14 DIAGNOSIS — I10 Essential (primary) hypertension: Secondary | ICD-10-CM

## 2023-09-14 DIAGNOSIS — L309 Dermatitis, unspecified: Secondary | ICD-10-CM

## 2023-09-14 DIAGNOSIS — Z780 Asymptomatic menopausal state: Secondary | ICD-10-CM

## 2023-09-14 DIAGNOSIS — T466X5A Adverse effect of antihyperlipidemic and antiarteriosclerotic drugs, initial encounter: Secondary | ICD-10-CM

## 2023-09-14 DIAGNOSIS — I479 Paroxysmal tachycardia, unspecified: Secondary | ICD-10-CM

## 2023-09-14 DIAGNOSIS — G720 Drug-induced myopathy: Secondary | ICD-10-CM | POA: Diagnosis not present

## 2023-09-14 DIAGNOSIS — R7303 Prediabetes: Secondary | ICD-10-CM | POA: Diagnosis not present

## 2023-09-14 DIAGNOSIS — M8580 Other specified disorders of bone density and structure, unspecified site: Secondary | ICD-10-CM

## 2023-09-14 MED ORDER — EZETIMIBE 10 MG PO TABS: 10.0000 mg | ORAL_TABLET | Freq: Every day | ORAL | 1 refills | Status: AC

## 2023-09-14 MED ORDER — HYDROXYZINE HCL 10 MG PO TABS: 5.0000 mg | ORAL_TABLET | Freq: Every evening | ORAL | 0 refills | Status: AC

## 2023-09-14 MED ORDER — HYDRALAZINE HCL 10 MG PO TABS: 10.0000 mg | ORAL_TABLET | Freq: Three times a day (TID) | ORAL | 0 refills | Status: AC

## 2023-09-14 MED ORDER — TELMISARTAN-HCTZ 40-12.5 MG PO TABS: 1.0000 | ORAL_TABLET | Freq: Every day | ORAL | 1 refills | Status: AC

## 2023-09-14 NOTE — Progress Notes (Signed)
 Name: Jodi Fernandez   MRN: [id number]    DOB: Oct 10, 1955   Date:09/14/2023       Progress Note  Subjective  Chief Complaint  Chief Complaint  Patient presents with   Medical Management of Chronic Issues   Discussed the use of AI scribe software for clinical note transcription with the patient, who gave verbal consent to proceed.  History of Present Illness Jodi Fernandez is a 68 year old female who presents with a persistent rash and itching.  She has been experiencing a persistent rash and itching since January, initially seeking treatment in February and March, and consulting a dermatologist in April. The dermatologist considered eczema, dermatographism, or hypersensitivity as potential diagnoses. She was prescribed clobetasol  0.05% cream and solution for her scalp, which she used as directed.   She has been taking Zyrtec for the itching, but a full dose causes significant drowsiness. She has tried taking it at night, which helps with the itching but still results in morning drowsiness.   The rash and itching have been migrating to different areas of her body, including her lower back, waistline, armpits, chest, and legs. The itching tends to worsen at night.  She mentions a potential link between her symptoms and stress, noting significant stressors in her life, including the recent death of her father-in-law, her husband's role as executor of the estate, and her car breaking down. She acknowledges having experienced anxiety in the past.  She has been using hypoallergenic products and Vaseline on her legs to manage dryness and flakiness, which has helped to some extent. She also mentions trying CeraVe soap and moisturizer, which provided some relief but did not completely resolve the itching.  No current use of prednisone and no recent changes in her medications that could account for her symptoms. No recent palpitations and her blood pressure readings at home have been stable.   White  coat syndrome with hypertension has been well controlled with current regiment. Denies chest pain or palpitation  Prediabetes , last A1C went up to 6.1 % , she denies polyphagia, polydipsia or polyuria, diet has improved since.   Paroxysmal tachycardia, denies any recent episodes of palpitation   Patient Active Problem List   Diagnosis Date Noted   Trochanteric bursitis, left hip 12/13/2021   Pre-diabetes 12/13/2021   White coat syndrome with diagnosis of hypertension 02/05/2019   Osteopenia after menopause 01/22/2019   Conductive hearing loss 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 11/14/2017   Bilateral tinnitus 11/14/2017   Tachycardia, paroxysmal (HCC) 07/25/2016   GAD (generalized anxiety disorder) 11/11/2014   Insomnia, persistent 11/11/2014   Dyspareunia 11/11/2014   Generalized headache 11/11/2014   Bursitis, trochanteric 11/11/2014   History of [place] spotted fever 04/11/2014   Dyslipidemia 10/29/2006   Benign hypertension 10/29/2006   Allergic rhinitis 10/29/2006    Past Surgical History:  Procedure Laterality Date   COLONOSCOPY     TUBAL LIGATION  1987    Family History  Problem Relation Age of Onset   Heart disease Mother    Diabetes Mother    Lesch-Nyhan syndrome Mother    Alcohol abuse Father    Heart disease Father    Heart disease Sister    Other Daughter        positive for lynch syndrome   Other Daughter    Breast cancer Paternal Aunt 56   Breast cancer Cousin    Hypertension Brother    Hypertension Brother    Hypertension  Brother    Hypertension Brother     Social History   Tobacco Use   Smoking status: Never   Smokeless tobacco: Never  Substance Use Topics   Alcohol use: No    Alcohol/week: 0.0 standard drinks of alcohol     Current Outpatient Medications:    Cholecalciferol (D3 ADULT PO), Take 1 tablet by mouth daily., Disp: , Rfl:    clobetasol  (TEMOVATE ) 0.05 % external solution, Apply 1 Application topically 2  (two) times daily. Apply BID to aa, scalp., Disp: 50 mL, Rfl: 2   clobetasol  cream (TEMOVATE ) 0.05 %, Apply 1 Application topically 2 (two) times daily. Use BID to aa., Disp: 60 g, Rfl: 2   Coenzyme Q10 (CO Q 10 PO), Take 300 mg by mouth daily., Disp: , Rfl:    ezetimibe  (ZETIA ) 10 MG tablet, TAKE 1 TABLET BY MOUTH ONCE DAILY, Disp: 90 tablet, Rfl: 1   hydrALAZINE  (APRESOLINE ) 10 MG tablet, Take 1 tablet (10 mg total) by mouth 3 (three) times daily. Prn bp above 150/90, Disp: 30 tablet, Rfl: 0   ibuprofen (ADVIL,MOTRIN) 200 MG tablet, Take 200 mg by mouth every 6 (six) hours as needed. Reported on 11/18/2015, Disp: , Rfl:    OVER THE COUNTER MEDICATION, Immunity support vitamin with elderberry, zinc, vitamin C, Disp: , Rfl:    telmisartan -hydrochlorothiazide (MICARDIS  HCT) 40-12.5 MG tablet, TAKE 1 TABLET BY MOUTH ONCE DAILY, Disp: 90 tablet, Rfl: 1   triamcinolone  ointment (KENALOG ) 0.1 %, Apply 1 Application topically 2 (two) times daily as needed (to affect skin with rash). Not to face or genitals, Disp: 80 g, Rfl: 1  Allergies  Allergen Reactions   Statins     Myalgia     I personally reviewed active problem list, medication list, allergies, family history with the patient/caregiver today.   ROS  Ten systems reviewed and is negative except as mentioned in HPI    Objective Physical Exam Constitutional: Patient appears well-developed and well-nourished. Obese  No distress.  HEENT: head atraumatic, normocephalic, pupils equal and reactive to light, neck supple Cardiovascular: Normal rate, regular rhythm and normal heart sounds.  No murmur heard. No BLE edema. Pulmonary/Chest: Effort normal and breath sounds normal. No respiratory distress. Abdominal: Soft.  There is no tenderness. Psychiatric: Patient has a normal mood and affect. behavior is normal. Judgment and thought content normal.    Vitals:   09/14/23 1004  BP: 136/82  Pulse: 92  Resp: 16  SpO2: 95%  Weight: 142 lb 14.4  oz (64.8 kg)  Height: 5\' 2"  (1.575 m)    Body mass index is 26.14 kg/m.  Recent Results (from the past 2160 hours)  Rickettsia Ab Pnl w/Rfx to Titer     Status: None   Collection Time: 07/31/23 10:57 AM  Result Value Ref Range   RMSF IgG Not Detected Not Detected   RMSF IgM Not Detected Not Detected   Typhus IgG Antibodies Not Detected Not Detected   Typhus IgM Antibodies Not Detected Not Detected     PHQ2/9:    09/14/2023   10:03 AM 07/31/2023    9:45 AM 03/14/2023   10:53 AM 02/01/2023    1:33 PM 09/15/2022    8:52 AM  Depression screen PHQ 2/9  Decreased Interest 0 0 0 0 0  Down, Depressed, Hopeless 0 0 0 0 0  PHQ - 2 Score 0 0 0 0 0  Altered sleeping 0  0  1  Tired, decreased energy 0  0  1  Change in appetite 0  0  0  Feeling bad or failure about yourself  0  0  0  Trouble concentrating 0  0  0  Moving slowly or fidgety/restless 0  0  0  Suicidal thoughts 0  0  0  PHQ-9 Score 0  0  2  Difficult doing work/chores Not difficult at all    Not difficult at all    phq 9 is negative  Fall Risk:    09/14/2023   10:03 AM 07/31/2023    9:45 AM 03/14/2023   10:53 AM 02/01/2023    1:12 PM 09/15/2022    8:52 AM  Fall Risk   Falls in the past year? 0 0 0 0 0  Number falls in past yr: 0 0  0   Injury with Fall? 0 0  0   Risk for fall due to : No Fall Risks No Fall Risks No Fall Risks No Fall Risks No Fall Risks  Follow up Falls prevention discussed;Education provided;Falls evaluation completed Falls evaluation completed Falls prevention discussed Education provided;Falls prevention discussed Falls prevention discussed;Education provided;Falls evaluation completed     Assessment & Plan Hypertension ( white coat syndrome) paroxysmal tachydardia Blood pressure controlled with telmisartan . White coat hypertension noted. Hydralazine  needed for pre-appointment use. - Refill hydralazine  for as-needed use before appointments. - Continue telmisartan  daily. - Advise staying hydrated  during summer months.  Dyslipidemia Cholesterol remains elevated. 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 - Continue ezetimibe  daily.  Prediabetes HbA1c increased to 6.1%. Dietary habits affected by stress and fast food. - Encourage dietary modifications to improve glycemic control.  Anxiety Anxiety contributes to itching and rash. Hydroxyzine recommended for dual action. - Prescribe hydroxyzine to be taken in the evening for anxiety and itching.  Chronic rash and itching Chronic rash and itching since January. Possible eczema, dermatographism, or hypersensitivity. Anxiety may exacerbate symptoms. Hydroxyzine recommended. - Prescribe hydroxyzine for itching and anxiety. - Advise switching to Claritin  if Zyrtec causes drowsiness. - Consider probiotics for gut health. - Advise using hypoallergenic skin and laundry products. - Encourage use of Vaseline and CeraVe for skin hydration. - Advise rescheduling dermatologist appointment to four weeks later to assess response to new medication.  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 High fracture risk. Discussed treatment options, patient opts to wait. - Order bone density scan in December 2025.

## 2023-09-17 ENCOUNTER — Ambulatory Visit: Admitting: Dermatology

## 2023-10-11 ENCOUNTER — Ambulatory Visit (INDEPENDENT_AMBULATORY_CARE_PROVIDER_SITE_OTHER): Admitting: Dermatology

## 2023-10-11 ENCOUNTER — Encounter: Payer: Self-pay | Admitting: Dermatology

## 2023-10-11 DIAGNOSIS — L986 Other infiltrative disorders of the skin and subcutaneous tissue: Secondary | ICD-10-CM

## 2023-10-11 DIAGNOSIS — R21 Rash and other nonspecific skin eruption: Secondary | ICD-10-CM

## 2023-10-11 NOTE — Patient Instructions (Addendum)
 Continue hydroxyzine  10 mg as directed by PCP. Use Triamcinolone  ointment twice a day as needed rash and itching. Avoid applying to face, groin, and axilla. Use as directed. Long-term use can cause thinning of the skin.  Topical steroids (such as triamcinolone , fluocinolone, fluocinonide, mometasone , clobetasol , halobetasol, betamethasone, hydrocortisone) can cause thinning and lightening of the skin if they are used for too long in the same area. Your physician has selected the right strength medicine for your problem and area affected on the body. Please use your medication only as directed by your physician to prevent side effects.         Wound Care Instructions  Cleanse wound gently with soap and water once a day then pat dry with clean gauze. Apply a thin coat of Petrolatum (petroleum jelly, "Vaseline") over the wound (unless you have an allergy to this). We recommend that you use a new, sterile tube of Vaseline. Do not pick or remove scabs. Do not remove the yellow or white "healing tissue" from the base of the wound.  Cover the wound with fresh, clean, nonstick gauze and secure with paper tape. You may use Band-Aids in place of gauze and tape if the wound is small enough, but would recommend trimming much of the tape off as there is often too much. Sometimes Band-Aids can irritate the skin.  You should call the office for your biopsy report after 1 week if you have not already been contacted.  If you experience any problems, such as abnormal amounts of bleeding, swelling, significant bruising, significant pain, or evidence of infection, please call the office immediately.  FOR ADULT SURGERY PATIENTS: If you need something for pain relief you may take 1 extra strength Tylenol  (acetaminophen ) AND 2 Ibuprofen (200mg  each) together every 4 hours as needed for pain. (do not take these if you are allergic to them or if you have a reason you should not take them.) Typically, you may only need  pain medication for 1 to 3 days.      Due to recent changes in healthcare laws, you may see results of your pathology and/or laboratory studies on MyChart before the doctors have had a chance to review them. We understand that in some cases there may be results that are confusing or concerning to you. Please understand that not all results are received at the same time and often the doctors may need to interpret multiple results in order to provide you with the best plan of care or course of treatment. Therefore, we ask that you please give us  2 business days to thoroughly review all your results before contacting the office for clarification. Should we see a critical lab result, you will be contacted sooner.   If You Need Anything After Your Visit  If you have any questions or concerns for your doctor, please call our main line at [phone] and press option 4 to reach your doctor's medical assistant. If no one answers, please leave a voicemail as directed and we will return your call as soon as possible. Messages left after 4 pm will be answered the following business day.   You may also send us  a message via MyChart. We typically respond to MyChart messages within 1-2 business days.  For prescription refills, please ask your pharmacy to contact our office. Our fax number is [phone].  If you have an urgent issue when the clinic is closed that cannot wait until the next business day, you can page your doctor at the  number below.    Please note that while we do our best to be available for urgent issues outside of office hours, we are not available 24/7.   If you have an urgent issue and are unable to reach us , you may choose to seek medical care at your doctor's office, retail clinic, urgent care center, or emergency room.  If you have a medical emergency, please immediately call 911 or go to the emergency department.  Pager Numbers  - Dr. Bary Likes: [phone]  - Dr. Annette Barters:  [phone]  - Dr. Felipe Horton: [phone]   In the event of inclement weather, please call our main line at [phone] for an update on the status of any delays or closures.  Dermatology Medication Tips: Please keep the boxes that topical medications come in in order to help keep track of the instructions about where and how to use these. Pharmacies typically print the medication instructions only on the boxes and not directly on the medication tubes.   If your medication is too expensive, please contact our office at [phone] option 4 or send us  a message through MyChart.   We are unable to tell what your co-pay for medications will be in advance as this is different depending on your insurance coverage. However, we may be able to find a substitute medication at lower cost or fill out paperwork to get insurance to cover a needed medication.   If a prior authorization is required to get your medication covered by your insurance company, please allow us  1-2 business days to complete this process.  Drug prices often vary depending on where the prescription is filled and some pharmacies may offer cheaper prices.  The website www.goodrx.com contains coupons for medications through different pharmacies. The prices here do not account for what the cost may be with help from insurance (it may be cheaper with your insurance), but the website can give you the price if you did not use any insurance.  - You can print the associated coupon and take it with your prescription to the pharmacy.  - You may also stop by our office during regular business hours and pick up a GoodRx coupon card.  - If you need your prescription sent electronically to a different pharmacy, notify our office through [facility] or by phone at [phone] option 4.     Si Usted Necesita Algo Despus de Su Visita  Tambin puede enviarnos un mensaje a travs de Clinical cytogeneticist. Por lo general respondemos a los mensajes de  MyChart en el transcurso de 1 a 2 das hbiles.  Para renovar recetas, por favor pida a su farmacia que se ponga en contacto con nuestra oficina. Franz Jacks de fax es [place] [phone].  Si tiene un asunto urgente cuando la clnica est cerrada y que no puede esperar hasta el siguiente da hbil, puede llamar/localizar a su doctor(a) al nmero que aparece a continuacin.   Por favor, tenga en cuenta que aunque hacemos todo lo posible para estar disponibles para asuntos urgentes fuera del horario de [place], no estamos disponibles las 24 horas del da, los 7 [address]  [address] de la [place].   Si tiene un problema urgente y no puede comunicarse con nosotros, puede optar por buscar atencin mdica  en el consultorio de su doctor(a), en una clnica privada, en un centro de atencin urgente o en una sala de emergencias.  Si tiene Engineer, drilling, por favor llame inmediatamente al 911 o vaya a la sala de emergencias.  Nmeros  de bper  - Dr. Bary Likes: [phone]  - Dra. Annette Barters: [phone]  - Dr. Felipe Horton: [phone]   En caso de inclemencias del tiempo, por favor llame a [place] principal al [phone] para una actualizacin sobre el [place] de cualquier retraso o cierre.  Consejos para la medicacin en dermatologa: Por favor, guarde las cajas en las que vienen los medicamentos de uso tpico para ayudarle a seguir las instrucciones sobre dnde y cmo usarlos. Las farmacias generalmente imprimen las instrucciones del medicamento slo en las cajas y no directamente en los tubos del [place].   Si su medicamento es muy caro, por favor, pngase en contacto con Bettyjane Brunet llamando al [phone] y presione la opcin 4 o envenos un mensaje a travs de Clinical cytogeneticist.   No podemos decirle cul ser su copago por los medicamentos por adelantado ya que esto es diferente dependiendo de la cobertura de su seguro. Sin embargo, es posible que podamos encontrar un medicamento sustituto a Advice worker un formulario para que el seguro cubra el medicamento que se considera necesario.   Si se requiere una autorizacin previa para que su compaa de seguros [place] su medicamento, por favor permtanos de 1 a 2 das hbiles para completar este proceso.  Los precios de los medicamentos varan con frecuencia dependiendo del Environmental consultant de dnde se surte la receta y alguna farmacias pueden ofrecer precios ms baratos.  El sitio web www.goodrx.com tiene cupones para medicamentos de Health and safety inspector. Los precios aqu no tienen en cuenta lo que podra costar con la ayuda del seguro (puede ser ms barato con su seguro), pero el sitio web puede darle el precio si no utiliz Tourist information centre manager.  - Puede imprimir el cupn correspondiente y llevarlo con su receta a la farmacia.  - Tambin puede pasar por nuestra oficina durante el horario de atencin regular y Education officer, museum una tarjeta de cupones de GoodRx.  - Si necesita que su receta se enve electrnicamente a una farmacia diferente, informe a nuestra oficina a travs de MyChart de [place] o por telfono llamando al [phone] y presione la opcin 4.

## 2023-10-11 NOTE — Progress Notes (Signed)
 Follow Up Visit   Subjective  Jodi Fernandez is a 68 y.o. female who presents for the following: Rash follow up. Last visit 08/20/2023.  Affected areas include back, flanks, scalp. States clobetasol  cream seemed to make rash worse. Clobetasol  solution helped scalp. Used CeraVe Anti-itch moisturizer, states Vaseline helps more.  Tried [place] natural herbal topical cream she saw an add for on Facebook, states is made itching/rash worse. Has used frankincense on her body the past couple of nights.  All of this started after COVID in December 2024. Noticed itching first week of January.   Had appointment with PCP regarding BP follow up. She discussed the rash/itching with her. Was prescribed hydroxyzine  10 mg. PCP recommended D/C Zyrtec and start Claritin . PCP also recommended biopsy if hydroxyzine  did not help. She has started back on Triamcinolone  ointment with the hydroxyzine  and states she is sleeping better.  States itching is worse in late afternoon/night time. Some nights are worse than others. Unsure if this is related to food. Has noticed more itching/rash if consuming soft drinks.   Youngest daughter has severe allergies, has to get allergy shots.     The following portions of the chart were reviewed this encounter and updated as appropriate: medications, allergies, medical history  Review of Systems:  No other skin or systemic complaints except as noted in HPI or Assessment and Plan.  Objective  Well appearing patient in no apparent distress; mood and affect are within normal limits.  A focused examination was performed of the following areas: Face, torso, arms  Relevant exam findings are noted in the Assessment and Plan.  Right Abdomen Pink edematous papules coalescing to plaques, some with excoriation, on abdomen back. Patient reports lesions on thighs          Assessment & Plan   RASH Right Abdomen Skin / nail biopsy - Right Abdomen Type of biopsy: punch    Informed consent: discussed and consent obtained   Timeout: patient name, date of birth, surgical site, and procedure verified   Procedure prep:  Patient was prepped and draped in usual sterile fashion Prep type:  Isopropyl alcohol Anesthesia: the lesion was anesthetized in a standard fashion   Anesthetic:  1% lidocaine w/ epinephrine 1-100,000 buffered w/ 8.4% NaHCO3 Punch size:  4 mm Suture size:  4-0 Suture type: Prolene (polypropylene)   Suture removal (days):  7 Hemostasis achieved with: suture, pressure and aluminum chloride   Outcome: patient tolerated procedure well   Post-procedure details: sterile dressing applied and wound care instructions given   Dressing type: bandage and petrolatum   Specimen 1 - Surgical pathology Differential Diagnosis: Eczema vs Grover's vs postviral exanthem  Check Margins: No  Rash Exam:  pink edematous papules coalescing to plaques at B/L flanks, abdomen, back, thighs. Some with excoriation 5-10 % BSA  Chronic and persistent condition with duration or expected duration over one year. Condition is bothersome/symptomatic for patient. Currently flared.  Ddx: Eczema vs Grover's vs postviral exanthem  Atopic dermatitis (eczema) is a chronic, relapsing, pruritic condition that can significantly affect quality of life. It is often associated with allergic rhinitis and/or asthma and can require treatment with topical medications, phototherapy, or in severe cases biologic injectable medication (Dupixent; Adbry) or Oral JAK inhibitors.  Treatment Plan: Punch biopsy Will hold on treatment change until receive pathology results.  Continue hydroxyzine  10 mg as directed by PCP. Use Triamcinolone  ointment twice a day as needed rash and itching. Avoid applying to face, groin, and axilla.  Use as directed. Long-term use can cause thinning of the skin.  Topical steroids (such as triamcinolone , fluocinolone, fluocinonide, mometasone , clobetasol , halobetasol,  betamethasone, hydrocortisone) can cause thinning and lightening of the skin if they are used for too long in the same area. Your physician has selected the right strength medicine for your problem and area affected on the body. Please use your medication only as directed by your physician to prevent side effects.    Recommend gentle skin care.    Return in about 1 week (around 10/18/2023) for Suture Removal.  I, Darcie Easterly, CMA, am acting as scribe for Harris Liming, MD.   Documentation: I have reviewed the above documentation for accuracy and completeness, and I agree with the above.  Harris Liming, MD

## 2023-10-17 LAB — SURGICAL PATHOLOGY

## 2023-10-18 ENCOUNTER — Ambulatory Visit: Payer: Self-pay | Admitting: Dermatology

## 2023-10-18 ENCOUNTER — Ambulatory Visit: Admitting: Dermatology

## 2023-10-18 ENCOUNTER — Encounter: Payer: Self-pay | Admitting: Dermatology

## 2023-10-18 DIAGNOSIS — Z4802 Encounter for removal of sutures: Secondary | ICD-10-CM

## 2023-10-18 DIAGNOSIS — L508 Other urticaria: Secondary | ICD-10-CM

## 2023-10-18 DIAGNOSIS — Z7189 Other specified counseling: Secondary | ICD-10-CM | POA: Diagnosis not present

## 2023-10-18 DIAGNOSIS — Z79899 Other long term (current) drug therapy: Secondary | ICD-10-CM

## 2023-10-18 MED ORDER — DUPIXENT 300 MG/2ML ~~LOC~~ SOAJ: 300.0000 mg | SUBCUTANEOUS | 6 refills | Status: DC

## 2023-10-18 MED ORDER — DUPILUMAB 300 MG/2ML ~~LOC~~ SOAJ
600.0000 mg | Freq: Once | SUBCUTANEOUS | Status: AC
Start: 2023-10-18 — End: 2023-10-18
  Administered 2023-10-18: 600 mg via SUBCUTANEOUS

## 2023-10-18 NOTE — Progress Notes (Signed)
   Follow-Up Visit   Subjective  Jodi Fernandez is a 68 y.o. 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   The following portions of the chart were reviewed this encounter and updated as appropriate: medications, allergies, medical history  Review of Systems:  No other skin or systemic complaints except as noted in HPI or Assessment and Plan.  Objective  Well appearing patient in no apparent distress; mood and affect are within normal limits.   A focused examination was performed of the following areas: Face abdomen  Relevant exam findings are noted in the Assessment and Plan.    Assessment & Plan   CHRONIC URTICARIA 10/11/23 Bx proven Started after COVID 04/2023 Trunk, thighs Exam: pink edematous papules coalescing to plaques at B/L flanks, abdomen, back, thighs. Some with excoriation 5-10 % BSA  Treatment Plan: Encounter for Removal of Sutures - Incision site at the R abdomen is clean, dry and intact - Wound cleansed, sutures removed, wound cleansed and steri strips applied.  - Discussed pathology results showing MILD SUPERFICIAL DERMAL PERIVASCULAR LYMPHOCYTES AND EOSINOPHILIC INFILTRATE  - Patient advised to keep steri-strips dry until they fall off. - Scars remodel for a full year. - Once steri-strips fall off, patient can apply over-the-counter silicone scar cream each night to help with scar remodeling if desired. - Patient advised to call with any concerns or if they notice any new or changing lesions.  Discussed Dupixent Discussed referral to Allergist, pt declines at this time D/C Hydroxyzine  10mg   Start Dupixent today, Dupixent 300mg /59ml  sq injections x 2 today to L and R thigh, Patient instructed on how to self inject and injected her R ant thigh. Samples x 4 given today Lot [id number] exp 11/04/25 Cont TMC 0.1% oint qd/bid aa rash avoid f/g/a  Dupilumab (Dupixent) is a treatment given by injection for adults and children with moderate-to-severe atopic dermatitis. Goal is control of skin condition, not cure. It is given as 2 injections at the first dose followed by 1 injection ever 2 weeks thereafter.  Young children are dosed monthly.  Potential side effects include allergic reaction, herpes infections, injection site reactions and conjunctivitis (inflammation of the eyes).  The use of Dupixent requires long term medication management, including periodic office visits.  Topical steroids (such as triamcinolone , fluocinolone, fluocinonide, mometasone , clobetasol , halobetasol, betamethasone, hydrocortisone) can cause thinning and lightening of the skin if they are used for too long in the same area. Your physician has selected the right strength medicine for your problem and area affected on the body. Please use your medication only as directed by your physician to prevent side effects.   CHRONIC URTICARIA   Related Medications Dupilumab SOAJ 600 mg  LONG-TERM USE OF HIGH-RISK MEDICATION   COUNSELING AND COORDINATION OF CARE   MEDICATION MANAGEMENT   ENCOUNTER FOR REMOVAL OF SUTURES    Return in about 6 weeks (around 11/29/2023) for Chronic Urticaria f/u.  I, Rollie Clipper, RMA, am acting as scribe for Harris Liming, MD .   Documentation: I have reviewed the above documentation for accuracy and completeness, and I agree with the above.  Harris Liming, MD   /

## 2023-10-18 NOTE — Patient Instructions (Addendum)
 Dupixent injections is are every other week Your next injection will be 11/01/23 injecting 1 pen, then 11/15/23 injecting1 pen    Due to recent changes in healthcare laws, you may see results of your pathology and/or laboratory studies on MyChart before the doctors have had a chance to review them. We understand that in some cases there may be results that are confusing or concerning to you. Please understand that not all results are received at the same time and often the doctors may need to interpret multiple results in order to provide you with the best plan of care or course of treatment. Therefore, we ask that you please give us  2 business days to thoroughly review all your results before contacting the office for clarification. Should we see a critical lab result, you will be contacted sooner.   If You Need Anything After Your Visit  If you have any questions or concerns for your doctor, please call our main line at [phone] and press option 4 to reach your doctor's medical assistant. If no one answers, please leave a voicemail as directed and we will return your call as soon as possible. Messages left after 4 pm will be answered the following business day.   You may also send us  a message via MyChart. We typically respond to MyChart messages within 1-2 business days.  For prescription refills, please ask your pharmacy to contact our office. Our fax number is [phone].  If you have an urgent issue when the clinic is closed that cannot wait until the next business day, you can page your doctor at the number below.    Please note that while we do our best to be available for urgent issues outside of office hours, we are not available 24/7.   If you have an urgent issue and are unable to reach us , you may choose to seek medical care at your doctor's office, retail clinic, urgent care center, or emergency room.  If you have a medical emergency, please immediately call 911 or go to the  emergency department.  Pager Numbers  - Dr. Bary Likes: [phone]  - Dr. Annette Barters: [phone]  - Dr. Felipe Horton: [phone]   In the event of inclement weather, please call our main line at [phone] for an update on the status of any delays or closures.  Dermatology Medication Tips: Please keep the boxes that topical medications come in in order to help keep track of the instructions about where and how to use these. Pharmacies typically print the medication instructions only on the boxes and not directly on the medication tubes.   If your medication is too expensive, please contact our office at [phone] option 4 or send us  a message through MyChart.   We are unable to tell what your co-pay for medications will be in advance as this is different depending on your insurance coverage. However, we may be able to find a substitute medication at lower cost or fill out paperwork to get insurance to cover a needed medication.   If a prior authorization is required to get your medication covered by your insurance company, please allow us  1-2 business days to complete this process.  Drug prices often vary depending on where the prescription is filled and some pharmacies may offer cheaper prices.  The website www.goodrx.com contains coupons for medications through different pharmacies. The prices here do not account for what the cost may be with help from insurance (it may be cheaper with your insurance), but the website  can give you the price if you did not use any insurance.  - You can print the associated coupon and take it with your prescription to the pharmacy.  - You may also stop by our office during regular business hours and pick up a GoodRx coupon card.  - If you need your prescription sent electronically to a different pharmacy, notify our office through [facility] or by phone at [phone] option 4.     Si Usted Necesita Algo Despus de Su Visita  Tambin puede  enviarnos un mensaje a travs de Clinical cytogeneticist. Por lo general respondemos a los mensajes de MyChart en el transcurso de 1 a 2 das hbiles.  Para renovar recetas, por favor pida a su farmacia que se ponga en contacto con nuestra oficina. Franz Jacks de fax es [place] [phone].  Si tiene un asunto urgente cuando la clnica est cerrada y que no puede esperar hasta el siguiente da hbil, puede llamar/localizar a su doctor(a) al nmero que aparece a continuacin.   Por favor, tenga en cuenta que aunque hacemos todo lo posible para estar disponibles para asuntos urgentes fuera del horario de [place], no estamos disponibles las 24 horas del da, los 7 [address]  [address] de la [place].   Si tiene un problema urgente y no puede comunicarse con nosotros, puede optar por buscar atencin mdica  en el consultorio de su doctor(a), en una clnica privada, en un centro de atencin urgente o en una sala de emergencias.  Si tiene Engineer, drilling, por favor llame inmediatamente al 911 o vaya a la sala de emergencias.  Nmeros de bper  - Dr. Bary Likes: [phone]  - Dra. Annette Barters: [phone]  - Dr. Felipe Horton: [phone]   En caso de inclemencias del tiempo, por favor llame a Lajuan Pila principal al [phone] para una actualizacin sobre el [place] de cualquier retraso o cierre.  Consejos para la medicacin en dermatologa: Por favor, guarde las cajas en las que vienen los medicamentos de uso tpico para ayudarle a seguir las instrucciones sobre dnde y cmo usarlos. Las farmacias generalmente imprimen las instrucciones del medicamento slo en las cajas y no directamente en los tubos del [place].   Si su medicamento es muy caro, por favor, pngase en contacto con Bettyjane Brunet llamando al [phone] y presione la opcin 4 o envenos un mensaje a travs de Clinical cytogeneticist.   No podemos decirle cul ser su copago por los medicamentos por adelantado ya que esto es diferente dependiendo de la cobertura de su  seguro. Sin embargo, es posible que podamos encontrar un medicamento sustituto a Audiological scientist un formulario para que el seguro cubra el medicamento que se considera necesario.   Si se requiere una autorizacin previa para que su compaa de seguros [place] su medicamento, por favor permtanos de 1 a 2 das hbiles para completar este proceso.  Los precios de los medicamentos varan con frecuencia dependiendo del Environmental consultant de dnde se surte la receta y alguna farmacias pueden ofrecer precios ms baratos.  El sitio web www.goodrx.com tiene cupones para medicamentos de Health and safety inspector. Los precios aqu no tienen en cuenta lo que podra costar con la ayuda del seguro (puede ser ms barato con su seguro), pero el sitio web puede darle el precio si no utiliz Tourist information centre manager.  - Puede imprimir el cupn correspondiente y llevarlo con su receta a la farmacia.  - Tambin puede pasar por nuestra oficina durante el horario de atencin regular y Education officer, museum una tarjeta de cupones de GoodRx.  -  Si necesita que su receta se enve electrnicamente a Psychiatrist, informe a nuestra oficina a travs de MyChart de [place] o por telfono llamando al [phone] y presione la opcin 4.

## 2023-10-22 NOTE — Telephone Encounter (Signed)
 Copied from CRM [id number]. Topic: Clinical - Request for Lab/Test Order >> Oct 22, 2023 10:03 AM Lysbeth Sauger wrote: Reason for CRM: wants to know if doc can order Alpha-Gal syndrome for her because of her rash/itching

## 2023-10-25 ENCOUNTER — Telehealth: Payer: Self-pay

## 2023-10-25 ENCOUNTER — Encounter: Payer: Self-pay | Admitting: Family Medicine

## 2023-10-25 ENCOUNTER — Ambulatory Visit (INDEPENDENT_AMBULATORY_CARE_PROVIDER_SITE_OTHER): Admitting: Family Medicine

## 2023-10-25 VITALS — BP 136/82 | HR 87 | Resp 16 | Ht 62.0 in | Wt 143.4 lb

## 2023-10-25 DIAGNOSIS — L508 Other urticaria: Secondary | ICD-10-CM

## 2023-10-25 NOTE — Progress Notes (Signed)
 Name: Jodi Fernandez   MRN: [id number]    DOB: 1955/12/13   Date:10/25/2023       Progress Note  Subjective  Chief Complaint  Chief Complaint  Patient presents with   Rash    Back/stomach/arms slowly spreading and very itchy    Discussed the use of AI scribe software for clinical note transcription with the patient, who gave verbal consent to proceed.  History of Present Illness Jodi Fernandez is a 68 year old female with chronic urticaria who presents with persistent hives and itching.  She has been experiencing chronic urticaria with persistent hives and itching. A biopsy in June showed superficial dermal perivascular lymphocytes and eosinophilic infiltration. She was diagnosed with chronic urticaria and started on Dupixent  injections, with the first dose of 300 mg administered last Thursday. There has been no significant improvement in symptoms, with the rash and itching remaining unchanged.  She has a history of testing positive for [facility] spotted fever. She is concerned about a possible alpha-gal syndrome, noting increased itching and rash after consuming red meat. She has been bitten by ticks twice recently while mowing, raising suspicion of a tick-related allergy.  Current symptoms include hives and itching affecting her legs, arms, back, and torso. The itching is severe and exacerbated by scratching. Showers sometimes worsen her symptoms.  She is currently taking blood pressure medication, which she took later than usual today due to being busy on the phone.    Patient Active Problem List   Diagnosis Date Noted   Statin myopathy 09/14/2023   Dermatitis 09/14/2023   Trochanteric bursitis, left hip 12/13/2021   Pre-diabetes 12/13/2021   White coat syndrome with diagnosis of hypertension 02/05/2019   Osteopenia after menopause 01/22/2019   Conductive hearing loss 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 11/14/2017   Bilateral tinnitus 11/14/2017   Tachycardia,  paroxysmal (HCC) 07/25/2016   GAD (generalized anxiety disorder) 11/11/2014   Insomnia, persistent 11/11/2014   Dyspareunia 11/11/2014   Generalized headache 11/11/2014   Bursitis, trochanteric 11/11/2014   History of [place] spotted fever 04/11/2014   Dyslipidemia 10/29/2006   Benign hypertension 10/29/2006   Allergic rhinitis 10/29/2006    Social History   Tobacco Use   Smoking status: Never   Smokeless tobacco: Never  Substance Use Topics   Alcohol use: No    Alcohol/week: 0.0 standard drinks of alcohol     Current Outpatient Medications:    Cholecalciferol (D3 ADULT PO), Take 1 tablet by mouth daily., Disp: , Rfl:    clobetasol  (TEMOVATE ) 0.05 % external solution, Apply 1 Application topically 2 (two) times daily. Apply BID to aa, scalp., Disp: 50 mL, Rfl: 2   clobetasol  cream (TEMOVATE ) 0.05 %, Apply 1 Application topically 2 (two) times daily. Use BID to aa., Disp: 60 g, Rfl: 2   Coenzyme Q10 (CO Q 10 PO), Take 300 mg by mouth daily., Disp: , Rfl:    Dupilumab  (DUPIXENT ) 300 MG/2ML SOAJ, Inject 300 mg into the skin every 14 (fourteen) days. Starting at day 15 for maintenance., Disp: 4 mL, Rfl: 6   ezetimibe  (ZETIA ) 10 MG tablet, Take 1 tablet (10 mg total) by mouth daily., Disp: 90 tablet, Rfl: 1   hydrALAZINE  (APRESOLINE ) 10 MG tablet, Take 1 tablet (10 mg total) by mouth 3 (three) times daily. Prn bp above 150/90, Disp: 30 tablet, Rfl: 0   hydrOXYzine  (ATARAX ) 10 MG tablet, Take 0.5-1 tablets (5-10 mg total) by mouth every evening. For itching and  rash, Disp: 90 tablet, Rfl: 0   ibuprofen (ADVIL,MOTRIN) 200 MG tablet, Take 200 mg by mouth every 6 (six) hours as needed. Reported on 11/18/2015, Disp: , Rfl:    loratadine  (CLARITIN ) 10 MG tablet, Take 10 mg by mouth at bedtime., Disp: , Rfl:    OVER THE COUNTER MEDICATION, Immunity support vitamin with elderberry, zinc, vitamin C, Disp: , Rfl:    telmisartan -hydrochlorothiazide (MICARDIS  HCT) 40-12.5 MG tablet, Take 1  tablet by mouth daily., Disp: 90 tablet, Rfl: 1  Allergies  Allergen Reactions   Statins     Myalgia     ROS  Ten systems reviewed and is negative except as mentioned in HPI    Objective  Vitals:   10/25/23 1316  BP: 136/82  Pulse: 87  Resp: 16  SpO2: 95%  Weight: 143 lb 6.4 oz (65 kg)  Height: 5' 2 (1.575 m)    Body mass index is 26.23 kg/m.  Physical Exam CONSTITUTIONAL: Patient appears well-developed and well-nourished. No distress. HEENT: Head atraumatic, normocephalic, neck supple. CARDIOVASCULAR: Normal rate, regular rhythm and normal heart sounds. No murmur heard. No BLE edema. PULMONARY: Effort normal and breath sounds normal. No respiratory distress. ABDOMINAL: There is no tenderness or distention. MUSCULOSKELETAL: Normal gait. Without gross motor or sensory deficit. PSYCHIATRIC: Patient has a normal mood and affect. Behavior is normal. Judgment and thought content normal. SKIN: Urticaria on the lower back., anterior chest and less so on arms, did not get undressed to check legs    Recent Results (from the past 2160 hours)  Rickettsia Ab Pnl w/Rfx to Titer     Status: None   Collection Time: 07/31/23 10:57 AM  Result Value Ref Range   RMSF IgG Not Detected Not Detected   RMSF IgM Not Detected Not Detected   Typhus IgG Antibodies Not Detected Not Detected   Typhus IgM Antibodies Not Detected Not Detected  Surgical pathology     Status: None   Collection Time: 10/11/23 12:00 AM  Result Value Ref Range   SURGICAL PATHOLOGY      SURGICAL PATHOLOGY [facility] [address] [place], [place] [address] Telephone [phone] or [phone] Fax [phone]  REPORT OF DERMATOPATHOLOGY   Accession #: [id number] Patient Name: Jodi Fernandez Visit # : [id number]  MRN: [id number] Cytotechnologist: Beth Brooke, Dermatopathologist, Electronic Signature DOB/Age 15-Feb-1956 (Age: 55) Gender: F Collected Date:  10/11/2023 Received Date: 10/11/2023  FINAL DIAGNOSIS       1. Skin, right abdomen :       MILD SUPERFICIAL DERMAL PERIVASCULAR LYMPHOCYTES AND EOSINOPHILIC INFILTRATE,      SEE NOTE.       DATE SIGNED OUT: 10/17/2023 ELECTRONIC SIGNATURE : Beth Brooke, Dermatopathologist, Electronic Signature  MICROSCOPIC DESCRIPTION 1. The punch biopsy of skin includes epidermis and dermis.  The epidermis is unremarkable.  No acantholysis is found.  There is a sparse superficial dermal perivascular lymphocytic infiltrate with scattered eosinophils.  There is a sug gestion of dermal edema. The deeper dermis and appendages are unremarkable.   No fungal organisms are identified on PAS stain. Note:  The changes suggest urticaria.  An arthropod bite and drug reaction may also be considered.  No acantholysis is found.  (PG:kh 10/17/23)  CASE COMMENTS STAINS USED IN DIAGNOSIS: H&E *RECUT DEEPER X 3 LEVELS Stains used in diagnosis 1 *PAS/F Stain    CLINICAL HISTORY  SPECIMEN(S) OBTAINED 1. Skin, Right Abdomen  SPECIMEN COMMENTS: SPECIMEN CLINICAL INFORMATION: 1. Rash, eczema  vs Grover's    Gross Description 1. Formalin fixed specimen received:  4 X 4 X 3 MM, TOTO (2 P) (1 B) ( ew )        Report signed out from the following location(s) [place] [address] [place], [place] [address] CLIA #: [id number]  [facility] [address] [place], [place] [address] CLIA #: [id number]       Assessment & Plan Chronic urticaria Chronic urticaria with persistent hives and pruritus, unresponsive to Dupixent . Differential includes food allergies and alpha-gal syndrome due to recent tick bites. - Continue Dupixent , next dose June 26. - Order food allergy panel. - Order alpha-gal test. - Consider allergist referral if indicated.  Hypertension Hypertension with current medication regimen. - Continue current antihypertensive regimen.

## 2023-10-25 NOTE — Telephone Encounter (Signed)
 Copied from CRM [id number]. Topic: General - Other >> Oct 25, 2023  9:23 AM Marissa P wrote: Reason for CRM: Patient wondering if doctor Sowles can order some Blood work for alpha gal syndrome because she been dealing with a rash, tried creams and all sorts of stuff, notices when she eats red meat it happens more. Did advise patient that it is a walk in lab

## 2023-10-29 LAB — FOOD ALLERGY PROFILE

## 2023-10-29 LAB — ALPHA-GAL PANEL
Allergen, Mutton, f88: 0.48 kU/L — ABNORMAL HIGH
Allergen, Pork, f26: 0.38 kU/L — ABNORMAL HIGH
Beef: 1.21 kU/L — ABNORMAL HIGH
CLASS: 1
CLASS: 2
Class: 1
GALACTOSE-ALPHA-1,3-GALACTOSE IGE*: 3.07 kU/L — ABNORMAL HIGH (ref <0.10)

## 2023-10-29 LAB — INTERPRETATION:

## 2023-10-30 ENCOUNTER — Ambulatory Visit: Payer: Self-pay | Admitting: Family Medicine

## 2023-10-30 ENCOUNTER — Telehealth: Payer: Self-pay

## 2023-10-30 NOTE — Telephone Encounter (Signed)
 Patient called today concerning her Dupixent  injection, she states she is unsure on whether she should continue treatment. She has not seen any changes in rash and is still itchy after her first injection.   Reports that she was recently tested by her primary care doctor for Alpha Gal Syndrome  and tested positive. Patient is concerned that this injection may interfere with condition. Would like to know should she continue Dupixent  injections and would like to know if this will help with her Alpha Gal.  Routing to provider to advise

## 2023-11-05 ENCOUNTER — Other Ambulatory Visit: Payer: Self-pay | Admitting: Dermatology

## 2023-11-06 ENCOUNTER — Other Ambulatory Visit: Payer: Self-pay | Admitting: Nurse Practitioner

## 2023-11-06 DIAGNOSIS — Z91018 Allergy to other foods: Secondary | ICD-10-CM

## 2023-11-06 MED ORDER — EPINEPHRINE 0.3 MG/0.3ML IJ SOAJ: 0.3000 mg | Freq: Once | INTRAMUSCULAR | 1 refills | Status: AC | PRN

## 2023-11-29 ENCOUNTER — Ambulatory Visit: Admitting: Dermatology

## 2024-02-07 ENCOUNTER — Ambulatory Visit: Payer: Medicare HMO

## 2024-02-07 DIAGNOSIS — Z0000 Encounter for general adult medical examination without abnormal findings: Secondary | ICD-10-CM | POA: Diagnosis not present

## 2024-02-07 NOTE — Patient Instructions (Addendum)
 [name],  Thank you for taking the time for your Medicare Wellness Visit. I appreciate your continued commitment to your health goals. Please review the care plan we discussed, and feel free to reach out if I can assist you further.  Medicare recommends these wellness visits once per year to help you and your care team stay ahead of potential health issues. These visits are designed to focus on prevention, allowing your provider to concentrate on managing your acute and chronic conditions during your regular appointments.  Please note that Annual Wellness Visits do not include a physical exam. Some assessments may be limited, especially if the visit was conducted virtually. If needed, we may recommend a separate in-person follow-up with your provider.  Ongoing Care Seeing your primary care provider every 3 to 6 months helps us  monitor your health and provide consistent, personalized care.   Referrals If a referral was made during today's visit and you haven't received any updates within two weeks, please contact the referred provider directly to check on the status.  Recommended Screenings:  Health Maintenance  Topic Date Due   COVID-19 Vaccine (3 - Pfizer risk series) 08/13/2019   Flu Shot  12/07/2023   Breast Cancer Screening  08/27/2024   Medicare Annual Wellness Visit  02/06/2025   DEXA scan (bone density measurement)  04/06/2025   Colon Cancer Screening  08/12/2026   DTaP/Tdap/Td vaccine (3 - Td or Tdap) 06/06/2028   Pneumococcal Vaccine for age over 44  Completed   Hepatitis C Screening  Completed   Zoster (Shingles) Vaccine  Completed   HPV Vaccine  Aged Out   Meningitis B Vaccine  Aged Out     Advance Care Planning is important because it: Ensures you receive medical care that aligns with your values, goals, and preferences. Provides guidance to your family and loved ones, reducing the emotional burden of decision-making during critical moments.  Vision: Annual vision  screenings are recommended for early detection of glaucoma, cataracts, and diabetic retinopathy. These exams can also reveal signs of chronic conditions such as diabetes and high blood pressure.  Dental: Annual dental screenings help detect early signs of oral cancer, gum disease, and other conditions linked to overall health, including heart disease and diabetes.  Please see the attached documents for additional preventive care recommendations.   NEXT AWV 02/12/25 @ 11:30 AM BY PHONE

## 2024-02-07 NOTE — Progress Notes (Signed)
 Subjective:   Jodi Fernandez is a 68 y.o. who presents for a Medicare Wellness preventive visit.  As a reminder, Annual Wellness Visits don't include a physical exam, and some assessments may be limited, especially if this visit is performed virtually. We may recommend an in-person follow-up visit with your provider if needed.  Visit Complete: Virtual I connected with  Jodi Fernandez on 02/07/24 by a audio enabled telemedicine application and verified that I am speaking with the correct person using two identifiers.  Patient Location: Home  Provider Location: Home Office  I discussed the limitations of evaluation and management by telemedicine. The patient expressed understanding and agreed to proceed.  Vital Signs: Because this visit was a virtual/telehealth visit, some criteria may be missing or patient reported. Any vitals not documented were not able to be obtained and vitals that have been documented are patient reported.  VideoDeclined- This patient declined Librarian, academic. Therefore the visit was completed with audio only.  Persons Participating in Visit: Patient.  AWV Questionnaire: No: Patient Medicare AWV questionnaire was not completed prior to this visit.  Cardiac Risk Factors include: advanced age (>79men, >51 women);dyslipidemia;hypertension;sedentary lifestyle     Objective:    There were no vitals filed for this visit. There is no height or weight on file to calculate BMI.     02/07/2024    1:31 PM 02/01/2023    1:24 PM 06/09/2022    4:12 PM 01/20/2021   11:00 AM 01/20/2020   10:54 AM 02/20/2017    9:48 AM 11/20/2016    9:22 AM  Advanced Directives  Does Patient Have a Medical Advance Directive? No No No No No No  No   Would patient like information on creating a medical advance directive? No - Patient declined   No - Patient declined Yes (MAU/Ambulatory/Procedural Areas - Information given)       Data saved with a previous  flowsheet row definition    Current Medications (verified) Outpatient Encounter Medications as of 02/07/2024  Medication Sig   Cholecalciferol (D3 ADULT PO) Take 1 tablet by mouth daily.   clobetasol  (TEMOVATE ) 0.05 % external solution Apply 1 Application topically 2 (two) times daily. Apply BID to aa, scalp.   clobetasol  cream (TEMOVATE ) 0.05 % Apply 1 Application topically 2 (two) times daily. Use BID to aa.   Coenzyme Q10 (CO Q 10 PO) Take 300 mg by mouth daily.   EPINEPHrine  (EPIPEN  2-PAK) 0.3 mg/0.3 mL IJ SOAJ injection Inject 0.3 mg into the muscle once as needed (for severe allergic reaction). CAll 911 immediately if you have to use this medicine   ezetimibe  (ZETIA ) 10 MG tablet Take 1 tablet (10 mg total) by mouth daily.   hydrALAZINE  (APRESOLINE ) 10 MG tablet Take 1 tablet (10 mg total) by mouth 3 (three) times daily. Prn bp above 150/90   ibuprofen (ADVIL,MOTRIN) 200 MG tablet Take 200 mg by mouth every 6 (six) hours as needed. Reported on 11/18/2015   OVER THE COUNTER MEDICATION Immunity support vitamin with elderberry, zinc, vitamin C (Patient taking differently: Immunity support vitamin with elderberry, zinc, vitamin C  TAKES PRN)   telmisartan -hydrochlorothiazide (MICARDIS  HCT) 40-12.5 MG tablet Take 1 tablet by mouth daily.   hydrOXYzine  (ATARAX ) 10 MG tablet Take 0.5-1 tablets (5-10 mg total) by mouth every evening. For itching and rash (Patient not taking: Reported on 02/07/2024)   loratadine  (CLARITIN ) 10 MG tablet Take 10 mg by mouth at bedtime. (Patient not taking: Reported on 02/07/2024)  No facility-administered encounter medications on file as of 02/07/2024.    Allergies (verified) Statins   History: Past Medical History:  Diagnosis Date   Abnormal glucose    Allergy     Generalized headache    Hemorrhoids    Hyperlipidemia    Hypertension    Insomnia    [place] spotted fever    Ventricular tachycardia (HCC) 08/11/2016   received docum[facility] from [facility] (Dr.  Almarie Mall)   Past Surgical History:  Procedure Laterality Date   COLONOSCOPY     TUBAL LIGATION  1987   Family History  Problem Relation Age of Onset   Heart disease Mother    Diabetes Mother    Lesch-Nyhan syndrome Mother    Alcohol abuse Father    Heart disease Father    Heart disease Sister    Other Daughter        positive for lynch syndrome   Other Daughter    Breast cancer Paternal Aunt 54   Breast cancer Cousin    Hypertension Brother    Hypertension Brother    Hypertension Brother    Hypertension Brother    Social History   Socioeconomic History   Marital status: Married    Spouse name: Marcey   Number of children: 2   Years of education: Not on file   Highest education level: Associate degree: occupational, Scientist, product/process development, or vocational program  Occupational History   Occupation: retired    Comment: Geologist, engineering   Occupation: Lawyer  Tobacco Use   Smoking status: Never   Smokeless tobacco: Never  Vaping Use   Vaping status: Never Used  Substance and Sexual Activity   Alcohol use: No    Alcohol/week: 0.0 standard drinks of alcohol   Drug use: No   Sexual activity: Yes    Partners: Female  Other Topics Concern   Not on file  Social History Narrative   Married   Retired Geologist, 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    Husband is a Chartered loss adjuster, works on cars, but does not seem to care about their house   Social Drivers of Corporate investment banker Strain: Low Risk  (02/07/2024)   Overall Financial Resource Strain (CARDIA)    Difficulty of Paying Living Expenses: Not hard at all  Food Insecurity: No Food Insecurity (02/07/2024)   Hunger Vital Sign    Worried About Running Out of Food in the Last Year: Never true    Ran Out of Food in the Last Year: Never true  Transportation Needs: No  Transportation Needs (02/07/2024)   PRAPARE - Administrator, Civil Service (Medical): No    Lack of Transportation (Non-Medical): No  Physical Activity: Inactive (02/07/2024)   Exercise Vital Sign    Days of Exercise per Week: 0 days    Minutes of Exercise per Session: 0 min  Stress: No Stress Concern Present (02/07/2024)   [employer] of Occupational Health - Occupational Stress Questionnaire    Feeling of Stress: Not at all  Social Connections: Socially Integrated (02/07/2024)   Social Connection and Isolation Panel    Frequency of Communication with Friends and Family: More than three times a week    Frequency of Social Gatherings with Friends and Family: Three times a week    Attends Religious Services: More than 4  times per year    Active Member of Clubs or Organizations: Yes    Attends Banker Meetings: More than 4 times per year    Marital Status: Married    Tobacco Counseling Counseling given: Not Answered    Clinical Intake:  Pre-visit preparation completed: Yes  Pain : No/denies pain     BMI - recorded: 26.2 Nutritional Status: BMI 25 -29 Overweight Nutritional Risks: None Diabetes: No  Lab Results  Component Value Date   HGBA1C 6.1 (H) 03/14/2023   HGBA1C 5.8 (H) 12/13/2021   HGBA1C 5.7 (H) 11/16/2020     How often do you need to have someone help you when you read instructions, pamphlets, or other written materials from your doctor or pharmacy?: 1 - Never  Interpreter Needed?: No  Information entered by :: JHONNIE DAS, LPN   Activities of Daily Living    02/07/2024    1:34 PM 07/31/2023    9:45 AM  In your present state of health, do you have any difficulty performing the following activities:  Hearing? 1 0  Vision? 0 0  Difficulty concentrating or making decisions? 0 0  Walking or climbing stairs? 0 0  Dressing or bathing? 0 0  Doing errands, shopping? 0 0  Preparing Food and eating ? N   Using the Toilet? N    In the past six months, have you accidently leaked urine? N   Do you have problems with loss of bowel control? N   Managing your Medications? N   Managing your Finances? N   Housekeeping or managing your Housekeeping? N     Patient Care Team: Sowles, Krichna, MD as PCP - General (Family Medicine) Dellie Louanne MATSU, MD (General Surgery) Colvin Norris, MD as Consulting Physician (Cardiology) Herminio Miu, MD (Otolaryngology) Charlean, Toribio Elbe, MD as Referring Physician (Gastroenterology) Mevelyn JONETTA Bathe, OD (Optometry)  I have updated your Care Teams any recent Medical Services you may have received from other providers in the past year.     Assessment:   This is a routine wellness examination for Jodi Fernandez.  Hearing/Vision screen Hearing Screening - Comments:: HAS SOME HEARING LOSS, HAS TINNITUS, MD DIDN'T RECOMMEND AIDS  Vision Screening - Comments:: [facility]- [facility]   Goals Addressed             This Visit's Progress    DIET - EAT MORE FRUITS AND VEGETABLES         Depression Screen     02/07/2024    1:28 PM 10/25/2023    1:11 PM 09/14/2023   10:03 AM 07/31/2023    9:45 AM 03/14/2023   10:53 AM 02/01/2023    1:33 PM 09/15/2022    8:52 AM  PHQ 2/9 Scores  PHQ - 2 Score 0 0 0 0 0 0 0  PHQ- 9 Score 0 0 0  0  2    Fall Risk     02/07/2024    1:34 PM 10/25/2023    1:11 PM 09/14/2023   10:03 AM 07/31/2023    9:45 AM 03/14/2023   10:53 AM  Fall Risk   Falls in the past year? 0 0 0 0 0  Number falls in past yr: 0 0 0 0   Injury with Fall? 0 0 0 0   Risk for fall due to : No Fall Risks No Fall Risks No Fall Risks No Fall Risks No Fall Risks  Follow up Falls evaluation completed;Falls prevention discussed Falls prevention discussed;Education provided;Falls evaluation  completed Falls prevention discussed;Education provided;Falls evaluation completed Falls evaluation completed Falls prevention discussed    MEDICARE RISK AT HOME:  Medicare Risk at Home Any  stairs in or around the home?: Yes If so, are there any without handrails?: Yes Home free of loose throw rugs in walkways, pet beds, electrical cords, etc?: Yes Adequate lighting in your home to reduce risk of falls?: Yes Life alert?: No Use of a cane, walker or w/c?: No Grab bars in the bathroom?: No Shower chair or bench in shower?: No Elevated toilet seat or a handicapped toilet?: No  TIMED UP AND GO:  Was the test performed?  No  Cognitive Function: 6CIT completed        02/07/2024    1:37 PM 02/01/2023    1:29 PM 01/24/2022   10:39 AM  6CIT Screen  What Year? 0 points 0 points 0 points  What month? 0 points 0 points 0 points  What time? 0 points 0 points 0 points  Count back from 20 0 points 0 points 0 points  Months in reverse 0 points 0 points 0 points  Repeat phrase 0 points 0 points 0 points  Total Score 0 points 0 points 0 points    Immunizations Immunization History  Administered Date(s) Administered   Fluad Quad(high Dose 65+) 02/05/2019, 05/10/2020   Fluad Trivalent(High Dose 65+) 03/14/2023   Influenza,inj,Quad PF,6+ Mos 02/20/2013, 03/03/2014, 02/15/2015, 02/20/2017, 02/19/2018   Influenza-Unspecified 03/03/2014   PFIZER(Purple Top)SARS-COV-2 Vaccination 06/19/2019, 07/16/2019   Pneumococcal Conjugate-13 12/24/2018   Pneumococcal Polysaccharide-23 01/20/2020   Tdap 05/08/2010, 06/06/2018   Zoster Recombinant(Shingrix ) 06/06/2018, 12/24/2018   Zoster, Live 02/15/2015    Screening Tests Health Maintenance  Topic Date Due   COVID-19 Vaccine (3 - Pfizer risk series) 08/13/2019   Influenza Vaccine  12/07/2023   Mammogram  08/27/2024   Medicare Annual Wellness (AWV)  02/06/2025   DEXA SCAN  04/06/2025   Colonoscopy  08/12/2026   DTaP/Tdap/Td (3 - Td or Tdap) 06/06/2028   Pneumococcal Vaccine: 50+ Years  Completed   Hepatitis C Screening  Completed   Zoster Vaccines- Shingrix   Completed   HPV VACCINES  Aged Out   Meningococcal B Vaccine  Aged Out     Health Maintenance Items Addressed: UP TO DATE ON SHOTS, WANTS NO MORE COVIDS; MAMMOGRAM & COLONOSCOPY UP TO DATE- HAS ORDER FOR BDS  Additional Screening:  Vision Screening: Recommended annual ophthalmology exams for early detection of glaucoma and other disorders of the eye. Is the patient up to date with their annual eye exam?  No  Who is the provider or what is the name of the office in which the patient attends annual eye exams? [facility]  Dental Screening: Recommended annual dental exams for proper oral hygiene  Community Resource Referral / Chronic Care Management: CRR required this visit?  No   CCM required this visit?  No   Plan:    I have personally reviewed and noted the following in the patient's chart:   Medical and social history Use of alcohol, tobacco or illicit drugs  Current medications and supplements including opioid prescriptions. Patient is not currently taking opioid prescriptions. Functional ability and status Nutritional status Physical activity Advanced directives List of other physicians Hospitalizations, surgeries, and ER visits in previous 12 months Vitals Screenings to include cognitive, depression, and falls Referrals and appointments  In addition, I have reviewed and discussed with patient certain preventive protocols, quality metrics, and best practice recommendations. A written personalized care plan for preventive  services as well as general preventive health recommendations were provided to patient.   Jhonnie GORMAN Das, LPN   89/11/7972   After Visit Summary: (MyChart) Due to this being a telephonic visit, the after visit summary with patients personalized plan was offered to patient via MyChart   Notes: Nothing significant to report at this time.

## 2024-03-25 ENCOUNTER — Other Ambulatory Visit: Payer: Self-pay | Admitting: Family Medicine

## 2024-03-25 DIAGNOSIS — I10 Essential (primary) hypertension: Secondary | ICD-10-CM

## 2024-03-27 NOTE — Telephone Encounter (Signed)
 Requested medications are due for refill today.  yes  Requested medications are on the active medications list.  yes  Last refill. 09/14/2023 #90 1 rf  Future visit scheduled.   Middle of next year.   Notes to clinic.  Labs are expired.    Requested Prescriptions  Pending Prescriptions Disp Refills   telmisartan -hydrochlorothiazide (MICARDIS  HCT) 40-12.5 MG tablet [Pharmacy Med Name: TELMISARTAN -HCTZ 40-12.5 MG TAB] 90 tablet 1    Sig: TAKE 1 TABLET BY MOUTH ONCE DAILY     Cardiovascular: ARB + Diuretic Combos Failed - 03/27/2024  5:39 PM      Failed - K in normal range and within 180 days    Potassium  Date Value Ref Range Status  03/14/2023 3.9 3.5 - 5.3 mmol/L Final         Failed - Na in normal range and within 180 days    Sodium  Date Value Ref Range Status  03/14/2023 140 135 - 146 mmol/L Final  11/12/2014 142 134 - 144 mmol/L Final         Failed - Cr in normal range and within 180 days    Creat  Date Value Ref Range Status  03/14/2023 0.72 0.50 - 1.05 mg/dL Final   Creatinine, Urine  Date Value Ref Range Status  12/24/2018 31 20 - 275 mg/dL Final         Failed - eGFR is 10 or above and within 180 days    GFR, Est African American  Date Value Ref Range Status  11/05/2019 100 > OR = 60 mL/min/1.65m2 Final   GFR, Est Non African American  Date Value Ref Range Status  11/05/2019 86 > OR = 60 mL/min/1.26m2 Final   GFR, Estimated  Date Value Ref Range Status  06/09/2022 >60 >60 mL/min Final    Comment:    (NOTE) Calculated using the CKD-EPI Creatinine Equation (2021)    eGFR  Date Value Ref Range Status  03/14/2023 90 > OR = 60 mL/min/1.58m2 Final         Passed - Patient is not pregnant      Passed - Last BP in normal range    BP Readings from Last 1 Encounters:  10/25/23 136/82         Passed - Valid encounter within last 6 months    Recent Outpatient Visits           5 months ago Chronic urticaria   [facility]  [place], D[place], MD   6 months ago White coat syndrome with diagnosis of hypertension   [facility] Glenard Dorette, MD   8 months ago Rash and nonspecific skin eruption   [facility] Bernardo Fend, DO   9 months ago Rash and nonspecific skin eruption   [facility] Leavy Mole, PA-C

## 2024-03-28 NOTE — Telephone Encounter (Signed)
 Pt overdue for a follow up

## 2024-03-28 NOTE — Telephone Encounter (Signed)
 Pt just picked up this prescription and has a 90 day supply on it. Therefore she has enough to last until her appt in Feb

## 2024-03-28 NOTE — Telephone Encounter (Signed)
 Appt sch'd for Feb

## 2024-06-17 ENCOUNTER — Ambulatory Visit: Admitting: Family Medicine

## 2025-02-12 ENCOUNTER — Ambulatory Visit
